# Patient Record
Sex: Female | Born: 1955
Health system: Southern US, Community
[De-identification: ages and names within clinical notes are randomized; demographics above are authoritative.]

## PROBLEM LIST (undated history)

## (undated) DIAGNOSIS — A048 Other specified bacterial intestinal infections: Secondary | ICD-10-CM

## (undated) DIAGNOSIS — K219 Gastro-esophageal reflux disease without esophagitis: Secondary | ICD-10-CM

## (undated) DIAGNOSIS — D689 Coagulation defect, unspecified: Secondary | ICD-10-CM

## (undated) DIAGNOSIS — Z8719 Personal history of other diseases of the digestive system: Secondary | ICD-10-CM

## (undated) DIAGNOSIS — H269 Unspecified cataract: Secondary | ICD-10-CM

## (undated) DIAGNOSIS — IMO0002 Reserved for concepts with insufficient information to code with codable children: Secondary | ICD-10-CM

## (undated) DIAGNOSIS — T7840XA Allergy, unspecified, initial encounter: Secondary | ICD-10-CM

## (undated) DIAGNOSIS — F32A Depression, unspecified: Secondary | ICD-10-CM

## (undated) DIAGNOSIS — H04129 Dry eye syndrome of unspecified lacrimal gland: Secondary | ICD-10-CM

## (undated) DIAGNOSIS — R7303 Prediabetes: Secondary | ICD-10-CM

## (undated) DIAGNOSIS — M1990 Unspecified osteoarthritis, unspecified site: Secondary | ICD-10-CM

## (undated) DIAGNOSIS — D649 Anemia, unspecified: Secondary | ICD-10-CM

## (undated) DIAGNOSIS — E039 Hypothyroidism, unspecified: Secondary | ICD-10-CM

## (undated) DIAGNOSIS — J189 Pneumonia, unspecified organism: Secondary | ICD-10-CM

## (undated) HISTORY — PX: ABDOMINAL HYSTERECTOMY: SHX81

## (undated) HISTORY — DX: Hypothyroidism, unspecified: E03.9

## (undated) HISTORY — PX: JOINT REPLACEMENT: SHX530

## (undated) HISTORY — DX: Reserved for concepts with insufficient information to code with codable children: IMO0002

## (undated) HISTORY — DX: Unspecified cataract: H26.9

## (undated) HISTORY — PX: COLONOSCOPY: SHX174

## (undated) HISTORY — DX: Allergy, unspecified, initial encounter: T78.40XA

## (undated) HISTORY — DX: Coagulation defect, unspecified: D68.9

## (undated) HISTORY — PX: EYE SURGERY: SHX253

## (undated) HISTORY — PX: REFRACTIVE SURGERY: SHX103

## (undated) LAB — TSH: TSH: 0.92 (ref 0.41–5.90)

---

## 1997-11-14 ENCOUNTER — Encounter: Payer: Self-pay | Admitting: *Deleted

## 1997-11-14 ENCOUNTER — Ambulatory Visit (HOSPITAL_COMMUNITY): Admission: RE | Admit: 1997-11-14 | Discharge: 1997-11-14 | Payer: Self-pay | Admitting: *Deleted

## 1998-06-27 ENCOUNTER — Encounter: Payer: Self-pay | Admitting: Family Medicine

## 1998-06-27 ENCOUNTER — Ambulatory Visit (HOSPITAL_COMMUNITY): Admission: RE | Admit: 1998-06-27 | Discharge: 1998-06-27 | Payer: Self-pay | Admitting: Family Medicine

## 1998-08-07 ENCOUNTER — Other Ambulatory Visit: Admission: RE | Admit: 1998-08-07 | Discharge: 1998-08-07 | Payer: Self-pay | Admitting: *Deleted

## 1998-08-11 ENCOUNTER — Emergency Department (HOSPITAL_COMMUNITY): Admission: EM | Admit: 1998-08-11 | Discharge: 1998-08-11 | Payer: Self-pay | Admitting: Emergency Medicine

## 1998-11-26 ENCOUNTER — Ambulatory Visit (HOSPITAL_COMMUNITY): Admission: RE | Admit: 1998-11-26 | Discharge: 1998-11-26 | Payer: Self-pay | Admitting: *Deleted

## 1998-11-26 ENCOUNTER — Encounter: Payer: Self-pay | Admitting: *Deleted

## 1999-09-12 ENCOUNTER — Other Ambulatory Visit: Admission: RE | Admit: 1999-09-12 | Discharge: 1999-09-12 | Payer: Self-pay | Admitting: *Deleted

## 2000-09-24 ENCOUNTER — Other Ambulatory Visit: Admission: RE | Admit: 2000-09-24 | Discharge: 2000-09-24 | Payer: Self-pay | Admitting: *Deleted

## 2000-12-31 ENCOUNTER — Ambulatory Visit (HOSPITAL_COMMUNITY): Admission: RE | Admit: 2000-12-31 | Discharge: 2000-12-31 | Payer: Self-pay | Admitting: *Deleted

## 2001-10-19 ENCOUNTER — Other Ambulatory Visit: Admission: RE | Admit: 2001-10-19 | Discharge: 2001-10-19 | Payer: Self-pay | Admitting: *Deleted

## 2002-11-15 ENCOUNTER — Other Ambulatory Visit: Admission: RE | Admit: 2002-11-15 | Discharge: 2002-11-15 | Payer: Self-pay | Admitting: Obstetrics & Gynecology

## 2004-02-04 ENCOUNTER — Other Ambulatory Visit: Admission: RE | Admit: 2004-02-04 | Discharge: 2004-02-04 | Payer: Self-pay | Admitting: Obstetrics and Gynecology

## 2005-03-17 ENCOUNTER — Other Ambulatory Visit: Admission: RE | Admit: 2005-03-17 | Discharge: 2005-03-17 | Payer: Self-pay | Admitting: Obstetrics and Gynecology

## 2005-04-08 ENCOUNTER — Encounter: Admission: RE | Admit: 2005-04-08 | Discharge: 2005-04-08 | Payer: Self-pay | Admitting: Obstetrics and Gynecology

## 2007-05-11 ENCOUNTER — Encounter: Admission: RE | Admit: 2007-05-11 | Discharge: 2007-05-11 | Payer: Self-pay | Admitting: Obstetrics and Gynecology

## 2008-05-14 ENCOUNTER — Encounter: Admission: RE | Admit: 2008-05-14 | Discharge: 2008-05-14 | Payer: Self-pay | Admitting: Obstetrics and Gynecology

## 2009-02-11 DIAGNOSIS — N951 Menopausal and female climacteric states: Secondary | ICD-10-CM | POA: Insufficient documentation

## 2010-05-07 DIAGNOSIS — L719 Rosacea, unspecified: Secondary | ICD-10-CM | POA: Insufficient documentation

## 2010-05-07 DIAGNOSIS — E039 Hypothyroidism, unspecified: Secondary | ICD-10-CM | POA: Insufficient documentation

## 2010-05-07 DIAGNOSIS — M3500 Sicca syndrome, unspecified: Secondary | ICD-10-CM | POA: Insufficient documentation

## 2010-05-07 DIAGNOSIS — M8580 Other specified disorders of bone density and structure, unspecified site: Secondary | ICD-10-CM | POA: Insufficient documentation

## 2011-08-12 ENCOUNTER — Encounter (INDEPENDENT_AMBULATORY_CARE_PROVIDER_SITE_OTHER): Payer: BC Managed Care – PPO | Admitting: Ophthalmology

## 2011-08-12 DIAGNOSIS — Z09 Encounter for follow-up examination after completed treatment for conditions other than malignant neoplasm: Secondary | ICD-10-CM

## 2011-08-12 DIAGNOSIS — H43819 Vitreous degeneration, unspecified eye: Secondary | ICD-10-CM

## 2011-08-12 DIAGNOSIS — H2510 Age-related nuclear cataract, unspecified eye: Secondary | ICD-10-CM

## 2012-08-15 ENCOUNTER — Ambulatory Visit (INDEPENDENT_AMBULATORY_CARE_PROVIDER_SITE_OTHER): Payer: BC Managed Care – PPO | Admitting: Ophthalmology

## 2012-08-29 DIAGNOSIS — M1711 Unilateral primary osteoarthritis, right knee: Secondary | ICD-10-CM | POA: Diagnosis present

## 2012-08-29 DIAGNOSIS — M1710 Unilateral primary osteoarthritis, unspecified knee: Secondary | ICD-10-CM | POA: Insufficient documentation

## 2012-09-01 ENCOUNTER — Ambulatory Visit (INDEPENDENT_AMBULATORY_CARE_PROVIDER_SITE_OTHER): Payer: BC Managed Care – PPO | Admitting: Ophthalmology

## 2012-09-01 DIAGNOSIS — H43819 Vitreous degeneration, unspecified eye: Secondary | ICD-10-CM

## 2012-09-01 DIAGNOSIS — Z09 Encounter for follow-up examination after completed treatment for conditions other than malignant neoplasm: Secondary | ICD-10-CM

## 2013-08-29 DIAGNOSIS — R600 Localized edema: Secondary | ICD-10-CM | POA: Insufficient documentation

## 2013-09-04 ENCOUNTER — Ambulatory Visit (INDEPENDENT_AMBULATORY_CARE_PROVIDER_SITE_OTHER): Payer: BC Managed Care – PPO | Admitting: Ophthalmology

## 2013-09-14 ENCOUNTER — Ambulatory Visit (INDEPENDENT_AMBULATORY_CARE_PROVIDER_SITE_OTHER): Payer: BC Managed Care – PPO | Admitting: Ophthalmology

## 2013-09-14 DIAGNOSIS — H2510 Age-related nuclear cataract, unspecified eye: Secondary | ICD-10-CM

## 2013-09-14 DIAGNOSIS — H43819 Vitreous degeneration, unspecified eye: Secondary | ICD-10-CM

## 2013-09-14 DIAGNOSIS — Z09 Encounter for follow-up examination after completed treatment for conditions other than malignant neoplasm: Secondary | ICD-10-CM

## 2014-01-12 HISTORY — PX: COLONOSCOPY: SHX174

## 2014-08-31 DIAGNOSIS — Z800 Family history of malignant neoplasm of digestive organs: Secondary | ICD-10-CM | POA: Insufficient documentation

## 2014-09-18 ENCOUNTER — Ambulatory Visit (INDEPENDENT_AMBULATORY_CARE_PROVIDER_SITE_OTHER): Payer: BC Managed Care – PPO | Admitting: Ophthalmology

## 2014-10-16 ENCOUNTER — Ambulatory Visit (INDEPENDENT_AMBULATORY_CARE_PROVIDER_SITE_OTHER): Payer: 59 | Admitting: Ophthalmology

## 2014-10-16 DIAGNOSIS — H2513 Age-related nuclear cataract, bilateral: Secondary | ICD-10-CM | POA: Diagnosis not present

## 2014-10-16 DIAGNOSIS — H43813 Vitreous degeneration, bilateral: Secondary | ICD-10-CM

## 2014-10-16 DIAGNOSIS — M3501 Sicca syndrome with keratoconjunctivitis: Secondary | ICD-10-CM | POA: Diagnosis not present

## 2015-02-14 LAB — POCT ERYTHROCYTE SEDIMENTATION RATE, NON-AUTOMATED: Sed Rate: 1

## 2015-04-23 DIAGNOSIS — R6882 Decreased libido: Secondary | ICD-10-CM | POA: Diagnosis not present

## 2015-04-24 DIAGNOSIS — Z79899 Other long term (current) drug therapy: Secondary | ICD-10-CM | POA: Diagnosis not present

## 2015-04-24 DIAGNOSIS — M170 Bilateral primary osteoarthritis of knee: Secondary | ICD-10-CM | POA: Diagnosis not present

## 2015-04-24 DIAGNOSIS — R768 Other specified abnormal immunological findings in serum: Secondary | ICD-10-CM | POA: Diagnosis not present

## 2015-04-24 DIAGNOSIS — M810 Age-related osteoporosis without current pathological fracture: Secondary | ICD-10-CM | POA: Diagnosis not present

## 2015-04-24 DIAGNOSIS — M3500 Sicca syndrome, unspecified: Secondary | ICD-10-CM | POA: Diagnosis not present

## 2015-04-29 LAB — BASIC METABOLIC PANEL
BUN: 16 (ref 4–21)
Creatinine: 1 (ref 0.5–1.1)
Glucose: 106
Potassium: 4.6 (ref 3.4–5.3)
Sodium: 140 (ref 137–147)

## 2015-04-29 LAB — CBC AND DIFFERENTIAL
HCT: 41 (ref 36–46)
Hemoglobin: 13.5 (ref 12.0–16.0)
Platelets: 341 (ref 150–399)
WBC: 7.3

## 2015-04-29 LAB — HEPATIC FUNCTION PANEL
ALT: 52 — AB (ref 7–35)
AST: 27 (ref 13–35)
Alkaline Phosphatase: 74 (ref 25–125)
Bilirubin, Total: 0

## 2015-06-11 DIAGNOSIS — R6882 Decreased libido: Secondary | ICD-10-CM | POA: Diagnosis not present

## 2015-06-28 LAB — HEPATIC FUNCTION PANEL
ALT: 33 (ref 7–35)
AST: 22 (ref 13–35)
Alkaline Phosphatase: 72 (ref 25–125)
Bilirubin, Total: 0.3

## 2015-06-28 LAB — BASIC METABOLIC PANEL
BUN: 17 (ref 4–21)
Creatinine: 0.9 (ref 0.5–1.1)
Glucose: 104

## 2015-06-28 LAB — CBC AND DIFFERENTIAL
HCT: 42 (ref 36–46)
Hemoglobin: 13.9 (ref 12.0–16.0)
Platelets: 377 (ref 150–399)
WBC: 7.9

## 2015-07-24 DIAGNOSIS — Z79899 Other long term (current) drug therapy: Secondary | ICD-10-CM | POA: Diagnosis not present

## 2015-07-24 DIAGNOSIS — M3500 Sicca syndrome, unspecified: Secondary | ICD-10-CM | POA: Diagnosis not present

## 2015-07-24 DIAGNOSIS — R768 Other specified abnormal immunological findings in serum: Secondary | ICD-10-CM | POA: Diagnosis not present

## 2015-07-24 DIAGNOSIS — M810 Age-related osteoporosis without current pathological fracture: Secondary | ICD-10-CM | POA: Diagnosis not present

## 2015-07-24 DIAGNOSIS — M170 Bilateral primary osteoarthritis of knee: Secondary | ICD-10-CM | POA: Diagnosis not present

## 2015-07-29 LAB — HEPATIC FUNCTION PANEL
ALT: 32 (ref 7–35)
AST: 17 (ref 13–35)
Alkaline Phosphatase: 73 (ref 25–125)
Bilirubin, Total: 0.2

## 2015-07-29 LAB — BASIC METABOLIC PANEL
BUN: 21 (ref 4–21)
Glucose: 119
Potassium: 4.2 (ref 3.4–5.3)
Sodium: 143 (ref 137–147)

## 2015-07-29 LAB — CBC AND DIFFERENTIAL
HCT: 42 (ref 36–46)
Hemoglobin: 14 (ref 12.0–16.0)
Platelets: 352 (ref 150–399)
WBC: 8.1

## 2015-07-29 LAB — POCT ERYTHROCYTE SEDIMENTATION RATE, NON-AUTOMATED: Sed Rate: 1

## 2015-07-31 DIAGNOSIS — R6882 Decreased libido: Secondary | ICD-10-CM | POA: Diagnosis not present

## 2015-10-17 ENCOUNTER — Ambulatory Visit (INDEPENDENT_AMBULATORY_CARE_PROVIDER_SITE_OTHER): Payer: BLUE CROSS/BLUE SHIELD | Admitting: Ophthalmology

## 2015-10-17 DIAGNOSIS — H43813 Vitreous degeneration, bilateral: Secondary | ICD-10-CM

## 2015-10-17 DIAGNOSIS — M069 Rheumatoid arthritis, unspecified: Secondary | ICD-10-CM | POA: Diagnosis not present

## 2015-10-24 DIAGNOSIS — M810 Age-related osteoporosis without current pathological fracture: Secondary | ICD-10-CM | POA: Diagnosis not present

## 2015-10-24 DIAGNOSIS — M170 Bilateral primary osteoarthritis of knee: Secondary | ICD-10-CM | POA: Diagnosis not present

## 2015-10-24 DIAGNOSIS — M3500 Sicca syndrome, unspecified: Secondary | ICD-10-CM | POA: Diagnosis not present

## 2015-10-24 DIAGNOSIS — R768 Other specified abnormal immunological findings in serum: Secondary | ICD-10-CM | POA: Diagnosis not present

## 2015-10-28 LAB — CBC AND DIFFERENTIAL
HCT: 42 (ref 36–46)
Hemoglobin: 14.1 (ref 12.0–16.0)
Platelets: 366 (ref 150–399)
WBC: 8.2

## 2015-10-28 LAB — POCT ERYTHROCYTE SEDIMENTATION RATE, NON-AUTOMATED: Sed Rate: 2

## 2015-10-28 LAB — BASIC METABOLIC PANEL
BUN: 19 (ref 4–21)
Creatinine: 0.9 (ref 0.5–1.1)
Glucose: 104
Potassium: 4.5 (ref 3.4–5.3)
Sodium: 143 (ref 137–147)

## 2015-10-28 LAB — HEPATIC FUNCTION PANEL
ALT: 114 — AB (ref 7–35)
AST: 53 — AB (ref 13–35)
Alkaline Phosphatase: 107 (ref 25–125)
Bilirubin, Total: 0.2

## 2015-11-14 DIAGNOSIS — R6882 Decreased libido: Secondary | ICD-10-CM | POA: Diagnosis not present

## 2015-11-22 DIAGNOSIS — E039 Hypothyroidism, unspecified: Secondary | ICD-10-CM | POA: Diagnosis not present

## 2015-11-22 DIAGNOSIS — Z1159 Encounter for screening for other viral diseases: Secondary | ICD-10-CM | POA: Diagnosis not present

## 2015-11-22 DIAGNOSIS — M3500 Sicca syndrome, unspecified: Secondary | ICD-10-CM | POA: Diagnosis not present

## 2015-11-22 DIAGNOSIS — Z0000 Encounter for general adult medical examination without abnormal findings: Secondary | ICD-10-CM | POA: Diagnosis not present

## 2015-12-23 DIAGNOSIS — Z1231 Encounter for screening mammogram for malignant neoplasm of breast: Secondary | ICD-10-CM | POA: Diagnosis not present

## 2015-12-23 DIAGNOSIS — Z6831 Body mass index (BMI) 31.0-31.9, adult: Secondary | ICD-10-CM | POA: Diagnosis not present

## 2015-12-23 DIAGNOSIS — Z01419 Encounter for gynecological examination (general) (routine) without abnormal findings: Secondary | ICD-10-CM | POA: Diagnosis not present

## 2016-01-23 DIAGNOSIS — Z79899 Other long term (current) drug therapy: Secondary | ICD-10-CM | POA: Diagnosis not present

## 2016-01-23 DIAGNOSIS — M3500 Sicca syndrome, unspecified: Secondary | ICD-10-CM | POA: Diagnosis not present

## 2016-01-23 DIAGNOSIS — R768 Other specified abnormal immunological findings in serum: Secondary | ICD-10-CM | POA: Diagnosis not present

## 2016-01-23 DIAGNOSIS — M170 Bilateral primary osteoarthritis of knee: Secondary | ICD-10-CM | POA: Diagnosis not present

## 2016-01-23 DIAGNOSIS — M810 Age-related osteoporosis without current pathological fracture: Secondary | ICD-10-CM | POA: Diagnosis not present

## 2016-01-23 LAB — HEPATIC FUNCTION PANEL
ALT: 62 — AB (ref 7–35)
AST: 34 (ref 13–35)
Alkaline Phosphatase: 119 (ref 25–125)
Bilirubin, Total: 0.3

## 2016-01-23 LAB — CBC AND DIFFERENTIAL
HCT: 40 (ref 36–46)
Hemoglobin: 13.3 (ref 12.0–16.0)
Platelets: 383 (ref 150–399)
WBC: 6.8

## 2016-01-23 LAB — BASIC METABOLIC PANEL
BUN: 16 (ref 4–21)
Creatinine: 0.9 (ref 0.5–1.1)
Glucose: 112
Potassium: 4.1 (ref 3.4–5.3)
Sodium: 140 (ref 137–147)

## 2016-01-23 LAB — POCT ERYTHROCYTE SEDIMENTATION RATE, NON-AUTOMATED: Sed Rate: 2

## 2016-02-11 DIAGNOSIS — R6882 Decreased libido: Secondary | ICD-10-CM | POA: Diagnosis not present

## 2016-04-22 DIAGNOSIS — M3500 Sicca syndrome, unspecified: Secondary | ICD-10-CM | POA: Diagnosis not present

## 2016-04-22 DIAGNOSIS — R6882 Decreased libido: Secondary | ICD-10-CM | POA: Diagnosis not present

## 2016-04-22 DIAGNOSIS — M810 Age-related osteoporosis without current pathological fracture: Secondary | ICD-10-CM | POA: Diagnosis not present

## 2016-04-22 DIAGNOSIS — R768 Other specified abnormal immunological findings in serum: Secondary | ICD-10-CM | POA: Diagnosis not present

## 2016-04-22 DIAGNOSIS — M170 Bilateral primary osteoarthritis of knee: Secondary | ICD-10-CM | POA: Diagnosis not present

## 2016-04-22 LAB — BASIC METABOLIC PANEL
BUN: 15 (ref 4–21)
Creatinine: 0.8 (ref 0.5–1.1)
Glucose: 96
Potassium: 4.4 (ref 3.4–5.3)
Sodium: 141 (ref 137–147)

## 2016-04-22 LAB — POCT ERYTHROCYTE SEDIMENTATION RATE, NON-AUTOMATED: Sed Rate: 3

## 2016-04-22 LAB — CBC AND DIFFERENTIAL
HCT: 38 (ref 36–46)
Hemoglobin: 13 (ref 12.0–16.0)
Platelets: 385 (ref 150–399)
WBC: 4.6

## 2016-04-22 LAB — HEPATIC FUNCTION PANEL
ALT: 48 — AB (ref 7–35)
AST: 31 (ref 13–35)
Alkaline Phosphatase: 122 (ref 25–125)
Bilirubin, Total: 0.4

## 2016-07-09 DIAGNOSIS — R6882 Decreased libido: Secondary | ICD-10-CM | POA: Diagnosis not present

## 2016-07-24 DIAGNOSIS — M3500 Sicca syndrome, unspecified: Secondary | ICD-10-CM | POA: Diagnosis not present

## 2016-07-24 DIAGNOSIS — M810 Age-related osteoporosis without current pathological fracture: Secondary | ICD-10-CM | POA: Diagnosis not present

## 2016-07-24 DIAGNOSIS — R768 Other specified abnormal immunological findings in serum: Secondary | ICD-10-CM | POA: Diagnosis not present

## 2016-07-24 DIAGNOSIS — M170 Bilateral primary osteoarthritis of knee: Secondary | ICD-10-CM | POA: Diagnosis not present

## 2016-08-17 DIAGNOSIS — E039 Hypothyroidism, unspecified: Secondary | ICD-10-CM | POA: Diagnosis not present

## 2016-09-24 DIAGNOSIS — R6882 Decreased libido: Secondary | ICD-10-CM | POA: Diagnosis not present

## 2016-10-16 ENCOUNTER — Ambulatory Visit (INDEPENDENT_AMBULATORY_CARE_PROVIDER_SITE_OTHER): Payer: BLUE CROSS/BLUE SHIELD | Admitting: Ophthalmology

## 2016-10-16 DIAGNOSIS — M069 Rheumatoid arthritis, unspecified: Secondary | ICD-10-CM | POA: Diagnosis not present

## 2016-10-16 DIAGNOSIS — H2513 Age-related nuclear cataract, bilateral: Secondary | ICD-10-CM | POA: Diagnosis not present

## 2016-10-16 DIAGNOSIS — H43813 Vitreous degeneration, bilateral: Secondary | ICD-10-CM | POA: Diagnosis not present

## 2016-11-24 ENCOUNTER — Ambulatory Visit (INDEPENDENT_AMBULATORY_CARE_PROVIDER_SITE_OTHER): Payer: BLUE CROSS/BLUE SHIELD | Admitting: Family Medicine

## 2016-11-24 ENCOUNTER — Encounter: Payer: Self-pay | Admitting: Family Medicine

## 2016-11-24 VITALS — BP 140/82 | HR 68 | Ht 66.0 in | Wt 179.0 lb

## 2016-11-24 DIAGNOSIS — M1710 Unilateral primary osteoarthritis, unspecified knee: Secondary | ICD-10-CM | POA: Diagnosis not present

## 2016-11-24 DIAGNOSIS — Z803 Family history of malignant neoplasm of breast: Secondary | ICD-10-CM | POA: Diagnosis not present

## 2016-11-24 DIAGNOSIS — Z7189 Other specified counseling: Secondary | ICD-10-CM | POA: Diagnosis not present

## 2016-11-24 DIAGNOSIS — Z8249 Family history of ischemic heart disease and other diseases of the circulatory system: Secondary | ICD-10-CM | POA: Diagnosis not present

## 2016-11-24 DIAGNOSIS — M069 Rheumatoid arthritis, unspecified: Secondary | ICD-10-CM | POA: Diagnosis not present

## 2016-11-24 DIAGNOSIS — Z800 Family history of malignant neoplasm of digestive organs: Secondary | ICD-10-CM | POA: Diagnosis not present

## 2016-11-24 DIAGNOSIS — M179 Osteoarthritis of knee, unspecified: Secondary | ICD-10-CM

## 2016-11-24 DIAGNOSIS — R7989 Other specified abnormal findings of blood chemistry: Secondary | ICD-10-CM | POA: Diagnosis not present

## 2016-11-24 DIAGNOSIS — N951 Menopausal and female climacteric states: Secondary | ICD-10-CM | POA: Diagnosis not present

## 2016-11-24 DIAGNOSIS — Z0000 Encounter for general adult medical examination without abnormal findings: Secondary | ICD-10-CM

## 2016-11-24 DIAGNOSIS — Z833 Family history of diabetes mellitus: Secondary | ICD-10-CM

## 2016-11-24 DIAGNOSIS — M8580 Other specified disorders of bone density and structure, unspecified site: Secondary | ICD-10-CM

## 2016-11-24 DIAGNOSIS — E039 Hypothyroidism, unspecified: Secondary | ICD-10-CM | POA: Diagnosis not present

## 2016-11-24 NOTE — Assessment & Plan Note (Signed)
She will continue to have this managed by her OB/GYN

## 2016-11-24 NOTE — Assessment & Plan Note (Signed)
After discussion we decided Dr. Milton Ferguson, her GYN will continue to manage her hypothyroidism along with her testosterone injections and levels.

## 2016-11-24 NOTE — Patient Instructions (Signed)

## 2016-11-24 NOTE — Progress Notes (Signed)
New patient office visit note:  Impression and Recommendations:    1. Encounter for wellness examination   2. Counseling on health promotion and disease prevention   3. Family history of diabetes mellitus in mother   4. Family history of colon cancer in father   5. Family history of breast cancer in sister   7. Family history of essential hypertension   7. Acquired hypothyroidism   8. Osteoarthritis of knee, unspecified laterality, unspecified osteoarthritis type   9. Osteopenia, unspecified location   10. Rheumatoid arthritis, involving unspecified site, unspecified rheumatoid factor presence (HCC)   11. Menopausal hot flushes   12. Low testosterone level in female     There are no diagnoses linked to this encounter.  There are no diagnoses linked to this encounter.  Acquired hypothyroidism After discussion we decided Dr. Milton Ferguson, her GYN will continue to manage her hypothyroidism along with her testosterone injections and levels.  Low testosterone level in female She will continue to have this managed by her OB/GYN    Education and routine counseling performed. Handouts provided.  Orders Placed This Encounter  Procedures  . CBC with Differential/Platelet  . Comprehensive metabolic panel  . Hemoglobin A1c  . Lipid panel  . VITAMIN D 25 Hydroxy (Vit-D Deficiency, Fractures)    Gross side effects, risk and benefits, and alternatives of medications discussed with patient.  Patient is aware that all medications have potential side effects and we are unable to predict every side effect or drug-drug interaction that may occur.  Expresses verbal understanding and consents to current therapy plan and treatment regimen.  Return for near future for fasting labs and then in 57mo chronic f/up.  Please see AVS handed out to patient at the end of our visit for further patient instructions/ counseling done pertaining to today's office visit.    Note: This document was  prepared using Dragon voice recognition software and may include unintentional dictation errors.  ----------------------------------------------------------------------------------------------------------------------    Subjective:    Chief complaint:   Chief Complaint  Patient presents with  . Establish Care    HPI: Stacy Boyd is a pleasant 61 y.o. female who presents to [facility] today to review their medical history with me and establish care.   I asked the patient to review their chronic problem list with me to ensure everything was updated and accurate.    All recent office visits with other providers, any medical records that patient brought in etc  - I reviewed today.     Also asked pt to get me medical records from [facility] providers/ specialists that they had seen within the past 3-5 years- if they are in private practice and/or do not work for a [employer], [facility], [facility], [facility] or [employer] owned practice.  Told them to call their specialists to clarify this if they are not sure.   See's dr Eustaquio Boyden-  GYN; gives pt T injections, thyroid meds, d/c HRT 5-6 yrs ago.  S-e of pimples and greasey hair. Takes it 4 times/ yr  Mid- 48's HYST for fibroids.     Insomnia- ambien prn for 85yrs or so.  Tried- no other meds besides lunesta but didn;t work as well.   Roseacea-  saw Derm once in remote history.    Rheum-  Dr syed; pred, plaquenil for Sjogren's syn.  Started pt on MTX- thought pt had RA.  D/c MTX recently due to N/V back in June or so.  Fosamax for short period due to taking prednisone.  Pt took it for 5 yrs, off for about 2 or so.   Retinal specialist Dr Jerolyn Center.   Toxicity from plaquenil- d/c ed and all clear.  Eye- doc- [place]  -HYPOTHYROIDISM":  Patient saw Dr. Milton Ferguson approximately 3 months ago and because TSH was around 4 they increased her dose at that time.  Patient has been experiencing some fatigue and tiring a little more easily  than usual.  She has not had that rechecked.  First diagnosed 9 years ago.  She gets her medicine a [employer]. Next appt- next month.    Problem  Acquired Hypothyroidism  Rheumatoid Arthritis (Hcc)  Low Testosterone Level in Female      Wt Readings from Last 3 Encounters:  11/24/16 179 lb (81.2 kg)   BP Readings from Last 3 Encounters:  11/24/16 140/82   Pulse Readings from Last 3 Encounters:  11/24/16 68   BMI Readings from Last 3 Encounters:  11/24/16 28.89 kg/m    Patient Care Team    Relationship Specialty Notifications Start End  Patient, No Pcp Per PCP - General General Practice  10/16/16     Patient Active Problem List   Diagnosis Date Noted  . Acquired hypothyroidism 05/07/2010    Priority: High  . Rheumatoid arthritis (HCC) 11/24/2016  . Low testosterone level in female 11/24/2016  . Family history of colon cancer 08/31/2014  . Bilateral leg edema 08/29/2013  . Osteoarthritis, knee 08/29/2012  . Osteopenia 05/07/2010  . Rosacea 05/07/2010  . Sjogren's disease (HCC) 05/07/2010  . Menopausal hot flushes 02/11/2009     Past Medical History:  Diagnosis Date  . Hypothyroid      Past Medical History:  Diagnosis Date  . Hypothyroid      Past Surgical History:  Procedure Laterality Date  . ABDOMINAL HYSTERECTOMY    . CESAREAN SECTION       Family History  Problem Relation Age of Onset  . Cancer Father        colon  . Cancer Sister        breast  . Cancer Brother        prostate     Social History   Substance and Sexual Activity  Drug Use No     Social History   Substance and Sexual Activity  Alcohol Use Yes  . Alcohol/week: 0.6 oz  . Types: 1 Standard drinks or equivalent per week     Social History   Tobacco Use  Smoking Status Never Smoker  Smokeless Tobacco Never Used     Outpatient Encounter Medications as of 11/24/2016  Medication Sig  . acetaminophen (TYLENOL) 325 MG tablet Take 650 mg every  6 (six) hours as needed by mouth.  Marland Kitchen aspirin EC 81 MG tablet Take 1 tablet daily by mouth.  . calcium carbonate (TUMS - DOSED IN MG ELEMENTAL CALCIUM) 500 MG chewable tablet Chew 1 tablet daily by mouth.  . Calcium Carbonate-Vit D-Min (CALCIUM-VITAMIN D-MINERALS) 600-800 MG-UNIT CHEW Chew 1 tablet daily by mouth.  . diclofenac sodium (VOLTAREN) 1 % GEL Apply 2 g 4 (four) times daily topically.  . Melatonin 2.5 MG CAPS Take 1 capsule at bedtime as needed by mouth.  . Multiple Vitamins-Minerals (CENTRUM SILVER 50+WOMEN PO) Take 1 tablet daily by mouth.  . naproxen sodium (ALEVE) 220 MG tablet Take 220 mg daily as needed by mouth.  . Omega-3 Fatty Acids (FISH OIL) 1200 MG CAPS Take 1 capsule daily  by mouth.  Bertram Gala Glycol-Propyl Glycol (SYSTANE) 0.4-0.3 % SOLN Apply daily to eye.  . testosterone cypionate (DEPOTESTOTERONE CYPIONATE) 100 MG/ML injection Inject 100 mg into the muscle. Every 10-12 weeks  . TURMERIC PO Take 1 tablet daily by mouth.  Marland Kitchen UNABLE TO FIND Take 1 capsule daily by mouth. Med Name: liothryonine sodium (t#)/thyroxine-L (T4) SR 21-89 mcg  1 capsule orally daily  . zolpidem (AMBIEN) 5 MG tablet Take 5 mg at bedtime as needed by mouth for sleep.   No facility-administered encounter medications on file as of 11/24/2016.     Allergies: Erythromycin and Influenza vaccines   ROS   Objective:   Blood pressure 140/82, pulse 68, height 5\' 6"  (1.676 m), weight 179 lb (81.2 kg). Body mass index is 28.89 kg/m. General: Well Developed, well nourished, and in no acute distress.  Neuro: Alert and oriented x3, extra-ocular muscles intact, sensation grossly intact.  HEENT:/AT, PERRLA, neck supple, No carotid bruits Skin: no gross rashes  Cardiac: Regular rate and rhythm Respiratory: Essentially clear to auscultation bilaterally. Not using accessory muscles, speaking in full sentences.  Abdominal: not grossly distended Musculoskeletal: Ambulates w/o diff, FROM * 4 ext.    Vasc: less 2 sec cap RF, warm and pink  Psych:  No HI/SI, judgement and insight good, Euthymic mood. Full Affect.    No results found for this or any previous visit (from the past 2160 hour(s)).

## 2016-11-27 ENCOUNTER — Other Ambulatory Visit: Payer: BLUE CROSS/BLUE SHIELD

## 2016-11-27 DIAGNOSIS — Z833 Family history of diabetes mellitus: Secondary | ICD-10-CM

## 2016-11-27 DIAGNOSIS — Z0000 Encounter for general adult medical examination without abnormal findings: Secondary | ICD-10-CM | POA: Diagnosis not present

## 2016-11-27 DIAGNOSIS — Z800 Family history of malignant neoplasm of digestive organs: Secondary | ICD-10-CM

## 2016-11-27 DIAGNOSIS — Z8249 Family history of ischemic heart disease and other diseases of the circulatory system: Secondary | ICD-10-CM

## 2016-11-27 DIAGNOSIS — Z803 Family history of malignant neoplasm of breast: Secondary | ICD-10-CM | POA: Diagnosis not present

## 2016-11-28 LAB — VITAMIN D 25 HYDROXY (VIT D DEFICIENCY, FRACTURES): Vit D, 25-Hydroxy: 48.2 ng/mL (ref 30.0–100.0)

## 2016-11-28 LAB — COMPREHENSIVE METABOLIC PANEL
ALT: 15 IU/L (ref 0–32)
AST: 18 IU/L (ref 0–40)
Albumin/Globulin Ratio: 1.7 (ref 1.2–2.2)
Albumin: 4 g/dL (ref 3.6–4.8)
Alkaline Phosphatase: 81 IU/L (ref 39–117)
BUN/Creatinine Ratio: 19 (ref 12–28)
BUN: 14 mg/dL (ref 8–27)
Bilirubin Total: 0.2 mg/dL (ref 0.0–1.2)
CO2: 24 mmol/L (ref 20–29)
Calcium: 9.1 mg/dL (ref 8.7–10.3)
Chloride: 103 mmol/L (ref 96–106)
Creatinine, Ser: 0.73 mg/dL (ref 0.57–1.00)
GFR calc Af Amer: 101 mL/min/{1.73_m2} (ref >59)
GFR calc non Af Amer: 88 mL/min/{1.73_m2} (ref >59)
Globulin, Total: 2.3 g/dL (ref 1.5–4.5)
Glucose: 93 mg/dL (ref 65–99)
Potassium: 4.5 mmol/L (ref 3.5–5.2)
Sodium: 140 mmol/L (ref 134–144)
Total Protein: 6.3 g/dL (ref 6.0–8.5)

## 2016-11-28 LAB — CBC WITH DIFFERENTIAL/PLATELET
Basophils Absolute: 0.1 10*3/uL (ref 0.0–0.2)
Basos: 1 %
EOS (ABSOLUTE): 0.7 10*3/uL — ABNORMAL HIGH (ref 0.0–0.4)
Eos: 11 %
Hematocrit: 25.9 % — ABNORMAL LOW (ref 34.0–46.6)
Hemoglobin: 7.4 g/dL — ABNORMAL LOW (ref 11.1–15.9)
Immature Grans (Abs): 0 10*3/uL (ref 0.0–0.1)
Immature Granulocytes: 0 %
Lymphocytes Absolute: 1.8 10*3/uL (ref 0.7–3.1)
Lymphs: 31 %
MCH: 19.2 pg — ABNORMAL LOW (ref 26.6–33.0)
MCHC: 28.6 g/dL — ABNORMAL LOW (ref 31.5–35.7)
MCV: 67 fL — ABNORMAL LOW (ref 79–97)
Monocytes Absolute: 0.7 10*3/uL (ref 0.1–0.9)
Monocytes: 12 %
Neutrophils Absolute: 2.7 10*3/uL (ref 1.4–7.0)
Neutrophils: 45 %
Platelets: 619 10*3/uL — ABNORMAL HIGH (ref 150–379)
RBC: 3.85 x10E6/uL (ref 3.77–5.28)
RDW: 19 % — ABNORMAL HIGH (ref 12.3–15.4)
WBC: 5.9 10*3/uL (ref 3.4–10.8)

## 2016-11-28 LAB — LIPID PANEL
Chol/HDL Ratio: 2.8 ratio (ref 0.0–4.4)
Cholesterol, Total: 189 mg/dL (ref 100–199)
HDL: 68 mg/dL (ref >39)
LDL Calculated: 108 mg/dL — ABNORMAL HIGH (ref 0–99)
Triglycerides: 66 mg/dL (ref 0–149)
VLDL Cholesterol Cal: 13 mg/dL (ref 5–40)

## 2016-11-28 LAB — HEMOGLOBIN A1C
Est. average glucose Bld gHb Est-mCnc: 117 mg/dL
Hgb A1c MFr Bld: 5.7 % — ABNORMAL HIGH (ref 4.8–5.6)

## 2016-12-02 ENCOUNTER — Telehealth: Payer: Self-pay | Admitting: Family Medicine

## 2016-12-02 NOTE — Progress Notes (Signed)
Called the lab to add on test. The reticulocyte count requires the patient to come back in for a blood draw, due to the amount of time since the blood draw. MPulliam, CMA/RT(R)

## 2016-12-02 NOTE — Telephone Encounter (Signed)
Pt cld  States is going to a trail study today and needed her lab results to take with her--  advised  Dr. Sharee Holster has heavy clinic today & would frwd request to medical assistant to review & release if possible today! --glh

## 2016-12-02 NOTE — Telephone Encounter (Signed)
Labs printed and given to Genella Rife.  Tiajuana Amass, CMA

## 2016-12-07 ENCOUNTER — Telehealth: Payer: Self-pay | Admitting: Family Medicine

## 2016-12-07 NOTE — Telephone Encounter (Signed)
Called patient she states that she is not feeling well since starting the iron supplements. She states that she doesn't feel like eating, slight nausea, and lower abdominal pains.  Patient states that states that she was taking tums before starting the iron but has not taken any since starting supplements.  Patient admits that she was taking a large amount of the tums when she was using.  Please advise. MPulliam, CMA/RT(R)

## 2016-12-07 NOTE — Telephone Encounter (Signed)
Patient wants to discuss with someone clinical about her iron that she is taking and some adverse effects that she is having. She can be reached on her mobile today

## 2016-12-08 LAB — IRON AND TIBC
Iron Saturation: 3 % — CL (ref 15–55)
Iron: 12 ug/dL — ABNORMAL LOW (ref 27–139)
Total Iron Binding Capacity: 446 ug/dL (ref 250–450)
UIBC: 434 ug/dL — ABNORMAL HIGH (ref 118–369)

## 2016-12-08 LAB — FERRITIN: Ferritin: 6 ng/mL — ABNORMAL LOW (ref 15–150)

## 2016-12-08 LAB — SPECIMEN STATUS REPORT

## 2016-12-08 LAB — B12 AND FOLATE PANEL
Folate: >20 ng/mL (ref >3.0)
Vitamin B-12: 469 pg/mL (ref 232–1245)

## 2016-12-08 NOTE — Telephone Encounter (Signed)
Spoke with Dr. Sharee Holster per her instructions, patient advised to take medication closer to meals and if symptoms continue to see if she can find the coated, extended release tablets.  Patient expressed understanding and states that she feels better today. MPulliam, CMA/RT(R)

## 2016-12-09 ENCOUNTER — Telehealth: Payer: Self-pay | Admitting: Family Medicine

## 2016-12-09 NOTE — Telephone Encounter (Signed)
Noted MPulliam, CMA/RT(R)  

## 2016-12-09 NOTE — Telephone Encounter (Signed)
Pt called states she was ask to contact Melissa and report if any problems taking medicine-- Pt tolerating well. --glh

## 2016-12-14 DIAGNOSIS — R6882 Decreased libido: Secondary | ICD-10-CM | POA: Diagnosis not present

## 2016-12-16 LAB — HEPATIC FUNCTION PANEL
ALT: 20 (ref 7–35)
AST: 21 (ref 13–35)
Alkaline Phosphatase: 80 (ref 25–125)
Bilirubin, Total: 0.3

## 2016-12-16 LAB — BASIC METABOLIC PANEL
BUN: 13 (ref 4–21)
Creatinine: 0.7 (ref 0.5–1.1)
Glucose: 92
Potassium: 4.3 (ref 3.4–5.3)
Sodium: 141 (ref 137–147)

## 2016-12-16 LAB — CBC AND DIFFERENTIAL
HCT: 34 — AB (ref 36–46)
Hemoglobin: 10 — AB (ref 12.0–16.0)
Platelets: 413 — AB (ref 150–399)

## 2016-12-16 LAB — HEMOGLOBIN A1C: Hemoglobin A1C: 4.9

## 2016-12-23 ENCOUNTER — Encounter: Payer: Self-pay | Admitting: Family Medicine

## 2016-12-29 DIAGNOSIS — Z01419 Encounter for gynecological examination (general) (routine) without abnormal findings: Secondary | ICD-10-CM | POA: Diagnosis not present

## 2016-12-29 DIAGNOSIS — Z6828 Body mass index (BMI) 28.0-28.9, adult: Secondary | ICD-10-CM | POA: Diagnosis not present

## 2016-12-29 DIAGNOSIS — Z1212 Encounter for screening for malignant neoplasm of rectum: Secondary | ICD-10-CM | POA: Diagnosis not present

## 2016-12-29 DIAGNOSIS — Z1231 Encounter for screening mammogram for malignant neoplasm of breast: Secondary | ICD-10-CM | POA: Diagnosis not present

## 2017-02-01 ENCOUNTER — Ambulatory Visit: Payer: BLUE CROSS/BLUE SHIELD | Admitting: Family Medicine

## 2017-02-16 ENCOUNTER — Ambulatory Visit (INDEPENDENT_AMBULATORY_CARE_PROVIDER_SITE_OTHER): Payer: BLUE CROSS/BLUE SHIELD | Admitting: Family Medicine

## 2017-02-16 ENCOUNTER — Encounter: Payer: Self-pay | Admitting: Gastroenterology

## 2017-02-16 ENCOUNTER — Encounter: Payer: Self-pay | Admitting: Family Medicine

## 2017-02-16 VITALS — BP 148/81 | HR 100 | Ht 66.0 in | Wt 177.3 lb

## 2017-02-16 DIAGNOSIS — M069 Rheumatoid arthritis, unspecified: Secondary | ICD-10-CM | POA: Diagnosis not present

## 2017-02-16 DIAGNOSIS — D509 Iron deficiency anemia, unspecified: Secondary | ICD-10-CM

## 2017-02-16 DIAGNOSIS — D508 Other iron deficiency anemias: Secondary | ICD-10-CM

## 2017-02-16 DIAGNOSIS — M1710 Unilateral primary osteoarthritis, unspecified knee: Secondary | ICD-10-CM

## 2017-02-16 DIAGNOSIS — Z791 Long term (current) use of non-steroidal anti-inflammatories (NSAID): Secondary | ICD-10-CM

## 2017-02-16 DIAGNOSIS — M179 Osteoarthritis of knee, unspecified: Secondary | ICD-10-CM

## 2017-02-16 NOTE — Patient Instructions (Addendum)
We will send patient home with stool cards to do in near future.   We will look for occult GI bleed.    - We will send you to gastroenterology for further workup of your acute onset of iron deficiency anemia with unknown etiology-I am concerned this is secondary to your chronic NSAID use and they may determine you need a upper GI endoscopy.    For your knee pain please stop all NSAIDs due to your anemia.    Please use ice 15 minutes every hour on the front and back of your knees bilaterally.  Due to the severe nature of your tricompartmental arthritis of your knees back in 2016 with your last PCP when they got x-rays, we will send you to orthopedist for further evaluation.  Per your request, referral to Dr. Berton Lan was placed    Iron Deficiency Anemia, Adult Iron deficiency anemia is a condition in which the concentration of red blood cells or hemoglobin in the blood is below normal because of too little iron. Hemoglobin is a substance in red blood cells that carries oxygen to the body's tissues. When the concentration of red blood cells or hemoglobin is too low, not enough oxygen reaches these tissues. Iron deficiency anemia is usually long-lasting (chronic) and it develops over time. It may or may not cause symptoms. It is a common type of anemia. What are the causes? This condition may be caused by:  Not enough iron in the diet.  Blood loss caused by bleeding in the intestine.  Blood loss from a gastrointestinal condition like Crohn disease.  Frequent blood draws, such as from blood donation.  Abnormal absorption in the gut.  Heavy menstrual periods in women.  Cancers of the gastrointestinal system, such as colon cancer.  What are the signs or symptoms? Symptoms of this condition may include:  Fatigue.  Headache.  Pale skin, lips, and nail beds.  Poor appetite.  Weakness.  Shortness of breath.  Dizziness.  Cold hands and feet.  Fast or irregular  heartbeat.  Irritability. This is more common in severe anemia.  Rapid breathing. This is more common in severe anemia.  Mild anemia may not cause any symptoms. How is this diagnosed? This condition is diagnosed based on:  Your medical history.  A physical exam.  Blood tests.  You may have additional tests to find the underlying cause of your anemia, such as:  Testing for blood in the stool (fecal occult blood test).  A procedure to see inside your colon and rectum (colonoscopy).  A procedure to see inside your esophagus and stomach (endoscopy).  A test in which cells are removed from bone marrow (bone marrow aspiration) or fluid is removed from the bone marrow to be examined (biopsy). This is rarely needed.  How is this treated? This condition is treated by correcting the cause of your iron deficiency. Treatment may involve:  Adding iron-rich foods to your diet.  Taking iron supplements. If you are pregnant or breastfeeding, you may need to take extra iron because your normal diet usually does not provide the amount of iron that you need.  Increasing vitamin C intake. Vitamin C helps your body absorb iron. Your health care provider may recommend that you take iron supplements along with a glass of orange juice or a vitamin C supplement.  Medicines to make heavy menstrual flow lighter.  Surgery.  You may need repeat blood tests to determine whether treatment is working. Depending on the underlying cause, the anemia  should be corrected within 2 months of starting treatment. If the treatment does not seem to be working, you may need more testing. Follow these instructions at home: Medicines  Take over-the-counter and prescription medicines only as told by your health care provider. This includes iron supplements and vitamins.  If you cannot tolerate taking iron supplements by mouth, talk with your health care provider about taking them through a vein (intravenously) or an  injection into a muscle.  For the best iron absorption, you should take iron supplements when your stomach is empty. If you cannot tolerate them on an empty stomach, you may need to take them with food.  Do not drink milk or take antacids at the same time as your iron supplements. Milk and antacids may interfere with iron absorption.  Iron supplements can cause constipation. To prevent constipation, include fiber in your diet as told by your health care provider. A stool softener may also be recommended. Eating and drinking  Talk with your health care provider before changing your diet. He or she may recommend that you eat foods that contain a lot of iron, such as: ? Liver. ? Low-fat (lean) beef. ? Breads and cereals that have iron added to them (are fortified). ? Eggs. ? Dried fruit. ? Dark green, leafy vegetables.  To help your body use the iron from iron-rich foods, eat those foods at the same time as fresh fruits and vegetables that are high in vitamin C. Foods that are high in vitamin C include: ? Oranges. ? Peppers. ? Tomatoes. ? Mangoes.  Drinkenoughfluid to keep your urine clear or pale yellow. General instructions  Return to your normal activities as told by your health care provider. Ask your health care provider what activities are safe for you.  Practice good hygiene. Anemia can make you more prone to illness and infection.  Keep all follow-up visits as told by your health care provider. This is important. Contact a health care provider if:  You feel nauseous or you vomit.  You feel weak.  You have unexplained sweating.  You develop symptoms of constipation, such as: ? Having fewer than three bowel movements a week. ? Straining to have a bowel movement. ? Having stools that are hard, dry, or larger than normal. ? Feeling full or bloated. ? Pain in the lower abdomen. ? Not feeling relief after having a bowel movement. Get help right away if:  You faint. If  this happens, do not drive yourself to the hospital. Call your local emergency services (911 in the [place]  You have chest pain.  You have shortness of breath that: ? Is severe. ? Gets worse with physical activity.  You have a rapid heartbeat.  You become light-headed when getting up from a sitting or lying down position. This information is not intended to replace advice given to you by your health care provider. Make sure you discuss any questions you have with your health care provider. Document Released: 12/27/1999 Document Revised: 09/18/2015 Document Reviewed: 09/18/2015 Elsevier Interactive Patient Education  2018 [employer].

## 2017-02-16 NOTE — Progress Notes (Signed)
Impression and Recommendations:    1. Acquired iron deficiency anemia due to decreased absorption- due to rheum meds and tums etc   2. Rheumatoid arthritis, involving unspecified site, unspecified rheumatoid factor presence (HCC)   3. NSAID long-term use   4. Tricompartment osteoarthritis of knee, unspecified laterality   5. Osteoarthritis of knee, unspecified laterality, unspecified osteoarthritis type    - Pt was in the office today for 35+ minutes, with over 50% time spent in face to face counseling of patients various medical conditions, treatment plans of those medical conditions including medicine management and lifestyle modification, strategies to improve health and well being; and in coordination of care. SEE ABOVE FOR DETAILS  1. Acquired iron deficiency anemia- Pt is compliant at this time and tolerating her supplements well. Continue taking your supplements as prescribed. -Ambulatory referral given to [facility] for further evaluation of acute-onset of iron deficiency anemia.  -Recheck CBC and anemia panel. -stop tylenol, stop aspirin, stop Voltaren gel. -Pt will be sent home with stool cards to do in near future.  2. Rheumatoid arthritis- Ambulatory referral given to orthopedist.  3. NSAID long-term use- stop NSAID use until discussed with GI.  4. Tricompartment OA of knee- Pt asked for ambulatory referral to orthopedist.  -ice (on top and bottom of) your knee for 15-20 minutes, every day.  5. OA of knee- Strongly preferred pt to not use voltaren gel or NSAIDs due to her acquired iron deficiency anemia.  -Follow up in 2 months to discuss chronic care management.   Orders Placed This Encounter  Procedures  . Vitamin B12  . Folate  . Iron and TIBC  . Ferritin  . CBC with Differential/Platelet  . Ambulatory referral to Gastroenterology  . Ambulatory referral to Orthopedic Surgery    Gross side effects, risk and benefits, and alternatives of medications and  treatment plan in general discussed with patient.  Patient is aware that all medications have potential side effects and we are unable to predict every side effect or drug-drug interaction that may occur.   Patient will call with any questions prior to using medication if they have concerns.  Expresses verbal understanding and consents to current therapy and treatment regimen.  No barriers to understanding were identified.  Red flag symptoms and signs discussed in detail.  Patient expressed understanding regarding what to do in case of emergency\urgent symptoms  Please see AVS handed out to patient at the end of our visit for further patient instructions/ counseling done pertaining to today's office visit.   Return for 2 mo- f/up chronic conditions.    Pt was in the office today for 40+ minutes, with over 50% time spent in face to face counseling of patients various medical conditions, treatment plans of those medical conditions including medicine management and lifestyle modification, strategies to improve health and well being; and in coordination of care. SEE ABOVE FOR DETAILS  Note: This note was prepared with assistance of Dragon voice recognition software. Occasional wrong-word or sound-a-like substitutions may have occurred due to the inherent limitations of voice recognition software.  This document serves as a record of services personally performed by Thomasene Lot, DO. It was created on her behalf by Thelma Barge, a trained medical scribe. The creation of this record is based on the scribe's personal observations and the provider's statements to them.   I have reviewed the above medical documentation for accuracy and completeness and I concur.  Thomasene Lot 03/17/17 2:50  PM  --------------------------------------------------------------------------------------------------------------------------------------------------------------------------------------------------------------------------------------------  Subjective:     HPI: Stacy Boyd is a 62 y.o. female who presents to [facility] today for follow up after being on iron supplementals, as well as her knee pain.  Anemia She takes her iron supplements (325mg  3x/day). She states she got her appetite back after being on the supplements for 2-3 days. She feels good after being on this medication. She is compliant and tolerating this well. She reports feeling increased energy and feeling "normal". She also says she did not know how bad she felt until she felt good. She states initially, she felt "twitchy" and could not sit still, but this has since resolved. She stopped taking Tums in June 2018 and felt fine afterwards. She takes between 1-2 naproxen daily. She takes low dose aspirin and 1 aleve every morning for her rheumatoid arthritis.   Her GI is July 2018, MD.   Her last rectal exam and colonoscopy was 2 years ago with negative results. She had a cologard done in December 2018.    HRT She takes T injections and started several years ago.   Knee pain She has a big "lump" behind her L knee. [facility] did an XRAY in December 2018 and was diagnosed with osteoarthritis, but these records were not found in our system. She has not seen an orthopedist. She uses voltaren gel and it helps with the pain. She does not ice her knee. It feels like it will give out on her due to pain and she has difficulty walking up stairs.  She had an XRAY from 3 years ago which shows severe joint space narrowing and osteophyte formation. She has not had an MRI.   Wt Readings from Last 3 Encounters:  02/16/17 177 lb 4.8 oz (80.4 kg)  11/24/16 179 lb (81.2 kg)   BP Readings  from Last 3 Encounters:  02/16/17 (!) 148/81  11/24/16 140/82   Pulse Readings from Last 3 Encounters:  02/16/17 100  11/24/16 68   BMI Readings from Last 3 Encounters:  02/16/17 28.62 kg/m  11/24/16 28.89 kg/m     Patient Care Team    Relationship Specialty Notifications Start End  11/26/16, DO PCP - General Family Medicine  11/24/16   11/26/16, MD  Rheumatology  11/24/16   11/26/16, MD Consulting Physician Obstetrics and Gynecology  11/24/16   11/26/16, MD Consulting Physician Gastroenterology  02/16/17      Patient Active Problem List   Diagnosis Date Noted  . Acquired hypothyroidism 05/07/2010    Priority: High  . NSAID long-term use 02/16/2017  . Tricompartment osteoarthritis of knee 02/16/2017  . Acquired iron deficiency anemia due to decreased absorption- due to rheum meds and tums etc 02/16/2017  . Rheumatoid arthritis (HCC) 11/24/2016  . Low testosterone level in female 11/24/2016  . Family history of colon cancer 08/31/2014  . Bilateral leg edema 08/29/2013  . Osteoarthritis, knee 08/29/2012  . Osteopenia 05/07/2010  . Rosacea 05/07/2010  . Sjogren's disease (HCC) 05/07/2010  . Menopausal hot flushes 02/11/2009    Past Medical history, Surgical history, Family history, Social history, Allergies and Medications have been entered into the medical record, reviewed and changed as needed.    Current Meds  Medication Sig  . Calcium Carbonate-Vit D-Min (CALCIUM-VITAMIN D-MINERALS) 600-800 MG-UNIT CHEW Chew 1 tablet daily by mouth.  . ferrous sulfate 325 (65 FE) MG EC tablet Take 325 mg by mouth 3 (three) times daily with meals.  02/13/2009  Melatonin 2.5 MG CAPS Take 1 capsule at bedtime as needed by mouth.  . Multiple Vitamins-Minerals (CENTRUM SILVER 50+WOMEN PO) Take 1 tablet daily by mouth.  . Omega-3 Fatty Acids (FISH OIL) 1200 MG CAPS Take 1 capsule daily by mouth.  Bertram Gala Glycol-Propyl Glycol (SYSTANE) 0.4-0.3 % SOLN Apply daily  to eye.  Marland Kitchen SPIRULINA PO Take 1 tablet by mouth daily.  Marland Kitchen testosterone cypionate (DEPOTESTOTERONE CYPIONATE) 100 MG/ML injection Inject 100 mg into the muscle. Every 10-12 weeks  . TURMERIC PO Take 1 tablet daily by mouth.  Marland Kitchen UNABLE TO FIND Take 1 capsule daily by mouth. Med Name: liothryonine sodium (t#)/thyroxine-L (T4) SR 21-89 mcg  1 capsule orally daily  . zolpidem (AMBIEN) 5 MG tablet Take 5 mg at bedtime as needed by mouth for sleep.  . [DISCONTINUED] aspirin EC 81 MG tablet Take 1 tablet daily by mouth.  . [DISCONTINUED] diclofenac sodium (VOLTAREN) 1 % GEL Apply 2 g 4 (four) times daily topically.  . [DISCONTINUED] naproxen sodium (ALEVE) 220 MG tablet Take 220 mg daily as needed by mouth.    Allergies:  Allergies  Allergen Reactions  . Erythromycin Hives and Nausea Only  . Influenza Vaccines Hives    Review of Systems:  A fourteen system review of systems was performed and found to be positive as per HPI.   Objective:   Blood pressure (!) 148/81, pulse 100, height 5\' 6"  (1.676 m), weight 177 lb 4.8 oz (80.4 kg), SpO2 98 %. Body mass index is 28.62 kg/m. General:  Well Developed, well nourished, appropriate for stated age.  Neuro:  Alert and oriented,  extra-ocular muscles intact  HEENT:  Normocephalic, atraumatic, neck supple, no carotid bruits appreciated  Skin:  no gross rash, warm, pink. Cardiac:  RRR, S1 S2 Respiratory:  ECTA B/L and A/P, Not using accessory muscles, speaking in full sentences- unlabored. Vascular:  Ext warm, no cyanosis apprec.; cap RF less 2 sec. Psych:  No HI/SI, judgement and insight good, Euthymic mood. Full Affect.

## 2017-02-17 LAB — FOLATE: Folate: 20 ng/mL (ref >3.0)

## 2017-02-17 LAB — FERRITIN: Ferritin: 65 ng/mL (ref 15–150)

## 2017-02-17 LAB — IRON AND TIBC
Iron Saturation: 36 % (ref 15–55)
Iron: 118 ug/dL (ref 27–139)
Total Iron Binding Capacity: 331 ug/dL (ref 250–450)
UIBC: 213 ug/dL (ref 118–369)

## 2017-02-17 LAB — VITAMIN B12: Vitamin B-12: 819 pg/mL (ref 232–1245)

## 2017-02-22 ENCOUNTER — Telehealth: Payer: Self-pay | Admitting: Family Medicine

## 2017-02-22 NOTE — Telephone Encounter (Signed)
Patient is requesting results of lab work she had done last week, please advise

## 2017-02-22 NOTE — Telephone Encounter (Signed)
Patient is calling for lab results, please advise. MPulliam, CMA/RT(R)

## 2017-03-03 DIAGNOSIS — M170 Bilateral primary osteoarthritis of knee: Secondary | ICD-10-CM | POA: Diagnosis not present

## 2017-03-10 DIAGNOSIS — R6882 Decreased libido: Secondary | ICD-10-CM | POA: Diagnosis not present

## 2017-03-10 DIAGNOSIS — E039 Hypothyroidism, unspecified: Secondary | ICD-10-CM | POA: Diagnosis not present

## 2017-03-12 DIAGNOSIS — M7122 Synovial cyst of popliteal space [Baker], left knee: Secondary | ICD-10-CM | POA: Insufficient documentation

## 2017-03-12 DIAGNOSIS — M1712 Unilateral primary osteoarthritis, left knee: Secondary | ICD-10-CM | POA: Diagnosis not present

## 2017-03-31 ENCOUNTER — Other Ambulatory Visit (INDEPENDENT_AMBULATORY_CARE_PROVIDER_SITE_OTHER): Payer: BLUE CROSS/BLUE SHIELD

## 2017-03-31 ENCOUNTER — Encounter: Payer: Self-pay | Admitting: Gastroenterology

## 2017-03-31 ENCOUNTER — Ambulatory Visit: Payer: BLUE CROSS/BLUE SHIELD | Admitting: Gastroenterology

## 2017-03-31 VITALS — Ht 66.5 in | Wt 176.8 lb

## 2017-03-31 DIAGNOSIS — D509 Iron deficiency anemia, unspecified: Secondary | ICD-10-CM | POA: Diagnosis not present

## 2017-03-31 DIAGNOSIS — D508 Other iron deficiency anemias: Secondary | ICD-10-CM

## 2017-03-31 LAB — CBC WITH DIFFERENTIAL/PLATELET
Basophils Absolute: 0.1 10*3/uL (ref 0.0–0.1)
Basophils Relative: 1.1 % (ref 0.0–3.0)
Eosinophils Absolute: 0.2 10*3/uL (ref 0.0–0.7)
Eosinophils Relative: 2.4 % (ref 0.0–5.0)
HCT: 48.6 % — ABNORMAL HIGH (ref 36.0–46.0)
Hemoglobin: 16.4 g/dL — ABNORMAL HIGH (ref 12.0–15.0)
Lymphocytes Relative: 20.4 % (ref 12.0–46.0)
Lymphs Abs: 1.5 10*3/uL (ref 0.7–4.0)
MCHC: 33.8 g/dL (ref 30.0–36.0)
MCV: 93.9 fl (ref 78.0–100.0)
Monocytes Absolute: 0.6 10*3/uL (ref 0.1–1.0)
Monocytes Relative: 8 % (ref 3.0–12.0)
Neutro Abs: 5.1 10*3/uL (ref 1.4–7.7)
Neutrophils Relative %: 68.1 % (ref 43.0–77.0)
Platelets: 307 10*3/uL (ref 150.0–400.0)
RBC: 5.18 Mil/uL — ABNORMAL HIGH (ref 3.87–5.11)
RDW: 13.6 % (ref 11.5–15.5)
WBC: 7.6 10*3/uL (ref 4.0–10.5)

## 2017-03-31 NOTE — Progress Notes (Signed)
HPI: This is a  very pleasant 62 year old woman who was referred to me by Thomasene Lot, DO  to evaluate iron deficiency anemia.    Chief complaint is iron deficiency anemia  Old Data Reviewed: Colonoscopy; September 2016, Dr. Bosie Clos at [facility].  Indications "colorectal cancer in father, last colonoscopy June 2010".  Findings internal hemorrhoids.  The examination was otherwise normal.  The terminal ileum was normal.  He recommended that she have repeat colonoscopy at 5 years (would be 09/2019)  Colonoscopy June 2010, Dr. Bosie Clos.  Indications "colon cancer in father, 101 year old or older".  Examination was completely normal and he recommended repeat colonoscopy at 5-year interval  She was found to have significant microcytic anemia about 3 months ago with a hemoglobin in the sevens, MCV in the 60s.  This was quite different than her he a normal hemoglobin about a year ago.  Iron studies were all low, low ferritin 6, low total iron 12.  She was started on oral iron supplement, 3 times daily.  B12 and folate levels checked in the past month or 2 were all normal.  Labs Hb 10 (12/2016)  Had been taking methotrexate, increasing doses (started March 2017, stopped it June of 2018).  Was taking this because of possible RA.  Takes ASA low dose for years.  Was taking NSAIDs: alleve one daily, and tylenol PRN.  Currently iron OTC TID.  Vegetarian for 12 years.  Eats a lot of beans, lentils, spinach.  25 pounds down in a year.   Review of systems: Pertinent positive and negative review of systems were noted in the above HPI section. All other review negative.   Past Medical History:  Diagnosis Date  . Hypothyroid     Past Surgical History:  Procedure Laterality Date  . ABDOMINAL HYSTERECTOMY    . CESAREAN SECTION      Current Outpatient Medications  Medication Sig Dispense Refill  . ferrous sulfate 325 (65 FE) MG EC tablet Take 325 mg by mouth 3 (three) times daily with  meals.    . Melatonin 2.5 MG CAPS Take 1 capsule at bedtime as needed by mouth.    . Multiple Vitamins-Minerals (CENTRUM SILVER 50+WOMEN PO) Take 1 tablet daily by mouth.    . Omega-3 Fatty Acids (FISH OIL) 1200 MG CAPS Take 1 capsule daily by mouth.    Bertram Gala Glycol-Propyl Glycol (SYSTANE) 0.4-0.3 % SOLN Apply daily to eye.    Marland Kitchen SPIRULINA PO Take 1 tablet by mouth daily.    Marland Kitchen testosterone cypionate (DEPOTESTOTERONE CYPIONATE) 100 MG/ML injection Inject 100 mg into the muscle. Every 10-12 weeks    . TURMERIC PO Take 1 tablet daily by mouth.    Marland Kitchen UNABLE TO FIND Take 1 capsule daily by mouth. Med Name: liothryonine sodium (t#)/thyroxine-L (T4) SR 21-89 mcg  1 capsule orally daily    . zolpidem (AMBIEN) 5 MG tablet Take 5 mg at bedtime as needed by mouth for sleep.     No current facility-administered medications for this visit.     Allergies as of 03/31/2017 - Review Complete 03/31/2017  Allergen Reaction Noted  . Erythromycin Hives and Nausea Only 05/17/2010  . Influenza vaccines Hives 05/17/2010    Family History  Problem Relation Age of Onset  . Colon cancer Father   . Breast cancer Sister   . Prostate cancer Brother   . Stomach cancer Neg Hx   . Rectal cancer Neg Hx   . Pancreatic cancer Neg Hx  Social History   Socioeconomic History  . Marital status: Divorced    Spouse name: Not on file  . Number of children: Not on file  . Years of education: Not on file  . Highest education level: Not on file  Social Needs  . Financial resource strain: Not on file  . Food insecurity - worry: Not on file  . Food insecurity - inability: Not on file  . Transportation needs - medical: Not on file  . Transportation needs - non-medical: Not on file  Occupational History  . Occupation: Dance movement psychotherapist. Horticulturist, commercial: Production assistant, radio FOR SELF EMPLOYED  Tobacco Use  . Smoking status: Never Smoker  . Smokeless tobacco: Never Used  Substance and Sexual Activity  . Alcohol  use: Yes    Alcohol/week: 0.6 oz    Types: 1 Standard drinks or equivalent per week    Comment: occ  . Drug use: No  . Sexual activity: Not Currently    Birth control/protection: None  Other Topics Concern  . Not on file  Social History Narrative  . Not on file     Physical Exam: Ht 5' 6.5" (1.689 m)   Wt 176 lb 12.8 oz (80.2 kg)   BMI 28.11 kg/m  Constitutional: generally well-appearing Psychiatric: alert and oriented x3 Eyes: extraocular movements intact Mouth: oral pharynx moist, no lesions Neck: supple no lymphadenopathy Cardiovascular: heart regular rate and rhythm Lungs: clear to auscultation bilaterally Abdomen: soft, nontender, nondistended, no obvious ascites, no peritoneal signs, normal bowel sounds Extremities: no lower extremity edema bilaterally Skin: no lesions on visible extremities   Assessment and plan: 62 y.o. female with iron deficiency anemia  She has had no overt GI bleeding.  She has had 2 colonoscopies, the most recent of which was less than 3 years ago by Dr. Bosie Clos at [facility].  I do not think that needs to be repeated just now.  She has a history of daily NSAID use, previous chronic steroid use, possibly some upper GI pathology.  She has lost 25 pounds in the past year.  I recommended to start her workup with all endoscopy at her soonest convenience.  I also recommended blood testing today to check her hemoglobin level and check serologic markers for celiac sprue.   Please see the "Patient Instructions" section for addition details about the plan.   Rob Bunting, MD [facility] 03/31/2017, 2:28 PM  Cc: Thomasene Lot, DO

## 2017-03-31 NOTE — Patient Instructions (Addendum)
You will be set up for an upper endoscopy. You will have labs checked today in the basement lab.  Please head down after you check out with the front desk  (cbc, tTG and total IgA). Cut back to once daily iron starting today.  Normal BMI (Body Mass Index- based on height and weight) is between 19 and 25. Your BMI today is Body mass index is 28.11 kg/m. Marland Kitchen Please consider follow up  regarding your BMI with your Primary Care Provider.

## 2017-04-01 LAB — TISSUE TRANSGLUTAMINASE, IGA: (tTG) Ab, IgA: 1 U/mL

## 2017-04-01 LAB — IGA: IgA: 175 mg/dL (ref 68–378)

## 2017-04-02 ENCOUNTER — Ambulatory Visit (AMBULATORY_SURGERY_CENTER): Payer: BLUE CROSS/BLUE SHIELD | Admitting: Gastroenterology

## 2017-04-02 ENCOUNTER — Encounter: Payer: Self-pay | Admitting: Gastroenterology

## 2017-04-02 VITALS — BP 129/72 | HR 54 | Temp 97.5°F | Resp 12 | Ht 66.5 in | Wt 176.0 lb

## 2017-04-02 DIAGNOSIS — K2990 Gastroduodenitis, unspecified, without bleeding: Secondary | ICD-10-CM

## 2017-04-02 DIAGNOSIS — K2970 Gastritis, unspecified, without bleeding: Secondary | ICD-10-CM | POA: Diagnosis not present

## 2017-04-02 DIAGNOSIS — D509 Iron deficiency anemia, unspecified: Secondary | ICD-10-CM | POA: Diagnosis not present

## 2017-04-02 DIAGNOSIS — D508 Other iron deficiency anemias: Secondary | ICD-10-CM

## 2017-04-02 DIAGNOSIS — K2950 Unspecified chronic gastritis without bleeding: Secondary | ICD-10-CM | POA: Diagnosis not present

## 2017-04-02 DIAGNOSIS — K449 Diaphragmatic hernia without obstruction or gangrene: Secondary | ICD-10-CM

## 2017-04-02 DIAGNOSIS — B9681 Helicobacter pylori [H. pylori] as the cause of diseases classified elsewhere: Secondary | ICD-10-CM | POA: Diagnosis not present

## 2017-04-02 MED ORDER — OMEPRAZOLE 20 MG PO CPDR: 20.0000 mg | DELAYED_RELEASE_CAPSULE | Freq: Every day | ORAL | 3 refills | Status: DC

## 2017-04-02 MED ORDER — SODIUM CHLORIDE 0.9 % IV SOLN
500.0000 mL | Freq: Once | INTRAVENOUS | Status: DC
Start: 2017-04-02 — End: 2019-11-21

## 2017-04-02 NOTE — Progress Notes (Signed)
Called to room to assist during endoscopic procedure.  Patient ID and intended procedure confirmed with present staff. Received instructions for my participation in the procedure from the performing physician.  

## 2017-04-02 NOTE — Op Note (Signed)
[facility] Patient Name: Stacy Boyd Procedure Date: 04/02/2017 7:50 AM MRN: [id number] Endoscopist: Rachael Fee , MD Age: 62 Referring MD:  Date of Birth: 06-07-1955 Gender: Female Account #: [id number] Procedure:                Upper GI endoscopy Indications:              Iron deficiency anemia Medicines:                Monitored Anesthesia Care Procedure:                Pre-Anesthesia Assessment:                           - Prior to the procedure, a History and Physical                            was performed, and patient medications and                            allergies were reviewed. The patient's tolerance of                            previous anesthesia was also reviewed. The risks                            and benefits of the procedure and the sedation                            options and risks were discussed with the patient.                            All questions were answered, and informed consent                            was obtained. Prior Anticoagulants: The patient has                            taken no previous anticoagulant or antiplatelet                            agents. ASA Grade Assessment: II - A patient with                            mild systemic disease. After reviewing the risks                            and benefits, the patient was deemed in                            satisfactory condition to undergo the procedure.                           After obtaining informed consent, the endoscope was  passed under direct vision. Throughout the                            procedure, the patient's blood pressure, pulse, and                            oxygen saturations were monitored continuously. The                            Endoscope was introduced through the mouth, and                            advanced to the second part of duodenum. The upper                            GI endoscopy was accomplished  without difficulty.                            The patient tolerated the procedure well. Scope In: Scope Out: Findings:                 The esophagus was normal.                           Medium sized hiatal hernia containing clear                            Cameron's type erosions.                           Mild inflammation was found in the gastric antrum.                            Biopsies were taken with a cold forceps for                            histology.                           The examined duodenum was normal. Complications:            No immediate complications. Estimated blood loss:                            None. Estimated Blood Loss:     Estimated blood loss: none. Impression:               - Gastritis, biopsied to check for H. pylori.                           - Medium sized hiatal hernia with Cameron's                            erosions, this is very likely the cause of your  iron deficiency. Recommendation:           - Patient has a contact number available for                            emergencies. The signs and symptoms of potential                            delayed complications were discussed with the                            patient. Return to normal activities tomorrow.                            Written discharge instructions were provided to the                            patient.                           - Resume previous diet.                           - Continue present medications. Iron once daily.                           - Please start once daily OTC omeprazole (20mg                             pill), this can help heal stomach erosions,                            inflammation.                           - My office will arrange repeat CBC in 3 months and                            follow up appt shortly afterwards. , MD 04/02/2017 8:08:33 AM This report has been signed electronically.

## 2017-04-02 NOTE — Progress Notes (Signed)
Pt's states no medical or surgical changes since previsit or office visit. 

## 2017-04-02 NOTE — Patient Instructions (Signed)
YOU HAD AN ENDOSCOPIC PROCEDURE TODAY AT THE [facility]:   Refer to the procedure report that was given to you for any specific questions about what was found during the examination.  If the procedure report does not answer your questions, please call your gastroenterologist to clarify.  If you requested that your care partner not be given the details of your procedure findings, then the procedure report has been included in a sealed envelope for you to review at your convenience later.  YOU SHOULD EXPECT: Some feelings of bloating in the abdomen. Passage of more gas than usual.  Walking can help get rid of the air that was put into your GI tract during the procedure and reduce the bloating.   Please Note:  You might notice some irritation and congestion in your nose or some drainage.  This is from the oxygen used during your procedure.  There is no need for concern and it should clear up in a day or so.  SYMPTOMS TO REPORT IMMEDIATELY:   Following upper endoscopy (EGD)  Vomiting of blood or coffee ground material  New chest pain or pain under the shoulder blades  Painful or persistently difficult swallowing  New shortness of breath  Fever of 100F or higher  Black, tarry-looking stools  For urgent or emergent issues, a gastroenterologist can be reached at any hour by calling [phone].   DIET:  We do recommend a small meal at first, but then you may proceed to your regular diet.  Drink plenty of fluids but you should avoid alcoholic beverages for 24 hours.  ACTIVITY:  You should plan to take it easy for the rest of today and you should NOT DRIVE or use heavy machinery until tomorrow (because of the sedation medicines used during the test).    FOLLOW UP: Our staff will call the number listed on your records the next business day following your procedure to check on you and address any questions or concerns that you may have regarding the information given to you following  your procedure. If we do not reach you, we will leave a message.  However, if you are feeling well and you are not experiencing any problems, there is no need to return our call.  We will assume that you have returned to your regular daily activities without incident.  If any biopsies were taken you will be contacted by phone or by letter within the next 1-3 weeks.  Please call us at [phone] if you have not heard about the biopsies in 3 weeks.    SIGNATURES/CONFIDENTIALITY: You and/or your care partner have signed paperwork which will be entered into your electronic medical record.  These signatures attest to the fact that that the information above on your After Visit Summary has been reviewed and is understood.  Full responsibility of the confidentiality of this discharge information lies with you and/or your care-partner.  Read all handouts given to you by your recovery room nurse.   Take all of your medication as ordered.

## 2017-04-02 NOTE — Progress Notes (Signed)
Report to PACU, RN, vss, BBS= Clear.  

## 2017-04-05 ENCOUNTER — Telehealth: Payer: Self-pay

## 2017-04-05 NOTE — Telephone Encounter (Signed)
  Follow up Call-  Call back number 04/02/2017  Post procedure Call Back phone  # [phone]  Permission to leave phone message Yes  Some recent data might be hidden     Patient questions:  Do you have a fever, pain , or abdominal swelling? No. Pain Score  0 *  Have you tolerated food without any problems? Yes.    Have you been able to return to your normal activities? Yes.    Do you have any questions about your discharge instructions: Diet   No. Medications  No. Follow up visit  No.  Do you have questions or concerns about your Care? No.  Actions: * If pain score is 4 or above: No action needed, pain <4.

## 2017-04-07 ENCOUNTER — Other Ambulatory Visit: Payer: Self-pay

## 2017-04-07 MED ORDER — METRONIDAZOLE 500 MG PO TABS: 500.0000 mg | ORAL_TABLET | Freq: Four times a day (QID) | ORAL | 0 refills | Status: DC

## 2017-04-07 MED ORDER — OMEPRAZOLE 20 MG PO CPDR: 20.0000 mg | DELAYED_RELEASE_CAPSULE | Freq: Two times a day (BID) | ORAL | 0 refills | Status: DC

## 2017-04-07 MED ORDER — BIS SUBCIT-METRONID-TETRACYC 140-125-125 MG PO CAPS: 3.0000 | ORAL_CAPSULE | Freq: Three times a day (TID) | ORAL | 0 refills | Status: DC

## 2017-04-07 MED ORDER — BISMUTH SUBSALICYLATE 262 MG PO TABS: 262.0000 mg | ORAL_TABLET | Freq: Four times a day (QID) | ORAL | 0 refills | Status: DC

## 2017-04-07 MED ORDER — TETRACYCLINE HCL 500 MG PO CAPS: 500.0000 mg | ORAL_CAPSULE | Freq: Four times a day (QID) | ORAL | 0 refills | Status: DC

## 2017-04-08 ENCOUNTER — Other Ambulatory Visit: Payer: Self-pay

## 2017-04-08 DIAGNOSIS — D508 Other iron deficiency anemias: Secondary | ICD-10-CM

## 2017-04-09 DIAGNOSIS — M170 Bilateral primary osteoarthritis of knee: Secondary | ICD-10-CM | POA: Diagnosis not present

## 2017-04-15 DIAGNOSIS — M1712 Unilateral primary osteoarthritis, left knee: Secondary | ICD-10-CM | POA: Diagnosis not present

## 2017-04-15 DIAGNOSIS — M1711 Unilateral primary osteoarthritis, right knee: Secondary | ICD-10-CM | POA: Diagnosis not present

## 2017-04-16 ENCOUNTER — Ambulatory Visit: Payer: BLUE CROSS/BLUE SHIELD | Admitting: Family Medicine

## 2017-04-22 ENCOUNTER — Encounter: Payer: Self-pay | Admitting: Family Medicine

## 2017-04-22 ENCOUNTER — Encounter: Payer: Self-pay | Admitting: Gastroenterology

## 2017-04-22 ENCOUNTER — Ambulatory Visit (INDEPENDENT_AMBULATORY_CARE_PROVIDER_SITE_OTHER): Payer: BLUE CROSS/BLUE SHIELD | Admitting: Family Medicine

## 2017-04-22 VITALS — BP 142/81 | HR 58 | Ht 67.0 in | Wt 175.8 lb

## 2017-04-22 DIAGNOSIS — D508 Other iron deficiency anemias: Secondary | ICD-10-CM

## 2017-04-22 DIAGNOSIS — D509 Iron deficiency anemia, unspecified: Secondary | ICD-10-CM

## 2017-04-22 DIAGNOSIS — B9681 Helicobacter pylori [H. pylori] as the cause of diseases classified elsewhere: Secondary | ICD-10-CM | POA: Insufficient documentation

## 2017-04-22 DIAGNOSIS — K2970 Gastritis, unspecified, without bleeding: Secondary | ICD-10-CM | POA: Diagnosis not present

## 2017-04-22 DIAGNOSIS — Z791 Long term (current) use of non-steroidal anti-inflammatories (NSAID): Secondary | ICD-10-CM

## 2017-04-22 DIAGNOSIS — M1710 Unilateral primary osteoarthritis, unspecified knee: Secondary | ICD-10-CM | POA: Diagnosis not present

## 2017-04-22 NOTE — Progress Notes (Signed)
Impression and Recommendations:    1. Acquired iron deficiency anemia due to decreased absorption- due to rheum meds and tums etc   2. NSAID long-term use   3. Tricompartment osteoarthritis of knee, unspecified laterality   4. Helicobacter pylori gastritis     1. Gastroenterology - H. pylori infection (treatment in progress) - Patient recently had an endoscopy, discovered H. Pylori and is currently treating with antibiotics, finishing tomorrow after 2 weeks of use.  Continue following up with Dr. Rob Bunting with resolution of H. pylori infection.  Has a follow up visit at the end of June, with lab work to be drawn.  - Dr. Rob Bunting doubled her omeprazole during this time and gave her pepto bismol tabs to take during treatment.  - Continue to avoid use of NSAID's.  - Recommended probiotics and eating yogurt every day.  - Continue avoiding ingestion after 7 PM.  2. Acquired Iron Deficiency Anemia - Last CBC stable.  - 3 weeks ago, patient was cut down on iron tablets from 3 per day, down to 1 per day per Dr. Christella Hartigan. - Continue taking one iron tablet per day and evaluate anemia per lab work in June (with Laurette Schimke).  - Patient refuses blood work today and says she will obtain with Dr. Christella Hartigan in June.  3. Knee Pain - Patient follows up with Dr. Lequita Halt for knee pain and related injections.  - In addition to treatment through Dr. Deri Fuelling office, advised that strengthening the muscles around the knee may help compensate for pain caused by weak ligaments and joint cartilage.  Advised patient to look into strength conditioning.  - Advised patient to be mindful of the status of her knees while gardening, to stay off of her knees and avoid kneeling.  - Continue icing knees every evening, 15-20 minutes of icing, two to three times per day.  - Advised the use of compression around her knees, and that more compression is better, including ace bandages.  4. General Health  Maintenance - Advised patient to continue working toward exercising to improve health.    - Recommended that the patient eventually strive for at least 150 minutes of cardio per week according to the [facility].   - Healthy dietary habits encouraged, including low-carb, and high amounts of lean protein in diet.   - Patient should also consume adequate amounts of water - half of body weight in oz of water per day.  5. Follow-Up - Patient notes that she is due for a tetanus vaccine.    - Continue following up with specialists.  - Return for scheduled chronic follow-up and PRN.  Gross side effects, risk and benefits, and alternatives of medications and treatment plan in general discussed with patient.  Patient is aware that all medications have potential side effects and we are unable to predict every side effect or drug-drug interaction that may occur.   Patient will call with any questions prior to using medication if they have concerns.  Expresses verbal understanding and consents to current therapy and treatment regimen.  No barriers to understanding were identified.  Red flag symptoms and signs discussed in detail.  Patient expressed understanding regarding what to do in case of emergency\urgent symptoms  Please see AVS handed out to patient at the end of our visit for further patient instructions/ counseling done pertaining to today's office visit.   Return for f/up around Nov for FBW and OV.    Note: This note was prepared  with assistance of Conservation officer, historic buildings. Occasional wrong-word or sound-a-like substitutions may have occurred due to the inherent limitations of voice recognition software.   This document serves as a record of services personally performed by Thomasene Lot, DO. It was created on her behalf by Peggye Fothergill, a trained medical scribe. The creation of this record is based on the scribe's personal observations and the provider's statements to them.   I  have reviewed the above medical documentation for accuracy and completeness and I concur.  Thomasene Lot 04/27/17 9:47 PM    --------------------------------------------------------------------------------------------------------------------------------------------------------------------    Subjective:     HPI: Stacy Boyd is a 62 y.o. female who presents to [facility] today for issues as discussed below.   Notes that she started getting sick January a year ago.  Patient notes today that she is due for a tetanus vaccine.    Still an avid gardener.  Notes "it's getting up that's the problem," regarding her knees.  Patient has stopped taking Ambien.  She uses sublingual melatonin, and doesn't eat after 7 PM.    Recent H. pylori Infection - Treatment in Progress Patient recently visited gastroenterology, had an endoscopy, discovered H. pylori and is currently treating with antibiotics.  Will be finishing up her antibiotics tomorrow after 2 weeks of use.  Notes that using the antibiotic cocktail "wasn't as bad as I was expecting," but she has been taking antibiotic pills all day long.  Patient was advised to stop using NSAID's as her infection resolves.  Has a follow up visit with him at the end of June.  Dr. Rob Bunting doubled her omeprazole during this time and gave her pepto bismol tabs to take during treatment.  Notes that gastroenterology also discovered a hiatal hernia.  Acquired Iron Deficiency Anemia Patient notes that she feels great.  She feels squared away on her anemia and comments "I don't feel bad at all."  Per Dr. Rob Bunting, her supplemental iron has been cut down from 3 to 1 tablet per day.  Switched from 3 tabs to 1 at her endoscopy three weeks ago.  She is scheduled for blood work to assess her anemia at the end of June, with Dr. Christella Hartigan.  Denies anemia symptoms; notes that she has a little bit of fatigue, but wonders  if this may be related to the antibiotic.  Knee Pain Visiting Dr. Deri Fuelling office for follow-up and injections in her knees.  Notes that her knees are "nasty" and she will need a knee replacement eventually in the future.  Has not been referred to physical therapy yet.  Patient is icing her knees every evening per prior recommendations.   Wt Readings from Last 3 Encounters:  04/22/17 175 lb 12.8 oz (79.7 kg)  04/02/17 176 lb (79.8 kg)  03/31/17 176 lb 12.8 oz (80.2 kg)   BP Readings from Last 3 Encounters:  04/22/17 (!) 142/81  04/02/17 129/72  02/16/17 (!) 148/81   Pulse Readings from Last 3 Encounters:  04/22/17 (!) 58  04/02/17 (!) 54  02/16/17 100   BMI Readings from Last 3 Encounters:  04/22/17 27.53 kg/m  04/02/17 27.98 kg/m  03/31/17 28.11 kg/m     Patient Care Team    Relationship Specialty Notifications Start End  Thomasene Lot, DO PCP - General Family Medicine  11/24/16   Rossie Muskrat, MD  Rheumatology  11/24/16   Marcelle Overlie, MD Consulting Physician Obstetrics and Gynecology  11/24/16   Christella Hartigan,  Melton Alar, MD Attending Physician Gastroenterology  04/22/17   Charlott Rakes, MD Consulting Physician Gastroenterology  04/22/17      Patient Active Problem List   Diagnosis Date Noted  . Acquired hypothyroidism 05/07/2010    Priority: High  . Helicobacter pylori gastritis 04/22/2017  . Synovial cyst of left popliteal space 03/12/2017  . NSAID long-term use 02/16/2017  . Tricompartment osteoarthritis of knee 02/16/2017  . Acquired iron deficiency anemia due to decreased absorption- due to rheum meds and tums etc 02/16/2017  . Rheumatoid arthritis (HCC) 11/24/2016  . Low testosterone level in female 11/24/2016  . Family history of colon cancer 08/31/2014  . Bilateral leg edema 08/29/2013  . Osteoarthritis, knee 08/29/2012  . Osteopenia 05/07/2010  . Rosacea 05/07/2010  . Sjogren's disease (HCC) 05/07/2010  . Menopausal hot flushes  02/11/2009    Past Medical history, Surgical history, Family history, Social history, Allergies and Medications have been entered into the medical record, reviewed and changed as needed.    Current Meds  Medication Sig  . Bismuth Subsalicylate 262 MG TABS Take 1 tablet (262 mg total) by mouth 4 (four) times daily.  . ferrous sulfate 325 (65 FE) MG EC tablet Take 325 mg by mouth 3 (three) times daily with meals.  . Melatonin 2.5 MG CAPS Take 1 capsule at bedtime as needed by mouth.  . metroNIDAZOLE (FLAGYL) 500 MG tablet Take 1 tablet (500 mg total) by mouth 4 (four) times daily.  . Multiple Vitamins-Minerals (CENTRUM SILVER 50+WOMEN PO) Take 1 tablet daily by mouth.  . Omega-3 Fatty Acids (FISH OIL) 1200 MG CAPS Take 1 capsule daily by mouth.  Marland Kitchen omeprazole (PRILOSEC) 20 MG capsule Take 1 capsule (20 mg total) by mouth 2 (two) times daily before a meal.  . Polyethyl Glycol-Propyl Glycol (SYSTANE) 0.4-0.3 % SOLN Apply daily to eye.  Marland Kitchen SPIRULINA PO Take 1 tablet by mouth daily.  Marland Kitchen testosterone cypionate (DEPOTESTOTERONE CYPIONATE) 100 MG/ML injection Inject 100 mg into the muscle. Every 10-12 weeks  . tetracycline (ACHROMYCIN,SUMYCIN) 500 MG capsule Take 1 capsule (500 mg total) by mouth 4 (four) times daily.  . TURMERIC PO Take 1 tablet daily by mouth.  Marland Kitchen UNABLE TO FIND Take 1 capsule daily by mouth. Med Name: liothryonine sodium (t#)/thyroxine-L (T4) SR 21-89 mcg  1 capsule orally daily  . zolpidem (AMBIEN) 5 MG tablet Take 5 mg at bedtime as needed by mouth for sleep.  . [DISCONTINUED] omeprazole (PRILOSEC) 20 MG capsule Take 1 capsule (20 mg total) by mouth daily.   Current Facility-Administered Medications for the 04/22/17 encounter (Office Visit) with Thomasene Lot, DO  Medication  . 0.9 %  sodium chloride infusion    Allergies:  Allergies  Allergen Reactions  . Erythromycin Hives and Nausea Only  . Influenza Vaccines Hives     Review of Systems:  A fourteen system review  of systems was performed and found to be positive as per HPI.   Objective:   Blood pressure (!) 142/81, pulse (!) 58, height 5\' 7"  (1.702 m), weight 175 lb 12.8 oz (79.7 kg), SpO2 98 %. Body mass index is 27.53 kg/m. General:  Well Developed, well nourished, appropriate for stated age.  Neuro:  Alert and oriented,  extra-ocular muscles intact  HEENT:  Normocephalic, atraumatic, neck supple, no carotid bruits appreciated  Skin:  no gross rash, warm, pink. Cardiac:  RRR, S1 S2 Respiratory:  ECTA B/L and A/P, Not using accessory muscles, speaking in full sentences- unlabored. Vascular:  Ext warm,  no cyanosis apprec.; cap RF less 2 sec. Psych:  No HI/SI, judgement and insight good, Euthymic mood. Full Affect.

## 2017-04-23 DIAGNOSIS — M1711 Unilateral primary osteoarthritis, right knee: Secondary | ICD-10-CM | POA: Diagnosis not present

## 2017-04-23 DIAGNOSIS — M1712 Unilateral primary osteoarthritis, left knee: Secondary | ICD-10-CM | POA: Diagnosis not present

## 2017-05-24 ENCOUNTER — Ambulatory Visit: Payer: BLUE CROSS/BLUE SHIELD | Admitting: Family Medicine

## 2017-05-26 ENCOUNTER — Ambulatory Visit: Payer: BLUE CROSS/BLUE SHIELD | Admitting: Family Medicine

## 2017-05-26 DIAGNOSIS — M7122 Synovial cyst of popliteal space [Baker], left knee: Secondary | ICD-10-CM | POA: Diagnosis not present

## 2017-06-04 DIAGNOSIS — M170 Bilateral primary osteoarthritis of knee: Secondary | ICD-10-CM | POA: Diagnosis not present

## 2017-06-07 ENCOUNTER — Encounter: Payer: Self-pay | Admitting: Gastroenterology

## 2017-06-08 NOTE — Telephone Encounter (Signed)
Dr Christella Hartigan can you review, the pt is not sure if omeprazole is causing the rash on her face.  She did attach a picture.

## 2017-06-10 DIAGNOSIS — R5383 Other fatigue: Secondary | ICD-10-CM | POA: Diagnosis not present

## 2017-07-01 ENCOUNTER — Other Ambulatory Visit (INDEPENDENT_AMBULATORY_CARE_PROVIDER_SITE_OTHER): Payer: BLUE CROSS/BLUE SHIELD

## 2017-07-01 DIAGNOSIS — D508 Other iron deficiency anemias: Secondary | ICD-10-CM | POA: Diagnosis not present

## 2017-07-01 LAB — CBC WITH DIFFERENTIAL/PLATELET
Basophils Absolute: 0.1 10*3/uL (ref 0.0–0.1)
Basophils Relative: 0.9 % (ref 0.0–3.0)
Eosinophils Absolute: 0.3 10*3/uL (ref 0.0–0.7)
Eosinophils Relative: 3.4 % (ref 0.0–5.0)
HCT: 45 % (ref 36.0–46.0)
Hemoglobin: 15.2 g/dL — ABNORMAL HIGH (ref 12.0–15.0)
Lymphocytes Relative: 28 % (ref 12.0–46.0)
Lymphs Abs: 2.2 10*3/uL (ref 0.7–4.0)
MCHC: 33.8 g/dL (ref 30.0–36.0)
MCV: 96.9 fl (ref 78.0–100.0)
Monocytes Absolute: 0.9 10*3/uL (ref 0.1–1.0)
Monocytes Relative: 11.4 % (ref 3.0–12.0)
Neutro Abs: 4.4 10*3/uL (ref 1.4–7.7)
Neutrophils Relative %: 56.3 % (ref 43.0–77.0)
Platelets: 305 10*3/uL (ref 150.0–400.0)
RBC: 4.64 Mil/uL (ref 3.87–5.11)
RDW: 13.7 % (ref 11.5–15.5)
WBC: 7.8 10*3/uL (ref 4.0–10.5)

## 2017-07-05 ENCOUNTER — Other Ambulatory Visit: Payer: BLUE CROSS/BLUE SHIELD

## 2017-07-05 ENCOUNTER — Encounter: Payer: Self-pay | Admitting: Gastroenterology

## 2017-07-05 ENCOUNTER — Other Ambulatory Visit: Payer: Self-pay | Admitting: Gastroenterology

## 2017-07-05 ENCOUNTER — Ambulatory Visit: Payer: BLUE CROSS/BLUE SHIELD | Admitting: Gastroenterology

## 2017-07-05 VITALS — BP 118/70 | HR 72 | Ht 67.0 in | Wt 179.0 lb

## 2017-07-05 DIAGNOSIS — D508 Other iron deficiency anemias: Secondary | ICD-10-CM | POA: Diagnosis not present

## 2017-07-05 MED ORDER — RANITIDINE HCL 150 MG PO TABS: 150.0000 mg | ORAL_TABLET | Freq: Two times a day (BID) | ORAL | 3 refills | Status: DC

## 2017-07-05 NOTE — Progress Notes (Signed)
Review of pertinent gastrointestinal problems: 1.  Iron deficiency anemia 2019.  Hemoglobin in sevens, MCV 60s, low ferritin.  EGD 03/2017 Dr. Christella Hartigan found moderate to large hiatal hernia with Cameron's erosions and H. pylori positive gastritis.  Treated with appropriate antibiotics.  Iron deficiency felt possibly from Cameron's erosions that may have been losing more due to underlying H. pylori. 2. FH of colon cancer  Colonoscopy; September 2016, Dr. Bosie Clos at [facility].  Indications "colorectal cancer in father, last colonoscopy June 2010".  Findings internal hemorrhoids.  The examination was otherwise normal.  The terminal ileum was normal.  He recommended that she have repeat colonoscopy at 5 years (would be 09/2019) Colonoscopy June 2010, Dr. Bosie Clos.  Indications "colon cancer in father, 4 year old or older".  Examination was completely normal and he recommended repeat colonoscopy at 5-year interval    HPI: This is a very pleasant 61 year old woman whom I last saw the time of an upper endoscopy about 3 months ago.  I found H. pylori positive gastritis with a medium to large hiatal hernia and associated Cameron's erosions.  I suspected that these Cameron's erosions and her H. pylori gastritis were the cause of her iron deficiency anemia.  Blood work last week showed her hemoglobin was 15.  She has clearly responded to iron.  She feels well.  She has had no significant abdominal pains.  Her stools are a bit darker on the iron but otherwise normal.  Chief complaint is H. pylori, iron deficiency anemia   ROS: complete GI ROS as described in HPI, all other review negative.  Constitutional:  No unintentional weight loss   Past Medical History:  Diagnosis Date  . Hypothyroid     Past Surgical History:  Procedure Laterality Date  . ABDOMINAL HYSTERECTOMY    . CESAREAN SECTION    . COLONOSCOPY      Current Outpatient Medications  Medication Sig Dispense Refill  . Bismuth Subsalicylate  262 MG TABS Take 1 tablet (262 mg total) by mouth 4 (four) times daily. 112 each 0  . ferrous sulfate 325 (65 FE) MG EC tablet Take 325 mg by mouth 3 (three) times daily with meals.    . Melatonin 2.5 MG CAPS Take 1 capsule at bedtime as needed by mouth.    . Multiple Vitamins-Minerals (CENTRUM SILVER 50+WOMEN PO) Take 1 tablet daily by mouth.    . Omega-3 Fatty Acids (FISH OIL) 1200 MG CAPS Take 1 capsule daily by mouth.    Bertram Gala Glycol-Propyl Glycol (SYSTANE) 0.4-0.3 % SOLN Apply daily to eye.    Marland Kitchen SPIRULINA PO Take 1 tablet by mouth daily.    Marland Kitchen testosterone cypionate (DEPOTESTOTERONE CYPIONATE) 100 MG/ML injection Inject 100 mg into the muscle. Every 10-12 weeks    . TURMERIC PO Take 1 tablet daily by mouth.    Marland Kitchen UNABLE TO FIND Take 1 capsule daily by mouth. Med Name: liothryonine sodium (t#)/thyroxine-L (T4) SR 21-89 mcg  1 capsule orally daily     Current Facility-Administered Medications  Medication Dose Route Frequency Provider Last Rate Last Dose  . 0.9 %  sodium chloride infusion  500 mL Intravenous Once Rachael Fee, MD        Allergies as of 07/05/2017 - Review Complete 07/05/2017  Allergen Reaction Noted  . Erythromycin Hives and Nausea Only 05/17/2010  . Influenza vaccines Hives 05/17/2010    Family History  Problem Relation Age of Onset  . Colon cancer Father   . Breast cancer Sister   .  Prostate cancer Brother   . Stomach cancer Neg Hx   . Rectal cancer Neg Hx   . Pancreatic cancer Neg Hx     Social History   Socioeconomic History  . Marital status: Divorced    Spouse name: Not on file  . Number of children: Not on file  . Years of education: Not on file  . Highest education level: Not on file  Occupational History  . Occupation: Dance movement psychotherapist. Horticulturist, commercial: Production assistant, radio FOR SELF EMPLOYED  Social Needs  . Financial resource strain: Not on file  . Food insecurity:    Worry: Not on file    Inability: Not on file  . Transportation needs:     Medical: Not on file    Non-medical: Not on file  Tobacco Use  . Smoking status: Never Smoker  . Smokeless tobacco: Never Used  Substance and Sexual Activity  . Alcohol use: Yes    Alcohol/week: 0.6 oz    Types: 1 Standard drinks or equivalent per week    Comment: occ  . Drug use: No  . Sexual activity: Not Currently    Birth control/protection: None  Lifestyle  . Physical activity:    Days per week: 5 days    Minutes per session: Not on file  . Stress: Not on file  Relationships  . Social connections:    Talks on phone: Not on file    Gets together: Not on file    Attends religious service: Not on file    Active member of club or organization: Not on file    Attends meetings of clubs or organizations: Not on file    Relationship status: Not on file  . Intimate partner violence:    Fear of current or ex partner: Not on file    Emotionally abused: Not on file    Physically abused: Not on file    Forced sexual activity: Not on file  Other Topics Concern  . Not on file  Social History Narrative  . Not on file     Physical Exam: BP 118/70   Pulse 72   Ht 5\' 7"  (1.702 m)   Wt 179 lb (81.2 kg)   BMI 28.04 kg/m  Constitutional: generally well-appearing Psychiatric: alert and oriented x3 Abdomen: soft, nontender, nondistended, no obvious ascites, no peritoneal signs, normal bowel sounds No peripheral edema noted in lower extremities  Assessment and plan: 62 y.o. female with resolved iron deficiency anemia, H. pylori positive gastritis, Cameron's erosions  She is currently on H2 blocker daily and she feels very well.  I would like her to stay on that to see if that can give some acid protection to help keep her Cameron's erosions treated.  Acid suppression generally seems to help Cameron's erosions.  Going to test her to see if her H. pylori was effectively eradicated with stool antigen testing.  I told her it is okay to stop iron today I will follow her blood counts  with a repeat CBC in 3 months.  If her blood counts drop again off of iron supplements then she may need further testing.  Please see the "Patient Instructions" section for addition details about the plan.  75, MD [facility] 07/05/2017, 4:00 PM

## 2017-07-05 NOTE — Patient Instructions (Addendum)
H. Pylori stool testing today. Stay on zantac once dialy. CBC in 3 months. Stop iron today.  Normal BMI (Body Mass Index- based on height and weight) is between 19 and 25. Your BMI today is Body mass index is 28.04 kg/m. Marland Kitchen Please consider follow up  regarding your BMI with your Primary Care Provider.

## 2017-07-06 ENCOUNTER — Other Ambulatory Visit: Payer: BLUE CROSS/BLUE SHIELD

## 2017-07-06 DIAGNOSIS — D508 Other iron deficiency anemias: Secondary | ICD-10-CM

## 2017-07-07 LAB — HELICOBACTER PYLORI  SPECIAL ANTIGEN
MICRO NUMBER:: 90757642
SPECIMEN QUALITY: ADEQUATE

## 2017-07-26 DIAGNOSIS — M810 Age-related osteoporosis without current pathological fracture: Secondary | ICD-10-CM | POA: Diagnosis not present

## 2017-07-26 DIAGNOSIS — M170 Bilateral primary osteoarthritis of knee: Secondary | ICD-10-CM | POA: Diagnosis not present

## 2017-07-26 DIAGNOSIS — M25569 Pain in unspecified knee: Secondary | ICD-10-CM | POA: Diagnosis not present

## 2017-07-26 DIAGNOSIS — M3500 Sicca syndrome, unspecified: Secondary | ICD-10-CM | POA: Diagnosis not present

## 2017-07-26 DIAGNOSIS — R768 Other specified abnormal immunological findings in serum: Secondary | ICD-10-CM | POA: Diagnosis not present

## 2017-08-06 ENCOUNTER — Encounter: Payer: Self-pay | Admitting: Gastroenterology

## 2017-09-03 DIAGNOSIS — R6882 Decreased libido: Secondary | ICD-10-CM | POA: Diagnosis not present

## 2017-10-04 ENCOUNTER — Other Ambulatory Visit (INDEPENDENT_AMBULATORY_CARE_PROVIDER_SITE_OTHER): Payer: BLUE CROSS/BLUE SHIELD

## 2017-10-04 DIAGNOSIS — D508 Other iron deficiency anemias: Secondary | ICD-10-CM | POA: Diagnosis not present

## 2017-10-04 LAB — CBC WITH DIFFERENTIAL/PLATELET
Basophils Absolute: 0.1 10*3/uL (ref 0.0–0.1)
Basophils Relative: 0.8 % (ref 0.0–3.0)
Eosinophils Absolute: 0.3 10*3/uL (ref 0.0–0.7)
Eosinophils Relative: 2.2 % (ref 0.0–5.0)
HCT: 44.1 % (ref 36.0–46.0)
Hemoglobin: 15 g/dL (ref 12.0–15.0)
Lymphocytes Relative: 14.7 % (ref 12.0–46.0)
Lymphs Abs: 1.7 10*3/uL (ref 0.7–4.0)
MCHC: 34 g/dL (ref 30.0–36.0)
MCV: 93.8 fl (ref 78.0–100.0)
Monocytes Absolute: 1 10*3/uL (ref 0.1–1.0)
Monocytes Relative: 8.6 % (ref 3.0–12.0)
Neutro Abs: 8.6 10*3/uL — ABNORMAL HIGH (ref 1.4–7.7)
Neutrophils Relative %: 73.7 % (ref 43.0–77.0)
Platelets: 305 10*3/uL (ref 150.0–400.0)
RBC: 4.7 Mil/uL (ref 3.87–5.11)
RDW: 13.9 % (ref 11.5–15.5)
WBC: 11.6 10*3/uL — ABNORMAL HIGH (ref 4.0–10.5)

## 2017-10-05 ENCOUNTER — Other Ambulatory Visit: Payer: Self-pay

## 2017-10-05 DIAGNOSIS — D508 Other iron deficiency anemias: Secondary | ICD-10-CM

## 2017-11-08 ENCOUNTER — Encounter: Payer: Self-pay | Admitting: Family Medicine

## 2017-11-12 ENCOUNTER — Encounter: Payer: Self-pay | Admitting: Family Medicine

## 2017-11-12 ENCOUNTER — Ambulatory Visit (INDEPENDENT_AMBULATORY_CARE_PROVIDER_SITE_OTHER): Payer: BLUE CROSS/BLUE SHIELD | Admitting: Family Medicine

## 2017-11-12 VITALS — BP 138/74 | HR 61 | Temp 97.9°F | Ht 67.0 in | Wt 194.0 lb

## 2017-11-12 DIAGNOSIS — Z719 Counseling, unspecified: Secondary | ICD-10-CM

## 2017-11-12 DIAGNOSIS — M8580 Other specified disorders of bone density and structure, unspecified site: Secondary | ICD-10-CM

## 2017-11-12 DIAGNOSIS — L719 Rosacea, unspecified: Secondary | ICD-10-CM

## 2017-11-12 DIAGNOSIS — Z0000 Encounter for general adult medical examination without abnormal findings: Secondary | ICD-10-CM

## 2017-11-12 DIAGNOSIS — D508 Other iron deficiency anemias: Secondary | ICD-10-CM | POA: Diagnosis not present

## 2017-11-12 DIAGNOSIS — Z780 Asymptomatic menopausal state: Secondary | ICD-10-CM

## 2017-11-12 DIAGNOSIS — E039 Hypothyroidism, unspecified: Secondary | ICD-10-CM

## 2017-11-12 DIAGNOSIS — D72829 Elevated white blood cell count, unspecified: Secondary | ICD-10-CM

## 2017-11-12 DIAGNOSIS — H6123 Impacted cerumen, bilateral: Secondary | ICD-10-CM

## 2017-11-12 DIAGNOSIS — M810 Age-related osteoporosis without current pathological fracture: Secondary | ICD-10-CM | POA: Diagnosis not present

## 2017-11-12 DIAGNOSIS — Z800 Family history of malignant neoplasm of digestive organs: Secondary | ICD-10-CM

## 2017-11-12 MED ORDER — METRONIDAZOLE 0.75 % EX GEL: 1.0000 "application " | Freq: Two times a day (BID) | CUTANEOUS | 1 refills | Status: DC

## 2017-11-12 NOTE — Patient Instructions (Addendum)
Rosacea Rosacea is a long-term (chronic) condition that affects the skin of the face, including the cheeks, nose, brow, and chin. This condition can also affect the eyes. Rosacea causes blood vessels near the surface of the skin to enlarge, which results in redness. What are the causes? The cause of this condition is not known. Certain triggers can make rosacea worse, including:  Hot baths.  Exercise.  Sunlight.  Very hot or cold temperatures.  Hot or spicy foods and drinks.  Drinking alcohol.  Stress.  Taking blood pressure medicine.  Long-term use of topical steroids on the face.  What increases the risk? This condition is more likely to develop in:  People who are older than 64 years of age.  Women.  People who have light-colored skin (light complexion).  People who have a family history of rosacea.  What are the signs or symptoms? Symptoms of this condition include:  Redness of the face.  Red bumps or pimples on the face.  A red, enlarged nose.  Blushing easily.  Red lines on the skin.  Irritated or burning feeling in the eyes.  Swollen eyelids.  How is this diagnosed? This condition is diagnosed with a medical history and physical exam. How is this treated? There is no cure for this condition, but treatment can help to control your symptoms. Your health care provider may recommend that you see a skin specialist (dermatologist). Treatment may include:  Antibiotic medicines that are applied to the skin or taken as a pill.  Laser treatment to improve the appearance of the skin.  Surgery. This is rare.  Your health care provider will also recommend the best way to take care of your skin. Even after your skin improves, you will likely need to continue treatment to prevent your rosacea from coming back. Follow these instructions at home: Skin Care Take care of your skin as told by your health care provider. You may be told to do these things:  Wash  your skin gently two or more times each day.  Use mild soap.  Use a sunscreen or sunblock with SPF 30 or greater.  Use gentle cosmetics that are meant for sensitive skin.  Shave with an electric shaver instead of a blade.  Lifestyle  Try to keep track of what foods trigger this condition. Avoid any triggers. These may include: ? Spicy foods. ? Seafood. ? Cheese. ? Hot liquids. ? Nuts. ? Chocolate. ? Iodized salt.  Do not drink alcohol.  Avoid extremely cold or hot temperatures.  Try to reduce your stress. If you need help, talk with your health care provider.  When you exercise, do these things to stay cool: ? Limit your sun exposure. ? Use a fan. ? Do shorter and more frequent intervals of exercise. General instructions  Keep all follow-up visits as told by your health care provider. This is important.  Take over-the-counter and prescription medicines only as told by your health care provider.  If your eyelids are affected, apply warm compresses to them. Do this as told by your health care provider.  If you were prescribed an antibiotic medicine, apply or take it as told by your health care provider. Do not stop using the antibiotic even if your condition improves. Contact a health care provider if:  Your symptoms get worse.  Your symptoms do not improve after two months of treatment.  You have new symptoms.  You have any changes in vision or you have problems with your eyes, such as   redness or itching.  You feel depressed.  You lose your appetite.  You have trouble concentrating. This information is not intended to replace advice given to you by your health care provider. Make sure you discuss any questions you have with your health care provider. Document Released: 02/06/2004 Document Revised: 06/06/2015 Document Reviewed: 03/07/2014 Elsevier Interactive Patient Education  2018 [place] for Adults, Female  A healthy lifestyle  and preventive care can promote health and wellness. Preventive health guidelines for women include the following key practices.   A routine yearly physical is a good way to check with your health care provider about your health and preventive screening. It is a chance to share any concerns and updates on your health and to receive a thorough exam.   Visit your dentist for a routine exam and preventive care every 6 months. Brush your teeth twice a day and floss once a day. Good oral hygiene prevents tooth decay and gum disease.   The frequency of eye exams is based on your age, health, family medical history, use of contact lenses, and other factors. Follow your health care provider's recommendations for frequency of eye exams.   Eat a healthy diet. Foods like vegetables, fruits, whole grains, low-fat dairy products, and lean protein foods contain the nutrients you need without too many calories. Decrease your intake of foods high in solid fats, added sugars, and salt. Eat the right amount of calories for you.Get information about a proper diet from your health care provider, if necessary.   Regular physical exercise is one of the most important things you can do for your health. Most adults should get at least 150 minutes of moderate-intensity exercise (any activity that increases your heart rate and causes you to sweat) each week. In addition, most adults need muscle-strengthening exercises on 2 or more days a week.   Maintain a healthy weight. The body mass index (BMI) is a screening tool to identify possible weight problems. It provides an estimate of body fat based on height and weight. Your health care provider can find your BMI, and can help you achieve or maintain a healthy weight.For adults 20 years and older:   - A BMI below 18.5 is considered underweight.   - A BMI of 18.5 to 24.9 is normal.   - A BMI of 25 to 29.9 is considered overweight.   - A BMI of 30 and above is considered  obese.   Maintain normal blood lipids and cholesterol levels by exercising and minimizing your intake of trans and saturated fats.  Eat a balanced diet with plenty of fruit and vegetables. Blood tests for lipids and cholesterol should begin at age 86 and be repeated every 5 years minimum.  If your lipid or cholesterol levels are high, you are over 40, or you are at high risk for heart disease, you may need your cholesterol levels checked more frequently.Ongoing high lipid and cholesterol levels should be treated with medicines if diet and exercise are not working.   If you smoke, find out from your health care provider how to quit. If you do not use tobacco, do not start.   Lung cancer screening is recommended for adults aged 72-80 years who are at high risk for developing lung cancer because of a history of smoking. A yearly low-dose CT scan of the lungs is recommended for people who have at least a 30-pack-year history of smoking and are a current smoker or have  quit within the past 15 years. A pack year of smoking is smoking an average of 1 pack of cigarettes a day for 1 year (for example: 1 pack a day for 30 years or 2 packs a day for 15 years). Yearly screening should continue until the smoker has stopped smoking for at least 15 years. Yearly screening should be stopped for people who develop a health problem that would prevent them from having lung cancer treatment.   If you are pregnant, do not drink alcohol. If you are breastfeeding, be very cautious about drinking alcohol. If you are not pregnant and choose to drink alcohol, do not have more than 1 drink per day. One drink is considered to be 12 ounces (355 mL) of beer, 5 ounces (148 mL) of wine, or 1.5 ounces (44 mL) of liquor.   Avoid use of street drugs. Do not share needles with anyone. Ask for help if you need support or instructions about stopping the use of drugs.   High blood pressure causes heart disease and increases the risk of  stroke. Your blood pressure should be checked at least yearly.  Ongoing high blood pressure should be treated with medicines if weight loss and exercise do not work.   If you are 33-53 years old, ask your health care provider if you should take aspirin to prevent strokes.   Diabetes screening involves taking a blood sample to check your fasting blood sugar level. This should be done once every 3 years, after age 46, if you are within normal weight and without risk factors for diabetes. Testing should be considered at a younger age or be carried out more frequently if you are overweight and have at least 1 risk factor for diabetes.   Breast cancer screening is essential preventive care for women. You should practice "breast self-awareness."  This means understanding the normal appearance and feel of your breasts and may include breast self-examination.  Any changes detected, no matter how small, should be reported to a health care provider.  Women in their 23s and 30s should have a clinical breast exam (CBE) by a health care provider as part of a regular health exam every 1 to 3 years.  After age 30, women should have a CBE every year.  Starting at age 60, women should consider having a mammogram (breast X-ray test) every year.  Women who have a family history of breast cancer should talk to their health care provider about genetic screening.  Women at a high risk of breast cancer should talk to their health care providers about having an MRI and a mammogram every year.   -Breast cancer gene (BRCA)-related cancer risk assessment is recommended for women who have family members with BRCA-related cancers. BRCA-related cancers include breast, ovarian, tubal, and peritoneal cancers. Having family members with these cancers may be associated with an increased risk for harmful changes (mutations) in the breast cancer genes BRCA1 and BRCA2. Results of the assessment will determine the need for genetic counseling  and BRCA1 and BRCA2 testing.   The Pap test is a screening test for cervical cancer. A Pap test can show cell changes on the cervix that might become cervical cancer if left untreated. A Pap test is a procedure in which cells are obtained and examined from the lower end of the uterus (cervix).   - Women should have a Pap test starting at age 80.   - Between ages 31 and 16, Pap tests should be repeated every  2 years.   - Beginning at age 30, you should have a Pap test every 3 years as long as the past 3 Pap tests have been normal.   - Some women have medical problems that increase the chance of getting cervical cancer. Talk to your health care provider about these problems. It is especially important to talk to your health care provider if a new problem develops soon after your last Pap test. In these cases, your health care provider may recommend more frequent screening and Pap tests.   - The above recommendations are the same for women who have or have not gotten the vaccine for human papillomavirus (HPV).   - If you had a hysterectomy for a problem that was not cancer or a condition that could lead to cancer, then you no longer need Pap tests. Even if you no longer need a Pap test, a regular exam is a good idea to make sure no other problems are starting.   - If you are between ages 37 and 44 years, and you have had normal Pap tests going back 10 years, you no longer need Pap tests. Even if you no longer need a Pap test, a regular exam is a good idea to make sure no other problems are starting.   - If you have had past treatment for cervical cancer or a condition that could lead to cancer, you need Pap tests and screening for cancer for at least 20 years after your treatment.   - If Pap tests have been discontinued, risk factors (such as a new sexual partner) need to be reassessed to determine if screening should be resumed.   - The HPV test is an additional test that may be used for  cervical cancer screening. The HPV test looks for the virus that can cause the cell changes on the cervix. The cells collected during the Pap test can be tested for HPV. The HPV test could be used to screen women aged 29 years and older, and should be used in women of any age who have unclear Pap test results. After the age of 64, women should have HPV testing at the same frequency as a Pap test.   Colorectal cancer can be detected and often prevented. Most routine colorectal cancer screening begins at the age of 56 years and continues through age 55 years. However, your health care provider may recommend screening at an earlier age if you have risk factors for colon cancer. On a yearly basis, your health care provider may provide home test kits to check for hidden blood in the stool.  Use of a small camera at the end of a tube, to directly examine the colon (sigmoidoscopy or colonoscopy), can detect the earliest forms of colorectal cancer. Talk to your health care provider about this at age 4, when routine screening begins. Direct exam of the colon should be repeated every 5 -10 years through age 28 years, unless early forms of pre-cancerous polyps or small growths are found.   People who are at an increased risk for hepatitis B should be screened for this virus. You are considered at high risk for hepatitis B if:  -You were born in a country where hepatitis B occurs often. Talk with your health care provider about which countries are considered high risk.  - Your parents were born in a high-risk country and you have not received a shot to protect against hepatitis B (hepatitis B vaccine).  - You have  HIV or AIDS.  - You use needles to inject street drugs.  - You live with, or have sex with, someone who has Hepatitis B.  - You get hemodialysis treatment.  - You take certain medicines for conditions like cancer, organ transplantation, and autoimmune conditions.   Hepatitis C blood testing is  recommended for all people born from [birth year] through 1965 and any individual with known risks for hepatitis C.   Practice safe sex. Use condoms and avoid high-risk sexual practices to reduce the spread of sexually transmitted infections (STIs). STIs include gonorrhea, chlamydia, syphilis, trichomonas, herpes, HPV, and human immunodeficiency virus (HIV). Herpes, HIV, and HPV are viral illnesses that have no cure. They can result in disability, cancer, and death. Sexually active women aged 46 years and younger should be checked for chlamydia. Older women with new or multiple partners should also be tested for chlamydia. Testing for other STIs is recommended if you are sexually active and at increased risk.   Osteoporosis is a disease in which the bones lose minerals and strength with aging. This can result in serious bone fractures or breaks. The risk of osteoporosis can be identified using a bone density scan. Women ages 101 years and over and women at risk for fractures or osteoporosis should discuss screening with their health care providers. Ask your health care provider whether you should take a calcium supplement or vitamin D to There are also several preventive steps women can take to avoid osteoporosis and resulting fractures or to keep osteoporosis from worsening. -->Recommendations include:  Eat a balanced diet high in fruits, vegetables, calcium, and vitamins.  Get enough calcium. The recommended total intake of is 1,200 mg daily; for best absorption, if taking supplements, divide doses into 250-500 mg doses throughout the day. Of the two types of calcium, calcium carbonate is best absorbed when taken with food but calcium citrate can be taken on an empty stomach.  Get enough vitamin D. [employer] and the [place] recommend at least 1,000 IU per day for women age 39 and over who are at risk of vitamin D deficiency. Vitamin D deficiency can be caused by inadequate sun exposure (for  example, those who live in [place]).  Avoid alcohol and smoking. Heavy alcohol intake (more than 7 drinks per week) increases the risk of falls and hip fracture and women smokers tend to lose bone more rapidly and have lower bone mass than nonsmokers. Stopping smoking is one of the most important changes women can make to improve their health and decrease risk for disease.  Be physically active every day. Weight-bearing exercise (for example, fast walking, hiking, jogging, and weight training) may strengthen bones or slow the rate of bone loss that comes with aging. Balancing and muscle-strengthening exercises can reduce the risk of falling and fracture.  Consider therapeutic medications. Currently, several types of effective drugs are available. Healthcare providers can recommend the type most appropriate for each woman.  Eliminate environmental factors that may contribute to accidents. Falls cause nearly 90% of all osteoporotic fractures, so reducing this risk is an important bone-health strategy. Measures include ample lighting, removing obstructions to walking, using nonskid rugs on floors, and placing mats and/or grab bars in showers.  Be aware of medication side effects. Some common medicines make bones weaker. These include a type of steroid drug called glucocorticoids used for arthritis and asthma, some antiseizure drugs, certain sleeping pills, treatments for endometriosis, and some cancer drugs. An overactive thyroid gland or using too  much thyroid hormone for an underactive thyroid can also be a problem. If you are taking these medicines, talk to your doctor about what you can do to help protect your bones.reduce the rate of osteoporosis.    Menopause can be associated with physical symptoms and risks. Hormone replacement therapy is available to decrease symptoms and risks. You should talk to your health care provider about whether hormone replacement therapy is right for  you.   Use sunscreen. Apply sunscreen liberally and repeatedly throughout the day. You should seek shade when your shadow is shorter than you. Protect yourself by wearing long sleeves, pants, a wide-brimmed hat, and sunglasses year round, whenever you are outdoors.   Once a month, do a whole body skin exam, using a mirror to look at the skin on your back. Tell your health care provider of new moles, moles that have irregular borders, moles that are larger than a pencil eraser, or moles that have changed in shape or color.   -Stay current with required vaccines (immunizations).   Influenza vaccine. All adults should be immunized every year.  Tetanus, diphtheria, and acellular pertussis (Td, Tdap) vaccine. Pregnant women should receive 1 dose of Tdap vaccine during each pregnancy. The dose should be obtained regardless of the length of time since the last dose. Immunization is preferred during the 27th 36th week of gestation. An adult who has not previously received Tdap or who does not know her vaccine status should receive 1 dose of Tdap. This initial dose should be followed by tetanus and diphtheria toxoids (Td) booster doses every 10 years. Adults with an unknown or incomplete history of completing a 3-dose immunization series with Td-containing vaccines should begin or complete a primary immunization series including a Tdap dose. Adults should receive a Td booster every 10 years.  Varicella vaccine. An adult without evidence of immunity to varicella should receive 2 doses or a second dose if she has previously received 1 dose. Pregnant females who do not have evidence of immunity should receive the first dose after pregnancy. This first dose should be obtained before leaving the health care facility. The second dose should be obtained 4 8 weeks after the first dose.  Human papillomavirus (HPV) vaccine. Females aged 32 26 years who have not received the vaccine previously should obtain the 3-dose  series. The vaccine is not recommended for use in pregnant females. However, pregnancy testing is not needed before receiving a dose. If a female is found to be pregnant after receiving a dose, no treatment is needed. In that case, the remaining doses should be delayed until after the pregnancy. Immunization is recommended for any person with an immunocompromised condition through the age of 80 years if she did not get any or all doses earlier. During the 3-dose series, the second dose should be obtained 4 8 weeks after the first dose. The third dose should be obtained 24 weeks after the first dose and 16 weeks after the second dose.  Zoster vaccine. One dose is recommended for adults aged 39 years or older unless certain conditions are present.  Measles, mumps, and rubella (MMR) vaccine. Adults born before [birth year] generally are considered immune to measles and mumps. Adults born in [birth year] or later should have 1 or more doses of MMR vaccine unless there is a contraindication to the vaccine or there is laboratory evidence of immunity to each of the three diseases. A routine second dose of MMR vaccine should be obtained at least 28 days after  the first dose for students attending postsecondary schools, health care workers, or international travelers. People who received inactivated measles vaccine or an unknown type of measles vaccine during 1963 1967 should receive 2 doses of MMR vaccine. People who received inactivated mumps vaccine or an unknown type of mumps vaccine before 1979 and are at high risk for mumps infection should consider immunization with 2 doses of MMR vaccine. For females of childbearing age, rubella immunity should be determined. If there is no evidence of immunity, females who are not pregnant should be vaccinated. If there is no evidence of immunity, females who are pregnant should delay immunization until after pregnancy. Unvaccinated health care workers born before [birth year] who lack laboratory  evidence of measles, mumps, or rubella immunity or laboratory confirmation of disease should consider measles and mumps immunization with 2 doses of MMR vaccine or rubella immunization with 1 dose of MMR vaccine.  Pneumococcal 13-valent conjugate (PCV13) vaccine. When indicated, a person who is uncertain of her immunization history and has no record of immunization should receive the PCV13 vaccine. An adult aged 7 years or older who has certain medical conditions and has not been previously immunized should receive 1 dose of PCV13 vaccine. This PCV13 should be followed with a dose of pneumococcal polysaccharide (PPSV23) vaccine. The PPSV23 vaccine dose should be obtained at least 8 weeks after the dose of PCV13 vaccine. An adult aged 97 years or older who has certain medical conditions and previously received 1 or more doses of PPSV23 vaccine should receive 1 dose of PCV13. The PCV13 vaccine dose should be obtained 1 or more years after the last PPSV23 vaccine dose.  Pneumococcal polysaccharide (PPSV23) vaccine. When PCV13 is also indicated, PCV13 should be obtained first. All adults aged 32 years and older should be immunized. An adult younger than age 17 years who has certain medical conditions should be immunized. Any person who resides in a nursing home or long-term care facility should be immunized. An adult smoker should be immunized. People with an immunocompromised condition and certain other conditions should receive both PCV13 and PPSV23 vaccines. People with human immunodeficiency virus (HIV) infection should be immunized as soon as possible after diagnosis. Immunization during chemotherapy or radiation therapy should be avoided. Routine use of PPSV23 vaccine is not recommended for American Indians, [place] Natives, or people younger than 65 years unless there are medical conditions that require PPSV23 vaccine. When indicated, people who have unknown immunization and have no record of immunization  should receive PPSV23 vaccine. One-time revaccination 5 years after the first dose of PPSV23 is recommended for people aged 34 64 years who have chronic kidney failure, nephrotic syndrome, asplenia, or immunocompromised conditions. People who received 1 2 doses of PPSV23 before age 40 years should receive another dose of PPSV23 vaccine at age 8 years or later if at least 5 years have passed since the previous dose. Doses of PPSV23 are not needed for people immunized with PPSV23 at or after age 15 years.  Meningococcal vaccine. Adults with asplenia or persistent complement component deficiencies should receive 2 doses of quadrivalent meningococcal conjugate (MenACWY-D) vaccine. The doses should be obtained at least 2 months apart. Microbiologists working with certain meningococcal bacteria, [employer] recruits, people at risk during an outbreak, and people who travel to or live in countries with a high rate of meningitis should be immunized. A first-year college student up through age 53 years who is living in a residence hall should receive a dose if she did not  receive a dose on or after her 16th birthday. Adults who have certain high-risk conditions should receive one or more doses of vaccine.  Hepatitis A vaccine. Adults who wish to be protected from this disease, have certain high-risk conditions, work with hepatitis A-infected animals, work in hepatitis A research labs, or travel to or work in countries with a high rate of hepatitis A should be immunized. Adults who were previously unvaccinated and who anticipate close contact with an international adoptee during the first 60 days after arrival in the [place] from a country with a high rate of hepatitis A should be immunized.  Hepatitis B vaccine.  Adults who wish to be protected from this disease, have certain high-risk conditions, may be exposed to blood or other infectious body fluids, are household contacts or sex partners of hepatitis B positive  people, are clients or workers in certain care facilities, or travel to or work in countries with a high rate of hepatitis B should be immunized.  Haemophilus influenzae type b (Hib) vaccine. A previously unvaccinated person with asplenia or sickle cell disease or having a scheduled splenectomy should receive 1 dose of Hib vaccine. Regardless of previous immunization, a recipient of a hematopoietic stem cell transplant should receive a 3-dose series 6 12 months after her successful transplant. Hib vaccine is not recommended for adults with HIV infection.  Preventive Services / Frequency Ages 59 to 39years  Blood pressure check.** / Every 1 to 2 years.  Lipid and cholesterol check.** / Every 5 years beginning at age 89.  Clinical breast exam.** / Every 3 years for women in their 22s and 21s.  BRCA-related cancer risk assessment.** / For women who have family members with a BRCA-related cancer (breast, ovarian, tubal, or peritoneal cancers).  Pap test.** / Every 2 years from ages 7 through 23. Every 3 years starting at age 40 through age 30 or 63 with a history of 3 consecutive normal Pap tests.  HPV screening.** / Every 3 years from ages 27 through ages 34 to 57 with a history of 3 consecutive normal Pap tests.  Hepatitis C blood test.** / For any individual with known risks for hepatitis C.  Skin self-exam. / Monthly.  Influenza vaccine. / Every year.  Tetanus, diphtheria, and acellular pertussis (Tdap, Td) vaccine.** / Consult your health care provider. Pregnant women should receive 1 dose of Tdap vaccine during each pregnancy. 1 dose of Td every 10 years.  Varicella vaccine.** / Consult your health care provider. Pregnant females who do not have evidence of immunity should receive the first dose after pregnancy.  HPV vaccine. / 3 doses over 6 months, if 75 and younger. The vaccine is not recommended for use in pregnant females. However, pregnancy testing is not needed before receiving  a dose.  Measles, mumps, rubella (MMR) vaccine.** / You need at least 1 dose of MMR if you were born in 1957 or later. You may also need a 2nd dose. For females of childbearing age, rubella immunity should be determined. If there is no evidence of immunity, females who are not pregnant should be vaccinated. If there is no evidence of immunity, females who are pregnant should delay immunization until after pregnancy.  Pneumococcal 13-valent conjugate (PCV13) vaccine.** / Consult your health care provider.  Pneumococcal polysaccharide (PPSV23) vaccine.** / 1 to 2 doses if you smoke cigarettes or if you have certain conditions.  Meningococcal vaccine.** / 1 dose if you are age 20 to 10 years and a [employer]  college student living in a residence hall, or have one of several medical conditions, you need to get vaccinated against meningococcal disease. You may also need additional booster doses.  Hepatitis A vaccine.** / Consult your health care provider.  Hepatitis B vaccine.** / Consult your health care provider.  Haemophilus influenzae type b (Hib) vaccine.** / Consult your health care provider.  Ages 15 to 64years  Blood pressure check.** / Every 1 to 2 years.  Lipid and cholesterol check.** / Every 5 years beginning at age 19 years.  Lung cancer screening. / Every year if you are aged 55 80 years and have a 30-pack-year history of smoking and currently smoke or have quit within the past 15 years. Yearly screening is stopped once you have quit smoking for at least 15 years or develop a health problem that would prevent you from having lung cancer treatment.  Clinical breast exam.** / Every year after age 60 years.  BRCA-related cancer risk assessment.** / For women who have family members with a BRCA-related cancer (breast, ovarian, tubal, or peritoneal cancers).  Mammogram.** / Every year beginning at age 41 years and continuing for as long as you are in good health. Consult with your  health care provider.  Pap test.** / Every 3 years starting at age 66 years through age 43 or 61 years with a history of 3 consecutive normal Pap tests.  HPV screening.** / Every 3 years from ages 6 years through ages 15 to 67 years with a history of 3 consecutive normal Pap tests.  Fecal occult blood test (FOBT) of stool. / Every year beginning at age 55 years and continuing until age 92 years. You may not need to do this test if you get a colonoscopy every 10 years.  Flexible sigmoidoscopy or colonoscopy.** / Every 5 years for a flexible sigmoidoscopy or every 10 years for a colonoscopy beginning at age 46 years and continuing until age 13 years.  Hepatitis C blood test.** / For all people born from [birth year] through 1967 and any individual with known risks for hepatitis C.  Skin self-exam. / Monthly.  Influenza vaccine. / Every year.  Tetanus, diphtheria, and acellular pertussis (Tdap/Td) vaccine.** / Consult your health care provider. Pregnant women should receive 1 dose of Tdap vaccine during each pregnancy. 1 dose of Td every 10 years.  Varicella vaccine.** / Consult your health care provider. Pregnant females who do not have evidence of immunity should receive the first dose after pregnancy.  Zoster vaccine.** / 1 dose for adults aged 25 years or older.  Measles, mumps, rubella (MMR) vaccine.** / You need at least 1 dose of MMR if you were born in 1957 or later. You may also need a 2nd dose. For females of childbearing age, rubella immunity should be determined. If there is no evidence of immunity, females who are not pregnant should be vaccinated. If there is no evidence of immunity, females who are pregnant should delay immunization until after pregnancy.  Pneumococcal 13-valent conjugate (PCV13) vaccine.** / Consult your health care provider.  Pneumococcal polysaccharide (PPSV23) vaccine.** / 1 to 2 doses if you smoke cigarettes or if you have certain conditions.  Meningococcal  vaccine.** / Consult your health care provider.  Hepatitis A vaccine.** / Consult your health care provider.  Hepatitis B vaccine.** / Consult your health care provider.  Haemophilus influenzae type b (Hib) vaccine.** / Consult your health care provider.  Ages 88 years and over  Blood pressure check.** / Every 1 to  2 years.  Lipid and cholesterol check.** / Every 5 years beginning at age 99 years.  Lung cancer screening. / Every year if you are aged 42 80 years and have a 30-pack-year history of smoking and currently smoke or have quit within the past 15 years. Yearly screening is stopped once you have quit smoking for at least 15 years or develop a health problem that would prevent you from having lung cancer treatment.  Clinical breast exam.** / Every year after age 58 years.  BRCA-related cancer risk assessment.** / For women who have family members with a BRCA-related cancer (breast, ovarian, tubal, or peritoneal cancers).  Mammogram.** / Every year beginning at age 59 years and continuing for as long as you are in good health. Consult with your health care provider.  Pap test.** / Every 3 years starting at age 62 years through age 95 or 26 years with 3 consecutive normal Pap tests. Testing can be stopped between 65 and 70 years with 3 consecutive normal Pap tests and no abnormal Pap or HPV tests in the past 10 years.  HPV screening.** / Every 3 years from ages 58 years through ages 31 or 69 years with a history of 3 consecutive normal Pap tests. Testing can be stopped between 65 and 70 years with 3 consecutive normal Pap tests and no abnormal Pap or HPV tests in the past 10 years.  Fecal occult blood test (FOBT) of stool. / Every year beginning at age 54 years and continuing until age 51 years. You may not need to do this test if you get a colonoscopy every 10 years.  Flexible sigmoidoscopy or colonoscopy.** / Every 5 years for a flexible sigmoidoscopy or every 10 years for a  colonoscopy beginning at age 26 years and continuing until age 11 years.  Hepatitis C blood test.** / For all people born from [birth year] through 1967 and any individual with known risks for hepatitis C.  Osteoporosis screening.** / A one-time screening for women ages 19 years and over and women at risk for fractures or osteoporosis.  Skin self-exam. / Monthly.  Influenza vaccine. / Every year.  Tetanus, diphtheria, and acellular pertussis (Tdap/Td) vaccine.** / 1 dose of Td every 10 years.  Varicella vaccine.** / Consult your health care provider.  Zoster vaccine.** / 1 dose for adults aged 105 years or older.  Pneumococcal 13-valent conjugate (PCV13) vaccine.** / Consult your health care provider.  Pneumococcal polysaccharide (PPSV23) vaccine.** / 1 dose for all adults aged 28 years and older.  Meningococcal vaccine.** / Consult your health care provider.  Hepatitis A vaccine.** / Consult your health care provider.  Hepatitis B vaccine.** / Consult your health care provider.  Haemophilus influenzae type b (Hib) vaccine.** / Consult your health care provider. ** Family history and personal history of risk and conditions may change your health care provider's recommendations. Document Released: 02/24/2001 Document Revised: 10/19/2012  [facility] Patient Information 2014 [place], [place].   EXERCISE AND DIET:  We recommended that you start or continue a regular exercise program for good health. Regular exercise means any activity that makes your heart beat faster and makes you sweat.  We recommend exercising at least 30 minutes per day at least 3 days a week, preferably 5.  We also recommend a diet low in fat and sugar / carbohydrates.  Inactivity, poor dietary choices and obesity can cause diabetes, heart attack, stroke, and kidney damage, among others.     ALCOHOL AND SMOKING:  Women should limit their  alcohol intake to no more than 7 drinks/beers/glasses of wine (combined, not each!) per  week. Moderation of alcohol intake to this level decreases your risk of breast cancer and liver damage.  ( And of course, no recreational drugs are part of a healthy lifestyle.)  Also, you should not be smoking at all or even being exposed to second hand smoke. Most people know smoking can cause cancer, and various heart and lung diseases, but did you know it also contributes to weakening of your bones?  Aging of your skin?  Yellowing of your teeth and nails?   CALCIUM AND VITAMIN D:  Adequate intake of calcium and Vitamin D are recommended.  The recommendations for exact amounts of these supplements seem to change often, but generally speaking 600 mg of calcium (either carbonate or citrate) and 800 units of Vitamin D per day seems prudent. Certain women may benefit from higher intake of Vitamin D.  If you are among these women, your doctor will have told you during your visit.     PAP SMEARS:  Pap smears, to check for cervical cancer or precancers,  have traditionally been done yearly, although recent scientific advances have shown that most women can have pap smears less often.  However, every woman still should have a physical exam from her gynecologist or primary care physician every year. It will include a breast check, inspection of the vulva and vagina to check for abnormal growths or skin changes, a visual exam of the cervix, and then an exam to evaluate the size and shape of the uterus and ovaries.  And after 64 years of age, a rectal exam is indicated to check for rectal cancers. We will also provide age appropriate advice regarding health maintenance, like when you should have certain vaccines, screening for sexually transmitted diseases, bone density testing, colonoscopy, mammograms, etc.    MAMMOGRAMS:  All women over 58 years old should have a yearly mammogram. Many facilities now offer a "3D" mammogram, which may cost around $50 extra out of pocket. If possible,  we recommend you accept  the option to have the 3D mammogram performed.  It both reduces the number of women who will be called back for extra views which then turn out to be normal, and it is better than the routine mammogram at detecting truly abnormal areas.     COLONOSCOPY:  Colonoscopy to screen for colon cancer is recommended for all women at age 75.  We know, you hate the idea of the prep.  We agree, BUT, having colon cancer and not knowing it is worse!!  Colon cancer so often starts as a polyp that can be seen and removed at colonscopy, which can quite literally save your life!  And if your first colonoscopy is normal and you have no family history of colon cancer, most women don't have to have it again for 10 years.  Once every ten years, you can do something that may end up saving your life, right?  We will be happy to help you get it scheduled when you are ready.  Be sure to check your insurance coverage so you understand how much it will cost.  It may be covered as a preventative service at no cost, but you should check your particular policy.

## 2017-11-12 NOTE — Progress Notes (Signed)
Impression and Recommendations:    1. Encounter for wellness examination   2. Health education/counseling   3. Acquired hypothyroidism   4. Osteopenia, unspecified location   5. Acquired iron deficiency anemia due to decreased absorption- due to rheum meds and tums etc   6. Leukocytosis, unspecified type   7. Rosacea   8. Osteopenia after menopause   9. Family history of colon cancer   10. Bilateral impacted cerumen      1) Anticipatory Guidance: Discussed importance of wearing a seatbelt while driving, not texting while driving; sunscreen when outside along with yearly skin surveillance; eating a well balanced and modest diet; physical activity at least 25 minutes per day or 150 min/ week of moderate to intense activity.  2) Immunizations / Screenings / Labs:  All immunizations and screenings that patient agrees to, are up-to-date per recommendations or will be updated today.  Patient understands the needs for q 22mo dental and yearly vision screens which pt will schedule independently. Obtain CBC, CMP, HgA1c, Lipid panel, TSH and vit D when fasting if not already done recently.   3) Weight:   Discussed goal of losing even 5-10% of current body weight which would improve overall feelings of well being and improve objective health data significantly.   Improve nutrient density of diet through increasing intake of fruits and vegetables and decreasing saturated/trans fats, white flour products and refined sugar products.   4) Hx of Rosacea:  -Patient has a 10 year hx of rosacea and has tried several topical and PO antibiotics.  -Will prescribe topical metronidazole gel to aid with rosacea flares.   5) Leukocytosis -Patient prior lab on 10/04/2017 showed elevated WBC at 11.6. Due to this, we will repeat her CBC with diff today.   6) Iron Deficiency Anemia:  -Patient follows up with Dr. Christella Hartigan. -Advised the patient to continue follow up with Dr. Christella Hartigan.   7) Osteopenia:  -Per  patient her last DEXA scan in July 2019 showed that she was osteopenic.  -We will obtain a vitamin D level today  8) Cerumen buildup:  Advised the patient to use 0.5 hydrogen peroxide and 0.5 rubbing alcohol to aid with cerumen buildup at least twice a week.   Meds ordered this encounter  Medications  . metroNIDAZOLE (METROGEL) 0.75 % gel    Sig: Apply 1 application topically 2 (two) times daily.    Dispense:  45 g    Refill:  1    Orders Placed This Encounter  Procedures  . CBC with Differential/Platelet  . Comprehensive metabolic panel  . Hemoglobin A1c  . Lipid panel  . T4, free  . TSH  . VITAMIN D 25 Hydroxy (Vit-D Deficiency, Fractures)    Gross side effects, risk and benefits, and alternatives of medications discussed with patient.  Patient is aware that all medications have potential side effects and we are unable to predict every side effect or drug-drug interaction that may occur.  Expresses verbal understanding and consents to current therapy plan and treatment regimen.  F-up in 6 months for a chronic OV. Follow up for preventative CPE in 1 year. F/up sooner for chronic care management as discussed and/or prn.  Please see orders placed and AVS handed out to patient at the end of our visit for further patient instructions/ counseling done pertaining to today's office visit.  This document serves as a record of services personally performed by Thomasene Lot, DO. It was created on her behalf by [employer], a trained medical scribe. The creation of this record is based on the scribe's personal observations and the provider's statements to them.   I have reviewed the above medical documentation for accuracy and completeness and I concur.  Thomasene Lot, D.O.     Subjective:    Chief Complaint  Patient presents with  . Annual Exam   CC:   HPI: Stacy Boyd is a 62 y.o. female who presents to [facility] today a yearly health  maintenance exam.    Health Maintenance:  She receives her mammogram and pap smear through an outside source and per patient all her results were normal. *We do not have access to these records and are waiting on these results*  She hasn't had her shringrex vaccine yet, however, she plans to obtain it and wants to ensure that insurance covers it.   She has been walking lately, however, she notes that she hasn't been able to walk as much due to osteoarthritis to her bilateral knees. She receives a gelsyn injection and her next injection is due on 11/25/2017 and is given by Dr. Tana Felts PA. She also completes weight bearing exercise through working on her garden at home on 2 acres.   She goes to the eye doctor regularly and denies a hx of glaucoma. She goes to her Dentist in [facility] every 6 months.    Hx of Rosacea:  She doesn't have a dermatologist, however, she reports that her rosacea is flaring up. She was given a prescription of medication to aid with her prior rosacea flare. She has had rosacea for 10 years, she has used topical antibiotics, sulfacetamide and metronizadole gel. She has once had ocular rosacea and she was treated with doxycycline that helped  Iron-Deficiency Anemia:  She is followed by Dr. Christella Hartigan for this and notes that she recently had labs on 10/04/2017.   She reports associated occasional optical HA (colorful zig-zag lines seen), excessive ear wax and she uses drops to aid with flushing her ears. She denies HA, vision changes, CP, SOB, dizziness, neurological symptoms, hematuria, bowel issues, leg swelling, leg pain, and any other symptoms.   Health Maintenance Summary Reviewed and updated, unless pt declines services.  Colonoscopy:   Last colonoscopy was normal on 09/28/2014, she is to return for a repeat in 5 years, September 2021 due to a family hx of colon cancer.  Tobacco History Reviewed:   Y  CT scan for screening lung CA:  Abdominal Ultrasound:     (  Unnecessary secondary to < 62 or > 60 years old) Alcohol:    No concerns, no excessive use Exercise Habits:   Not meeting AHA guidelines STD concerns:   none Drug Use:   None Birth control method:   n/a Menses regular:     n/a Lumps or breast concerns:      no Breast Cancer Family History:      No Bone/ DEXA scan:  Last DEXA scan in July 2019 and she is osteopenic per patient. Her rheumatologist suggest that the patient start Fosamax to aid with her osteopenia. She received her DEXA scan earlier due to a positive ANA test previously. She was on 5 mg steroids for several years prior to the DEXA scan being ordered. *We do not have access to these records and are waiting on these results*    Immunization History  Administered Date(s) Administered  . PPD Test 01/13/1979  . Pneumococcal-Unspecified 01/13/2003  . Td  01/13/2007  . Tdap 01/13/2007  . Zoster 08/29/2013    Health Maintenance  Topic Date Due  . Hepatitis C Screening  11/24/2017 (Originally 1955/04/12)  . HIV Screening  11/24/2017 (Originally 02/16/1970)  . TETANUS/TDAP  04/23/2018 (Originally 01/12/2017)  . MAMMOGRAM  12/24/2017  . PAP SMEAR  12/23/2018  . COLONOSCOPY  09/27/2024  . INFLUENZA VACCINE  Discontinued     Wt Readings from Last 3 Encounters:  11/12/17 194 lb (88 kg)  07/05/17 179 lb (81.2 kg)  04/22/17 175 lb 12.8 oz (79.7 kg)   BP Readings from Last 3 Encounters:  11/12/17 138/74  07/05/17 118/70  04/22/17 (!) 142/81   Pulse Readings from Last 3 Encounters:  11/12/17 61  07/05/17 72  04/22/17 (!) 58     Past Medical History:  Diagnosis Date  . Hypothyroid       Past Surgical History:  Procedure Laterality Date  . ABDOMINAL HYSTERECTOMY    . CESAREAN SECTION    . COLONOSCOPY        Family History  Problem Relation Age of Onset  . Colon cancer Father   . Breast cancer Sister   . Prostate cancer Brother   . Stomach cancer Neg Hx   . Rectal cancer Neg Hx   . Pancreatic cancer Neg Hx         Social History   Substance and Sexual Activity  Drug Use No  ,   Social History   Substance and Sexual Activity  Alcohol Use Yes  . Alcohol/week: 1.0 standard drinks  . Types: 1 Standard drinks or equivalent per week   Comment: occ  ,   Social History   Tobacco Use  Smoking Status Never Smoker  Smokeless Tobacco Never Used  ,   Social History   Substance and Sexual Activity  Sexual Activity Not Currently  . Birth control/protection: None    Current Outpatient Medications on File Prior to Visit  Medication Sig Dispense Refill  . Melatonin 2.5 MG CAPS Take 1 capsule at bedtime as needed by mouth.    . Multiple Vitamins-Minerals (CENTRUM SILVER 50+WOMEN PO) Take 1 tablet daily by mouth.    . Omega-3 Fatty Acids (FISH OIL) 1200 MG CAPS Take 1 capsule daily by mouth.    Marland Kitchen omeprazole (PRILOSEC) 20 MG capsule Take 1 capsule by mouth daily.    Bertram Gala Glycol-Propyl Glycol (SYSTANE) 0.4-0.3 % SOLN Apply daily to eye.    Marland Kitchen SPIRULINA PO Take 1 tablet by mouth daily.    Marland Kitchen testosterone cypionate (DEPOTESTOTERONE CYPIONATE) 100 MG/ML injection Inject 100 mg into the muscle. Every 10-12 weeks    . TURMERIC PO Take 1 tablet daily by mouth.    Marland Kitchen UNABLE TO FIND Take 1 capsule daily by mouth. Med Name: liothryonine sodium (t#)/thyroxine-L (T4) SR 21-89 mcg  1 capsule orally daily     Current Facility-Administered Medications on File Prior to Visit  Medication Dose Route Frequency Provider Last Rate Last Dose  . 0.9 %  sodium chloride infusion  500 mL Intravenous Once Rachael Fee, MD        Allergies: Erythromycin and Influenza vaccines  Review of Systems: General:   Denies fever, chills, unexplained weight loss.  Optho/Auditory:   Denies visual changes, blurred vision/LOV. (+) excessive ear wax Respiratory:   Denies SOB, DOE more than baseline levels.  Cardiovascular:   Denies chest pain, palpitations, new onset peripheral edema  Gastrointestinal:   Denies  nausea, vomiting, diarrhea.  Genitourinary: Denies dysuria,  freq/ urgency, flank pain or discharge from genitals.  Endocrine:     Denies hot or cold intolerance, polyuria, polydipsia. Musculoskeletal:   Denies unexplained myalgias, joint swelling, unexplained arthralgias, gait problems.  Skin:  Denies rash, suspicious lesions Neurological:     Denies dizziness, unexplained weakness, numbness. (+) occasional optical HA Psychiatric/Behavioral:   Denies mood changes, suicidal or homicidal ideations, hallucinations    Objective:    Blood pressure 138/74, pulse 61, temperature 97.9 F (36.6 C), height 5\' 7"  (1.702 m), weight 194 lb (88 kg), SpO2 98 %. Body mass index is 30.38 kg/m. General Appearance:    Alert, cooperative, no distress, appears stated age  Head:    Normocephalic, without obvious abnormality, atraumatic  Eyes:    PERRL, conjunctiva/corneas clear, EOM's intact, fundi    benign, both eyes  Ears:    Moderate amount of cerumen bilateral EACs. 1/4 visualization of the TM  Nose:   Nares normal, septum midline, mucosa normal, no drainage    or sinus tenderness  Throat:   Lips w/o lesion, mucosa moist, and tongue normal; teeth and   gums normal  Neck:   Supple, symmetrical, trachea midline, no adenopathy;    thyroid:  no enlargement/tenderness/nodules; no carotid   bruit or JVD  Back:     Symmetric, no curvature, ROM normal, no CVA tenderness  Lungs:     Clear to auscultation bilaterally, respirations unlabored, no       Wh/ R/ R  Chest Wall:    No tenderness or gross deformity; normal excursion   Heart:    Regular rate and rhythm, S1 and S2 normal, no murmur, rub   or gallop  Breast Exam:    No tenderness, masses, or nipple abnormality b/l; no d/c  Abdomen:     Soft, non-tender, bowel sounds active all four quadrants, NO   G/R/R, no masses, no organomegaly  Genitalia:    Ext genitalia: without lesion, no rash or discharge, No         tenderness;  Cervix: WNL's w/o discharge or  lesion;        Adnexa:  No tenderness or palpable masses   Rectal:    Normal tone, no masses or tenderness;   guaiac negative stool  Extremities:   Extremities normal, atraumatic, no cyanosis or gross edema  Pulses:   2+ and symmetric all extremities  Skin:   Warm, dry, Skin color, texture, turgor normal, no obvious rashes or lesions Psych: No HI/SI, judgement and insight good, Euthymic mood. Full Affect.  Neurologic:   CNII-XII intact, normal strength, sensation and reflexes    Throughout

## 2017-11-13 LAB — LIPID PANEL
Chol/HDL Ratio: 3.1 ratio (ref 0.0–4.4)
Cholesterol, Total: 216 mg/dL — ABNORMAL HIGH (ref 100–199)
HDL: 69 mg/dL (ref >39)
LDL Calculated: 123 mg/dL — ABNORMAL HIGH (ref 0–99)
Triglycerides: 119 mg/dL (ref 0–149)
VLDL Cholesterol Cal: 24 mg/dL (ref 5–40)

## 2017-11-13 LAB — COMPREHENSIVE METABOLIC PANEL
ALT: 24 IU/L (ref 0–32)
AST: 22 IU/L (ref 0–40)
Albumin/Globulin Ratio: 2.1 (ref 1.2–2.2)
Albumin: 4.5 g/dL (ref 3.6–4.8)
Alkaline Phosphatase: 94 IU/L (ref 39–117)
BUN/Creatinine Ratio: 23 (ref 12–28)
BUN: 17 mg/dL (ref 8–27)
Bilirubin Total: 0.5 mg/dL (ref 0.0–1.2)
CO2: 25 mmol/L (ref 20–29)
Calcium: 9.3 mg/dL (ref 8.7–10.3)
Chloride: 103 mmol/L (ref 96–106)
Creatinine, Ser: 0.74 mg/dL (ref 0.57–1.00)
GFR calc Af Amer: 99 mL/min/{1.73_m2} (ref >59)
GFR calc non Af Amer: 86 mL/min/{1.73_m2} (ref >59)
Globulin, Total: 2.1 g/dL (ref 1.5–4.5)
Glucose: 101 mg/dL — ABNORMAL HIGH (ref 65–99)
Potassium: 4.4 mmol/L (ref 3.5–5.2)
Sodium: 144 mmol/L (ref 134–144)
Total Protein: 6.6 g/dL (ref 6.0–8.5)

## 2017-11-13 LAB — CBC WITH DIFFERENTIAL/PLATELET
Basophils Absolute: 0.1 10*3/uL (ref 0.0–0.2)
Basos: 1 %
EOS (ABSOLUTE): 0.2 10*3/uL (ref 0.0–0.4)
Eos: 4 %
Hematocrit: 43.2 % (ref 34.0–46.6)
Hemoglobin: 14.7 g/dL (ref 11.1–15.9)
Immature Grans (Abs): 0 10*3/uL (ref 0.0–0.1)
Immature Granulocytes: 0 %
Lymphocytes Absolute: 1.5 10*3/uL (ref 0.7–3.1)
Lymphs: 30 %
MCH: 31.8 pg (ref 26.6–33.0)
MCHC: 34 g/dL (ref 31.5–35.7)
MCV: 94 fL (ref 79–97)
Monocytes Absolute: 0.6 10*3/uL (ref 0.1–0.9)
Monocytes: 11 %
Neutrophils Absolute: 2.7 10*3/uL (ref 1.4–7.0)
Neutrophils: 54 %
Platelets: 323 10*3/uL (ref 150–450)
RBC: 4.62 x10E6/uL (ref 3.77–5.28)
RDW: 13.1 % (ref 12.3–15.4)
WBC: 5 10*3/uL (ref 3.4–10.8)

## 2017-11-13 LAB — TSH: TSH: 2.26 u[IU]/mL (ref 0.450–4.500)

## 2017-11-13 LAB — T4, FREE: Free T4: 0.91 ng/dL (ref 0.82–1.77)

## 2017-11-13 LAB — VITAMIN D 25 HYDROXY (VIT D DEFICIENCY, FRACTURES): Vit D, 25-Hydroxy: 31.5 ng/mL (ref 30.0–100.0)

## 2017-11-13 LAB — HEMOGLOBIN A1C
Est. average glucose Bld gHb Est-mCnc: 114 mg/dL
Hgb A1c MFr Bld: 5.6 % (ref 4.8–5.6)

## 2017-11-25 DIAGNOSIS — M170 Bilateral primary osteoarthritis of knee: Secondary | ICD-10-CM | POA: Diagnosis not present

## 2017-11-26 ENCOUNTER — Ambulatory Visit (INDEPENDENT_AMBULATORY_CARE_PROVIDER_SITE_OTHER): Payer: BLUE CROSS/BLUE SHIELD

## 2017-11-26 DIAGNOSIS — Z23 Encounter for immunization: Secondary | ICD-10-CM | POA: Diagnosis not present

## 2017-11-26 NOTE — Progress Notes (Signed)
HPI:  Patient is here for Shingrix (zoster) and Tdap vaccinations. Screening Checklist for Contraindications completed by pt and reviewed.  No contraindications  A&P:  Patient tolerated injection well without complications.  Tiajuana Amass, CMA

## 2017-12-03 DIAGNOSIS — M170 Bilateral primary osteoarthritis of knee: Secondary | ICD-10-CM | POA: Diagnosis not present

## 2017-12-14 DIAGNOSIS — M25561 Pain in right knee: Secondary | ICD-10-CM | POA: Insufficient documentation

## 2017-12-14 DIAGNOSIS — M170 Bilateral primary osteoarthritis of knee: Secondary | ICD-10-CM | POA: Diagnosis not present

## 2017-12-14 DIAGNOSIS — M25562 Pain in left knee: Secondary | ICD-10-CM | POA: Insufficient documentation

## 2017-12-14 DIAGNOSIS — M1712 Unilateral primary osteoarthritis, left knee: Secondary | ICD-10-CM | POA: Diagnosis present

## 2017-12-17 DIAGNOSIS — R6882 Decreased libido: Secondary | ICD-10-CM | POA: Diagnosis not present

## 2017-12-27 DIAGNOSIS — M810 Age-related osteoporosis without current pathological fracture: Secondary | ICD-10-CM | POA: Diagnosis not present

## 2018-01-04 DIAGNOSIS — E039 Hypothyroidism, unspecified: Secondary | ICD-10-CM | POA: Diagnosis not present

## 2018-01-04 DIAGNOSIS — Z1231 Encounter for screening mammogram for malignant neoplasm of breast: Secondary | ICD-10-CM | POA: Diagnosis not present

## 2018-01-04 DIAGNOSIS — Z01419 Encounter for gynecological examination (general) (routine) without abnormal findings: Secondary | ICD-10-CM | POA: Diagnosis not present

## 2018-01-04 DIAGNOSIS — Z6831 Body mass index (BMI) 31.0-31.9, adult: Secondary | ICD-10-CM | POA: Diagnosis not present

## 2018-02-22 ENCOUNTER — Other Ambulatory Visit: Payer: Self-pay

## 2018-02-22 DIAGNOSIS — D508 Other iron deficiency anemias: Secondary | ICD-10-CM

## 2018-02-22 DIAGNOSIS — R6882 Decreased libido: Secondary | ICD-10-CM | POA: Diagnosis not present

## 2018-02-25 ENCOUNTER — Ambulatory Visit (INDEPENDENT_AMBULATORY_CARE_PROVIDER_SITE_OTHER): Payer: PPO

## 2018-02-25 VITALS — BP 126/77 | HR 71 | Temp 98.6°F

## 2018-02-25 DIAGNOSIS — Z23 Encounter for immunization: Secondary | ICD-10-CM

## 2018-02-25 NOTE — Progress Notes (Signed)
Pt here for pneumococcal and Shingrix vaccines.  Screening questionnaire reviewed, VIS provided to patient, and any/all patient questions answered.  Tiajuana Amass, CMA

## 2018-03-02 DIAGNOSIS — M810 Age-related osteoporosis without current pathological fracture: Secondary | ICD-10-CM | POA: Diagnosis not present

## 2018-03-02 DIAGNOSIS — R768 Other specified abnormal immunological findings in serum: Secondary | ICD-10-CM | POA: Diagnosis not present

## 2018-03-02 DIAGNOSIS — M3500 Sicca syndrome, unspecified: Secondary | ICD-10-CM | POA: Diagnosis not present

## 2018-03-02 DIAGNOSIS — M25569 Pain in unspecified knee: Secondary | ICD-10-CM | POA: Diagnosis not present

## 2018-03-02 DIAGNOSIS — M170 Bilateral primary osteoarthritis of knee: Secondary | ICD-10-CM | POA: Diagnosis not present

## 2018-03-16 DIAGNOSIS — E039 Hypothyroidism, unspecified: Secondary | ICD-10-CM | POA: Diagnosis not present

## 2018-04-13 ENCOUNTER — Ambulatory Visit: Payer: BLUE CROSS/BLUE SHIELD | Admitting: Family Medicine

## 2018-04-28 ENCOUNTER — Other Ambulatory Visit (INDEPENDENT_AMBULATORY_CARE_PROVIDER_SITE_OTHER): Payer: PPO

## 2018-04-28 DIAGNOSIS — M1712 Unilateral primary osteoarthritis, left knee: Secondary | ICD-10-CM | POA: Diagnosis not present

## 2018-04-28 DIAGNOSIS — D508 Other iron deficiency anemias: Secondary | ICD-10-CM

## 2018-04-28 DIAGNOSIS — M170 Bilateral primary osteoarthritis of knee: Secondary | ICD-10-CM | POA: Diagnosis not present

## 2018-04-28 DIAGNOSIS — M1711 Unilateral primary osteoarthritis, right knee: Secondary | ICD-10-CM | POA: Diagnosis not present

## 2018-04-28 LAB — CBC WITH DIFFERENTIAL/PLATELET
Basophils Absolute: 0.1 10*3/uL (ref 0.0–0.1)
Basophils Relative: 0.9 % (ref 0.0–3.0)
Eosinophils Absolute: 0.2 10*3/uL (ref 0.0–0.7)
Eosinophils Relative: 2.8 % (ref 0.0–5.0)
HCT: 44.6 % (ref 36.0–46.0)
Hemoglobin: 15.1 g/dL — ABNORMAL HIGH (ref 12.0–15.0)
Lymphocytes Relative: 27.4 % (ref 12.0–46.0)
Lymphs Abs: 2 10*3/uL (ref 0.7–4.0)
MCHC: 33.9 g/dL (ref 30.0–36.0)
MCV: 93.1 fl (ref 78.0–100.0)
Monocytes Absolute: 0.7 10*3/uL (ref 0.1–1.0)
Monocytes Relative: 10.3 % (ref 3.0–12.0)
Neutro Abs: 4.2 10*3/uL (ref 1.4–7.7)
Neutrophils Relative %: 58.6 % (ref 43.0–77.0)
Platelets: 318 10*3/uL (ref 150.0–400.0)
RBC: 4.8 Mil/uL (ref 3.87–5.11)
RDW: 14.4 % (ref 11.5–15.5)
WBC: 7.2 10*3/uL (ref 4.0–10.5)

## 2018-05-11 DIAGNOSIS — R6882 Decreased libido: Secondary | ICD-10-CM | POA: Diagnosis not present

## 2018-08-12 DIAGNOSIS — R6882 Decreased libido: Secondary | ICD-10-CM | POA: Diagnosis not present

## 2018-08-19 DIAGNOSIS — M25561 Pain in right knee: Secondary | ICD-10-CM | POA: Diagnosis not present

## 2018-08-19 DIAGNOSIS — M1712 Unilateral primary osteoarthritis, left knee: Secondary | ICD-10-CM | POA: Diagnosis not present

## 2018-08-19 DIAGNOSIS — M170 Bilateral primary osteoarthritis of knee: Secondary | ICD-10-CM | POA: Diagnosis not present

## 2018-08-19 DIAGNOSIS — M25562 Pain in left knee: Secondary | ICD-10-CM | POA: Diagnosis not present

## 2018-08-19 DIAGNOSIS — M1711 Unilateral primary osteoarthritis, right knee: Secondary | ICD-10-CM | POA: Diagnosis not present

## 2018-08-26 DIAGNOSIS — M1712 Unilateral primary osteoarthritis, left knee: Secondary | ICD-10-CM | POA: Diagnosis not present

## 2018-08-26 DIAGNOSIS — M25561 Pain in right knee: Secondary | ICD-10-CM | POA: Diagnosis not present

## 2018-08-26 DIAGNOSIS — M25562 Pain in left knee: Secondary | ICD-10-CM | POA: Diagnosis not present

## 2018-08-26 DIAGNOSIS — M170 Bilateral primary osteoarthritis of knee: Secondary | ICD-10-CM | POA: Diagnosis not present

## 2018-08-26 DIAGNOSIS — M1711 Unilateral primary osteoarthritis, right knee: Secondary | ICD-10-CM | POA: Diagnosis not present

## 2018-08-31 DIAGNOSIS — M3500 Sicca syndrome, unspecified: Secondary | ICD-10-CM | POA: Diagnosis not present

## 2018-08-31 DIAGNOSIS — M25569 Pain in unspecified knee: Secondary | ICD-10-CM | POA: Diagnosis not present

## 2018-08-31 DIAGNOSIS — R768 Other specified abnormal immunological findings in serum: Secondary | ICD-10-CM | POA: Diagnosis not present

## 2018-08-31 DIAGNOSIS — M810 Age-related osteoporosis without current pathological fracture: Secondary | ICD-10-CM | POA: Diagnosis not present

## 2018-08-31 DIAGNOSIS — M170 Bilateral primary osteoarthritis of knee: Secondary | ICD-10-CM | POA: Diagnosis not present

## 2018-09-02 DIAGNOSIS — M170 Bilateral primary osteoarthritis of knee: Secondary | ICD-10-CM | POA: Diagnosis not present

## 2018-09-02 DIAGNOSIS — M25562 Pain in left knee: Secondary | ICD-10-CM | POA: Diagnosis not present

## 2018-09-02 DIAGNOSIS — M25561 Pain in right knee: Secondary | ICD-10-CM | POA: Diagnosis not present

## 2018-11-03 DIAGNOSIS — R6882 Decreased libido: Secondary | ICD-10-CM | POA: Diagnosis not present

## 2018-12-02 DIAGNOSIS — M25562 Pain in left knee: Secondary | ICD-10-CM | POA: Diagnosis not present

## 2018-12-02 DIAGNOSIS — M25561 Pain in right knee: Secondary | ICD-10-CM | POA: Diagnosis not present

## 2018-12-02 DIAGNOSIS — M1712 Unilateral primary osteoarthritis, left knee: Secondary | ICD-10-CM | POA: Diagnosis not present

## 2018-12-02 DIAGNOSIS — M1711 Unilateral primary osteoarthritis, right knee: Secondary | ICD-10-CM | POA: Diagnosis not present

## 2018-12-02 DIAGNOSIS — M170 Bilateral primary osteoarthritis of knee: Secondary | ICD-10-CM | POA: Diagnosis not present

## 2018-12-06 DIAGNOSIS — H35381 Toxic maculopathy, right eye: Secondary | ICD-10-CM | POA: Diagnosis not present

## 2018-12-06 DIAGNOSIS — H02831 Dermatochalasis of right upper eyelid: Secondary | ICD-10-CM | POA: Diagnosis not present

## 2018-12-06 DIAGNOSIS — H35389 Toxic maculopathy, unspecified eye: Secondary | ICD-10-CM | POA: Diagnosis not present

## 2018-12-06 DIAGNOSIS — H2513 Age-related nuclear cataract, bilateral: Secondary | ICD-10-CM | POA: Diagnosis not present

## 2018-12-06 DIAGNOSIS — H40033 Anatomical narrow angle, bilateral: Secondary | ICD-10-CM | POA: Diagnosis not present

## 2018-12-06 DIAGNOSIS — H0102A Squamous blepharitis right eye, upper and lower eyelids: Secondary | ICD-10-CM | POA: Diagnosis not present

## 2018-12-06 DIAGNOSIS — H0102B Squamous blepharitis left eye, upper and lower eyelids: Secondary | ICD-10-CM | POA: Diagnosis not present

## 2018-12-06 DIAGNOSIS — H04123 Dry eye syndrome of bilateral lacrimal glands: Secondary | ICD-10-CM | POA: Diagnosis not present

## 2018-12-06 DIAGNOSIS — H40012 Open angle with borderline findings, low risk, left eye: Secondary | ICD-10-CM | POA: Diagnosis not present

## 2018-12-06 DIAGNOSIS — H02834 Dermatochalasis of left upper eyelid: Secondary | ICD-10-CM | POA: Diagnosis not present

## 2018-12-06 DIAGNOSIS — H25013 Cortical age-related cataract, bilateral: Secondary | ICD-10-CM | POA: Diagnosis not present

## 2018-12-29 DIAGNOSIS — M810 Age-related osteoporosis without current pathological fracture: Secondary | ICD-10-CM | POA: Diagnosis not present

## 2019-01-04 DIAGNOSIS — M1711 Unilateral primary osteoarthritis, right knee: Secondary | ICD-10-CM | POA: Diagnosis not present

## 2019-01-04 DIAGNOSIS — M25561 Pain in right knee: Secondary | ICD-10-CM | POA: Diagnosis not present

## 2019-01-26 DIAGNOSIS — R6882 Decreased libido: Secondary | ICD-10-CM | POA: Diagnosis not present

## 2019-03-07 ENCOUNTER — Telehealth: Payer: Self-pay | Admitting: Family Medicine

## 2019-03-07 NOTE — Telephone Encounter (Signed)
Patient hasn't been seen since 2019 & called to schedule MWE & lab appt-- advised her that appt for MWE now performed as Virtual OV & lab orders need reviewed by PCP & med asst before scheduling that appt---  --Forwarding note to med asst for Labs orders review  & addition before MWE.  --glh

## 2019-03-07 NOTE — Telephone Encounter (Signed)
Advised patient she and Opalski can discuss labs during MW and place future order for labs then. AS, CMA

## 2019-03-14 DIAGNOSIS — Z6830 Body mass index (BMI) 30.0-30.9, adult: Secondary | ICD-10-CM | POA: Diagnosis not present

## 2019-03-14 DIAGNOSIS — Z124 Encounter for screening for malignant neoplasm of cervix: Secondary | ICD-10-CM | POA: Diagnosis not present

## 2019-03-14 DIAGNOSIS — E039 Hypothyroidism, unspecified: Secondary | ICD-10-CM | POA: Diagnosis not present

## 2019-03-15 LAB — T4, FREE: Free T4: 1.2

## 2019-03-15 LAB — T3 UPTAKE: T3 Uptake: 24

## 2019-03-15 LAB — T4 (THYROXINE), TOTAL (REFL): T4, Total: 5

## 2019-03-15 LAB — TSH: TSH: 0.92 (ref 0.41–5.90)

## 2019-03-24 DIAGNOSIS — M170 Bilateral primary osteoarthritis of knee: Secondary | ICD-10-CM | POA: Diagnosis not present

## 2019-04-03 ENCOUNTER — Encounter: Payer: Self-pay | Admitting: Family Medicine

## 2019-04-03 ENCOUNTER — Other Ambulatory Visit: Payer: Self-pay

## 2019-04-03 ENCOUNTER — Ambulatory Visit (INDEPENDENT_AMBULATORY_CARE_PROVIDER_SITE_OTHER): Payer: PPO | Admitting: Family Medicine

## 2019-04-03 VITALS — BP 139/84 | Ht 66.5 in | Wt 190.0 lb

## 2019-04-03 DIAGNOSIS — Z1159 Encounter for screening for other viral diseases: Secondary | ICD-10-CM

## 2019-04-03 DIAGNOSIS — E669 Obesity, unspecified: Secondary | ICD-10-CM | POA: Diagnosis not present

## 2019-04-03 DIAGNOSIS — E039 Hypothyroidism, unspecified: Secondary | ICD-10-CM

## 2019-04-03 DIAGNOSIS — D508 Other iron deficiency anemias: Secondary | ICD-10-CM | POA: Diagnosis not present

## 2019-04-03 DIAGNOSIS — R7989 Other specified abnormal findings of blood chemistry: Secondary | ICD-10-CM | POA: Diagnosis not present

## 2019-04-03 DIAGNOSIS — Z0000 Encounter for general adult medical examination without abnormal findings: Secondary | ICD-10-CM

## 2019-04-03 NOTE — Progress Notes (Addendum)
Subjective:   Stacy Boyd is a 64 y.o. female who presents for Medicare Annual (Subsequent) preventive examination.   HPI: Denies concerns about memory today. States "the nurse went through all of that with me this morning, so we're all good on that." Notes "I live a very active lifestyle, and my falls are normal." Confirms that her fall over the past year was due to an accident, tripping, etc. Says "I'm an extreme gardener, and that takes about every ounce of energy that I have." She gardens about two acres' worth of land. Notes "I can bend from the waist and weed for hours," and she shovels a Boyd, which she believes helps strengthen her back. She has difficulty climbing a flight of stairs. Notes "I have really bad arthritis in my knees, and I see Dr. Lequita Boyd at [facility]." She receives regular cortisone injections in her knees, and adds "we have opened the dialogue about a possible knee replacement in the fall." She is also wondering if she should start going to physical therapy in preparation for a knee replacement. Notes "I feel like my weight isn't really good, and I'd like to know your opinion." To help avoid snacking, she has been intermittent fasting for about a year, "but not really strictly." Notes she gained a little weight this winter, and has maintained her weight otherwise. Says she would love to lose 50 lbs for her knees. Her preferred exercise is her gardening as discussed. In the winter time, states "I know I should be weight lifting, and I haven't been doing that." She obtains her reclast from Dr. Kathi Boyd, and her thyroid medication from Dr. Vincente Boyd.     Objective:     Vitals: BP 139/84 Comment: unable to obtain  Ht 5' 6.5" (1.689 m)   Wt 190 lb (86.2 kg)   BMI 30.21 kg/m   Body mass index is 30.21 kg/m.  No flowsheet data found.  Tobacco Social History   Tobacco Use  Smoking Status Never Smoker  Smokeless Tobacco Never Used     Counseling given: Not  Answered    Past Medical History:  Diagnosis Date  . Hypothyroid    Past Surgical History:  Procedure Laterality Date  . ABDOMINAL HYSTERECTOMY    . CESAREAN SECTION    . COLONOSCOPY     Family History  Problem Relation Age of Onset  . Colon cancer Father   . Breast cancer Sister   . Prostate cancer Brother   . Stomach cancer Neg Hx   . Rectal cancer Neg Hx   . Pancreatic cancer Neg Hx    Social History   Socioeconomic History  . Marital status: Divorced    Spouse name: Not on file  . Number of children: Not on file  . Years of education: Not on file  . Highest education level: Not on file  Occupational History  . Occupation: Dance movement psychotherapist. Horticulturist, commercial: Production assistant, radio FOR SELF EMPLOYED  Tobacco Use  . Smoking status: Never Smoker  . Smokeless tobacco: Never Used  Substance and Sexual Activity  . Alcohol use: Yes    Alcohol/week: 1.0 standard drinks    Types: 1 Standard drinks or equivalent per week    Comment: occ  . Drug use: No  . Sexual activity: Not Currently    Birth control/protection: None  Other Topics Concern  . Not on file  Social History Narrative  . Not on file   Social Determinants of Health  Financial Resource Strain:   . Difficulty of Paying Living Expenses:   Food Insecurity:   . Worried About Programme researcher, broadcasting/film/video in the Last Year:   . Barista in the Last Year:   Transportation Needs:   . Freight forwarder (Medical):   Marland Kitchen Lack of Transportation (Non-Medical):   Physical Activity:   . Days of Exercise per Week:   . Minutes of Exercise per Session:   Stress:   . Feeling of Stress :   Social Connections:   . Frequency of Communication with Friends and Family:   . Frequency of Social Gatherings with Friends and Family:   . Attends Religious Services:   . Active Member of Clubs or Organizations:   . Attends Banker Meetings:   Marland Kitchen Marital Status:     Outpatient Encounter Medications as of 04/03/2019   Medication Sig  . Melatonin 2.5 MG CAPS Take 1 capsule at bedtime as needed by mouth.  . Multiple Vitamins-Minerals (CENTRUM SILVER 50+WOMEN PO) Take 1 tablet daily by mouth.  . Omega-3 Fatty Acids (FISH OIL) 1200 MG CAPS Take 1 capsule daily by mouth.  Bertram Gala Glycol-Propyl Glycol (SYSTANE) 0.4-0.3 % SOLN Apply daily to eye.  Marland Kitchen SPIRULINA PO Take 1 tablet by mouth daily.  Marland Kitchen testosterone cypionate (DEPOTESTOTERONE CYPIONATE) 100 MG/ML injection Inject 100 mg into the muscle. Every 10-12 weeks  . TURMERIC PO Take 1 tablet daily by mouth.  Marland Kitchen UNABLE TO FIND Take 1 capsule by mouth daily. Med Name: liothryonine sodium (t#)/thyroxine-L (T4) SR 30-100 mcg  1 capsule orally daily  . metroNIDAZOLE (METROGEL) 0.75 % gel Apply 1 application topically 2 (two) times daily. (Patient not taking: Reported on 04/03/2019)  . [DISCONTINUED] omeprazole (PRILOSEC) 20 MG capsule Take 1 capsule by mouth daily.   Facility-Administered Encounter Medications as of 04/03/2019  Medication  . 0.9 %  sodium chloride infusion    Activities of Daily Living In your present state of health, do you have any difficulty performing the following activities: 04/03/2019  Hearing? N  Vision? Y  Difficulty concentrating or making decisions? N  Walking or climbing stairs? Y  Dressing or bathing? N  Doing errands, shopping? N  Some recent data might be hidden    Patient Care Team: Stacy Lot, DO as PCP - General (Family Medicine) Stacy Muskrat, MD (Rheumatology) Stacy Overlie, MD as Consulting Physician (Obstetrics and Gynecology) Stacy Fee, MD as Attending Physician (Gastroenterology) Stacy Rakes, MD as Consulting Physician (Gastroenterology)    Assessment:   This is a routine wellness examination for Stacy Boyd.  Fall Risk Fall Risk  04/03/2019 11/12/2017 04/22/2017 02/16/2017 11/24/2016  Falls in the past year? 1 0 No No No  Number falls in past yr: 0 - - - -  Injury with Fall? 0 - - - -  Follow  up Falls evaluation completed - - - -   Is the patient's home free of loose throw rugs in walkways, pet beds, electrical cords, etc?   yes      Grab bars in the bathroom? yes      Handrails on the stairs?   yes      Adequate lighting?   yes  Timed Get Up and Go performed: Telehealth  Depression Screen PHQ 2/9 Scores 04/03/2019 11/12/2017 04/22/2017 02/16/2017  PHQ - 2 Score 0 0 0 0  PHQ- 9 Score 4 3 2 4      Cognitive Function     6CIT Screen 04/03/2019  What Year? 0 points  What month? 0 points  What time? 0 points  Count back from 20 0 points  Months in reverse 0 points  Repeat phrase 0 points  Total Score 0    Immunization History  Administered Date(s) Administered  . PPD Test 01/13/1979  . Pneumococcal Conjugate-13 02/25/2018  . Pneumococcal-Unspecified 01/13/2003  . Td 01/13/2007  . Tdap 01/13/2007, 11/26/2017  . Zoster 08/29/2013  . Zoster Recombinat (Shingrix) 11/26/2017, 02/25/2018    Qualifies for Shingles Vaccine?UTD Pneumococcal: UTD Tdap: UTD COVID: UTD  Screening Tests Health Maintenance  Topic Date Due  . MAMMOGRAM  12/24/2017  . PNA vac Low Risk Adult (2 of 2 - PPSV23) 02/26/2019  . COLONOSCOPY  09/27/2024  . TETANUS/TDAP  11/27/2027  . DEXA SCAN  Completed  . Hepatitis C Screening  Completed  . INFLUENZA VACCINE  Discontinued    Cancer Screenings: Lung: Low Dose CT Chest recommended if Age 41-80 years, 30 pack-year currently smoking OR have quit w/in 15years. Patient does not qualify. Breast:  Up to date on Mammogram? Yes   Up to date of Bone Density/Dexa? No Colorectal: UTD  Additional Screenings: Hepatitis C Screening: Ordered HIV Screening: Declined       Plan:  Medicare Wellness 10:00 AM 04/03/2019 PLAN: - Reviewed need for screenings and immunizations with patient today according to health maintenance guidelines. - For female health, patient follows up with Dr. Vincente Boyd of OBGYN. - Last mammogram on record obtained in 2017. Per pt,  mammogram has been normal for many years. - Per patient, has obtained mammograms yearly, with 2021 mammogram scheduled for May. - Per patient, obtains pap smears with Dr. Vincente Boyd every two years. - Per patient, last DEXA obtained last year. - Per patient, obtains DEXA scans every other year through rheumatology. - Obtains reclast infusions through Dr. Kathi Boyd of rheumatology. -  Records requested AGAIN today. - TDAP up to date. - Shingles vaccine up to date. - Pneumonia vaccine up to date. - Last colonoscopy obtained in 9 of 2016, up to date, with repeat recommended in 5 years. - Per patient, was following up every 5 years in the past due to first degree family history of colon cancer (father). - Recommended that patient call her specialist to schedule her colonoscopy as advised. - Reviewed need for low-risk Hep C screen along with upcoming blood work near future. - Patient declines low-risk HIV screen. - To help achieve and maintain a healthy weight, encouraged patient to adopt a Mediterranean diet and engage in regular cardiovascular activity. Encouraged patient to engage in activities such as speed-walking, at least 30 minutes per day, 5 day sper week, for a total of 150 minutes of moderate cardiovascular activity per week as recommended by AHA guidelines. To help prevent osteoporosis, encouraged patient to engage in prudent weight lifting and weight-bearing exercises. - Advised patient to look into the use of tools such as LoseIt or MyFitnessPal to help track her nutrition, or joining a program such as Weight Watchers if she desires. - Health counseling provided and all questions answered.  I have personally reviewed and noted the following in the patient's chart:   . Medical and social history . Use of alcohol, tobacco or illicit drugs  . Current medications and supplements . Functional ability and status . Nutritional status . Physical activity . Advanced directives . List of other  physicians . Hospitalizations, surgeries, and ER visits in previous 12 months . Vitals . Screenings to include cognitive, depression, and falls .  Referrals and appointments  In addition, I have reviewed and discussed with patient certain preventive protocols, quality metrics, and best practice recommendations. A written personalized care plan for preventive services as well as general preventive health recommendations were provided to patient.

## 2019-04-05 ENCOUNTER — Other Ambulatory Visit: Payer: PPO

## 2019-04-05 ENCOUNTER — Other Ambulatory Visit: Payer: Self-pay

## 2019-04-05 DIAGNOSIS — Z0000 Encounter for general adult medical examination without abnormal findings: Secondary | ICD-10-CM

## 2019-04-05 DIAGNOSIS — Z1159 Encounter for screening for other viral diseases: Secondary | ICD-10-CM | POA: Diagnosis not present

## 2019-04-05 DIAGNOSIS — R7989 Other specified abnormal findings of blood chemistry: Secondary | ICD-10-CM

## 2019-04-05 DIAGNOSIS — E039 Hypothyroidism, unspecified: Secondary | ICD-10-CM | POA: Diagnosis not present

## 2019-04-05 DIAGNOSIS — D508 Other iron deficiency anemias: Secondary | ICD-10-CM | POA: Diagnosis not present

## 2019-04-06 LAB — CBC
Hematocrit: 44.7 % (ref 34.0–46.6)
Hemoglobin: 15 g/dL (ref 11.1–15.9)
MCH: 32.3 pg (ref 26.6–33.0)
MCHC: 33.6 g/dL (ref 31.5–35.7)
MCV: 96 fL (ref 79–97)
Platelets: 331 10*3/uL (ref 150–450)
RBC: 4.65 x10E6/uL (ref 3.77–5.28)
RDW: 12.9 % (ref 11.7–15.4)
WBC: 7.1 10*3/uL (ref 3.4–10.8)

## 2019-04-06 LAB — COMPREHENSIVE METABOLIC PANEL
ALT: 33 IU/L — ABNORMAL HIGH (ref 0–32)
AST: 26 IU/L (ref 0–40)
Albumin/Globulin Ratio: 2 (ref 1.2–2.2)
Albumin: 4.7 g/dL (ref 3.8–4.8)
Alkaline Phosphatase: 87 IU/L (ref 39–117)
BUN/Creatinine Ratio: 21 (ref 12–28)
BUN: 15 mg/dL (ref 8–27)
Bilirubin Total: 0.4 mg/dL (ref 0.0–1.2)
CO2: 23 mmol/L (ref 20–29)
Calcium: 9.6 mg/dL (ref 8.7–10.3)
Chloride: 101 mmol/L (ref 96–106)
Creatinine, Ser: 0.73 mg/dL (ref 0.57–1.00)
GFR calc Af Amer: 99 mL/min/{1.73_m2} (ref >59)
GFR calc non Af Amer: 86 mL/min/{1.73_m2} (ref >59)
Globulin, Total: 2.4 g/dL (ref 1.5–4.5)
Glucose: 97 mg/dL (ref 65–99)
Potassium: 4.8 mmol/L (ref 3.5–5.2)
Sodium: 140 mmol/L (ref 134–144)
Total Protein: 7.1 g/dL (ref 6.0–8.5)

## 2019-04-06 LAB — LIPID PANEL
Chol/HDL Ratio: 3 ratio (ref 0.0–4.4)
Cholesterol, Total: 234 mg/dL — ABNORMAL HIGH (ref 100–199)
HDL: 77 mg/dL (ref >39)
LDL Chol Calc (NIH): 135 mg/dL — ABNORMAL HIGH (ref 0–99)
Triglycerides: 126 mg/dL (ref 0–149)
VLDL Cholesterol Cal: 22 mg/dL (ref 5–40)

## 2019-04-06 LAB — HEMOGLOBIN A1C
Est. average glucose Bld gHb Est-mCnc: 123 mg/dL
Hgb A1c MFr Bld: 5.9 % — ABNORMAL HIGH (ref 4.8–5.6)

## 2019-04-06 LAB — VITAMIN D 25 HYDROXY (VIT D DEFICIENCY, FRACTURES): Vit D, 25-Hydroxy: 51.8 ng/mL (ref 30.0–100.0)

## 2019-04-06 LAB — HEPATITIS C ANTIBODY: Hep C Virus Ab: <0.1 s/co ratio (ref 0.0–0.9)

## 2019-04-18 DIAGNOSIS — R6882 Decreased libido: Secondary | ICD-10-CM | POA: Diagnosis not present

## 2019-04-25 DIAGNOSIS — M179 Osteoarthritis of knee, unspecified: Secondary | ICD-10-CM | POA: Diagnosis not present

## 2019-04-25 DIAGNOSIS — M170 Bilateral primary osteoarthritis of knee: Secondary | ICD-10-CM | POA: Diagnosis not present

## 2019-05-02 DIAGNOSIS — M170 Bilateral primary osteoarthritis of knee: Secondary | ICD-10-CM | POA: Diagnosis not present

## 2019-05-09 DIAGNOSIS — M170 Bilateral primary osteoarthritis of knee: Secondary | ICD-10-CM | POA: Diagnosis not present

## 2019-05-09 DIAGNOSIS — M179 Osteoarthritis of knee, unspecified: Secondary | ICD-10-CM | POA: Diagnosis not present

## 2019-05-15 DIAGNOSIS — Z1231 Encounter for screening mammogram for malignant neoplasm of breast: Secondary | ICD-10-CM | POA: Diagnosis not present

## 2019-06-29 DIAGNOSIS — R6882 Decreased libido: Secondary | ICD-10-CM | POA: Diagnosis not present

## 2019-07-21 DIAGNOSIS — M25562 Pain in left knee: Secondary | ICD-10-CM | POA: Diagnosis not present

## 2019-07-21 DIAGNOSIS — M1712 Unilateral primary osteoarthritis, left knee: Secondary | ICD-10-CM | POA: Diagnosis not present

## 2019-07-21 DIAGNOSIS — M1711 Unilateral primary osteoarthritis, right knee: Secondary | ICD-10-CM | POA: Diagnosis not present

## 2019-07-21 DIAGNOSIS — M170 Bilateral primary osteoarthritis of knee: Secondary | ICD-10-CM | POA: Diagnosis not present

## 2019-07-21 DIAGNOSIS — M25561 Pain in right knee: Secondary | ICD-10-CM | POA: Diagnosis not present

## 2019-08-09 DIAGNOSIS — M170 Bilateral primary osteoarthritis of knee: Secondary | ICD-10-CM | POA: Diagnosis not present

## 2019-08-09 DIAGNOSIS — M25569 Pain in unspecified knee: Secondary | ICD-10-CM | POA: Diagnosis not present

## 2019-08-09 DIAGNOSIS — R768 Other specified abnormal immunological findings in serum: Secondary | ICD-10-CM | POA: Diagnosis not present

## 2019-08-09 DIAGNOSIS — M1990 Unspecified osteoarthritis, unspecified site: Secondary | ICD-10-CM | POA: Diagnosis not present

## 2019-08-09 DIAGNOSIS — M810 Age-related osteoporosis without current pathological fracture: Secondary | ICD-10-CM | POA: Diagnosis not present

## 2019-08-09 DIAGNOSIS — M3500 Sicca syndrome, unspecified: Secondary | ICD-10-CM | POA: Diagnosis not present

## 2019-09-05 DIAGNOSIS — R6882 Decreased libido: Secondary | ICD-10-CM | POA: Diagnosis not present

## 2019-10-11 ENCOUNTER — Encounter: Payer: Self-pay | Admitting: Physician Assistant

## 2019-10-11 ENCOUNTER — Other Ambulatory Visit: Payer: Self-pay

## 2019-10-11 ENCOUNTER — Ambulatory Visit (INDEPENDENT_AMBULATORY_CARE_PROVIDER_SITE_OTHER): Payer: PPO | Admitting: Physician Assistant

## 2019-10-11 VITALS — BP 111/71 | HR 59 | Ht 66.5 in | Wt 194.8 lb

## 2019-10-11 DIAGNOSIS — E039 Hypothyroidism, unspecified: Secondary | ICD-10-CM | POA: Diagnosis not present

## 2019-10-11 DIAGNOSIS — H6123 Impacted cerumen, bilateral: Secondary | ICD-10-CM

## 2019-10-11 DIAGNOSIS — M170 Bilateral primary osteoarthritis of knee: Secondary | ICD-10-CM

## 2019-10-11 DIAGNOSIS — M3500 Sicca syndrome, unspecified: Secondary | ICD-10-CM

## 2019-10-11 DIAGNOSIS — Z01818 Encounter for other preprocedural examination: Secondary | ICD-10-CM | POA: Diagnosis not present

## 2019-10-11 DIAGNOSIS — Z23 Encounter for immunization: Secondary | ICD-10-CM | POA: Diagnosis not present

## 2019-10-11 DIAGNOSIS — E079 Disorder of thyroid, unspecified: Secondary | ICD-10-CM | POA: Insufficient documentation

## 2019-10-11 LAB — POCT URINALYSIS DIPSTICK
Bilirubin, UA: NEGATIVE
Blood, UA: NEGATIVE
Glucose, UA: NEGATIVE
Ketones, UA: NEGATIVE
Leukocytes, UA: NEGATIVE
Nitrite, UA: NEGATIVE
Protein, UA: NEGATIVE
Spec Grav, UA: 1.02 (ref 1.010–1.025)
Urobilinogen, UA: 0.2 E.U./dL
pH, UA: 5.5 (ref 5.0–8.0)

## 2019-10-11 NOTE — Progress Notes (Signed)
Established Patient Office Visit  Subjective:  Patient ID: Stacy Boyd, female    DOB: 1955/12/29  Age: 64 y.o. MRN: [id number]  CC:  Chief Complaint  Patient presents with  . Medical Clearance    HPI Stacy Boyd presents for presurgical clearance. Patient is having total knee replacement of right knee and eventually will also have left knee. Has no acute concerns today. Patient is not on an anticoagulant. No past medical history of diabetes, hypertension, sleep apnea, or coronary heart disease. Patient is followed by endocrinology for Sjgren and osteoporosis. Patient's Ob-Gyn manages hypothyroidism and low testosterone.  Past Medical History:  Diagnosis Date  . Hypothyroid     Past Surgical History:  Procedure Laterality Date  . ABDOMINAL HYSTERECTOMY    . CESAREAN SECTION    . COLONOSCOPY      Family History  Problem Relation Age of Onset  . Colon cancer Father   . Breast cancer Sister   . Prostate cancer Brother   . Stomach cancer Neg Hx   . Rectal cancer Neg Hx   . Pancreatic cancer Neg Hx     Social History   Socioeconomic History  . Marital status: Divorced    Spouse name: Not on file  . Number of children: Not on file  . Years of education: Not on file  . Highest education level: Not on file  Occupational History  . Occupation: Dance movement psychotherapist. Horticulturist, commercial: Production assistant, radio FOR SELF EMPLOYED  Tobacco Use  . Smoking status: Never Smoker  . Smokeless tobacco: Never Used  Vaping Use  . Vaping Use: Never used  Substance and Sexual Activity  . Alcohol use: Yes    Alcohol/week: 1.0 standard drink    Types: 1 Standard drinks or equivalent per week    Comment: occ  . Drug use: No  . Sexual activity: Not Currently    Birth control/protection: None  Other Topics Concern  . Not on file  Social History Narrative  . Not on file   Social Determinants of Health   Financial Resource Strain:   . Difficulty of Paying Living Expenses: Not on file   Food Insecurity:   . Worried About Programme researcher, broadcasting/film/video in the Last Year: Not on file  . Ran Out of Food in the Last Year: Not on file  Transportation Needs:   . Lack of Transportation (Medical): Not on file  . Lack of Transportation (Non-Medical): Not on file  Physical Activity:   . Days of Exercise per Week: Not on file  . Minutes of Exercise per Session: Not on file  Stress:   . Feeling of Stress : Not on file  Social Connections:   . Frequency of Communication with Friends and Family: Not on file  . Frequency of Social Gatherings with Friends and Family: Not on file  . Attends Religious Services: Not on file  . Active Member of Clubs or Organizations: Not on file  . Attends Banker Meetings: Not on file  . Marital Status: Not on file  Intimate Partner Violence:   . Fear of Current or Ex-Partner: Not on file  . Emotionally Abused: Not on file  . Physically Abused: Not on file  . Sexually Abused: Not on file    Outpatient Medications Prior to Visit  Medication Sig Dispense Refill  . acetaminophen (TYLENOL) 500 MG tablet Take 1,000 mg by mouth every 6 (six) hours as needed.    . Melatonin 2.5 MG CAPS  Take 1 capsule at bedtime as needed by mouth.    . Multiple Vitamins-Minerals (CENTRUM SILVER 50+WOMEN PO) Take 1 tablet daily by mouth.    . Omega-3 Fatty Acids (FISH OIL) 1200 MG CAPS Take 1 capsule daily by mouth.    Bertram Gala Glycol-Propyl Glycol (SYSTANE) 0.4-0.3 % SOLN Apply daily to eye.    Marland Kitchen SPIRULINA PO Take 1 tablet by mouth daily.    Marland Kitchen testosterone cypionate (DEPOTESTOTERONE CYPIONATE) 100 MG/ML injection Inject 100 mg into the muscle. Every 10-12 weeks    . TURMERIC PO Take 1 tablet daily by mouth.    Marland Kitchen UNABLE TO FIND Take 1 capsule by mouth daily. Med Name: liothryonine sodium (t#)/thyroxine-L (T4) SR 30-100 mcg  1 capsule orally daily    . metroNIDAZOLE (METROGEL) 0.75 % gel Apply 1 application topically 2 (two) times daily. (Patient not taking:  Reported on 04/03/2019) 45 g 1   Facility-Administered Medications Prior to Visit  Medication Dose Route Frequency Provider Last Rate Last Admin  . 0.9 %  sodium chloride infusion  500 mL Intravenous Once Rachael Fee, MD        Allergies  Allergen Reactions  . Erythromycin Hives and Nausea Only  . Influenza Vaccines Hives    ROS Review of Systems Review of Systems:  A fourteen system review of systems was performed and found to be positive as per HPI.  Objective:    Physical Exam Constitutional:      General: She is not in acute distress.    Appearance: Normal appearance.  HENT:     Head: Normocephalic and atraumatic.     Right Ear: There is impacted cerumen.     Left Ear: There is impacted cerumen.     Nose: Nose normal.     Mouth/Throat:     Pharynx: Oropharynx is clear. No oropharyngeal exudate or posterior oropharyngeal erythema.     Comments: Mildly dry Eyes:     Extraocular Movements: Extraocular movements intact.     Conjunctiva/sclera: Conjunctivae normal.     Pupils: Pupils are equal, round, and reactive to light.  Cardiovascular:     Rate and Rhythm: Normal rate and regular rhythm.     Pulses: Normal pulses.     Heart sounds: Normal heart sounds. No murmur heard.   Pulmonary:     Effort: Pulmonary effort is normal.     Breath sounds: Normal breath sounds. No wheezing, rhonchi or rales.  Abdominal:     General: There is no distension.     Palpations: Abdomen is soft.  Musculoskeletal:        General: No deformity.     Cervical back: Normal range of motion and neck supple. No tenderness.  Lymphadenopathy:     Cervical: No cervical adenopathy.  Skin:    General: Skin is warm and dry.     Capillary Refill: Capillary refill takes less than 2 seconds.     Coloration: Skin is not jaundiced.     Findings: No erythema.  Neurological:     General: No focal deficit present.     Mental Status: She is alert and oriented to person, place, and time.     Deep  Tendon Reflexes: Reflexes normal.  Psychiatric:        Mood and Affect: Mood normal.        Behavior: Behavior normal.        Thought Content: Thought content normal.        Judgment: Judgment normal.  BP 111/71   Pulse (!) 59   Ht 5' 6.5" (1.689 m)   Wt 194 lb 12.8 oz (88.4 kg)   SpO2 98%   BMI 30.97 kg/m  Wt Readings from Last 3 Encounters:  10/11/19 194 lb 12.8 oz (88.4 kg)  04/03/19 190 lb (86.2 kg)  11/12/17 194 lb (88 kg)     Health Maintenance Due  Topic Date Due  . MAMMOGRAM  12/24/2017  . PNA vac Low Risk Adult (2 of 2 - PPSV23) 02/26/2019    There are no preventive care reminders to display for this patient.  Lab Results  Component Value Date   TSH 0.92 03/15/2019   Lab Results  Component Value Date   WBC 6.0 10/11/2019   HGB 15.1 10/11/2019   HCT 43.6 10/11/2019   MCV 94 10/11/2019   PLT 321 10/11/2019   Lab Results  Component Value Date   NA 141 10/11/2019   K 4.8 10/11/2019   CO2 22 10/11/2019   GLUCOSE 93 10/11/2019   BUN 17 10/11/2019   CREATININE 0.82 10/11/2019   BILITOT 0.4 04/05/2019   ALKPHOS 87 04/05/2019   AST 26 04/05/2019   ALT 33 (H) 04/05/2019   PROT 7.1 04/05/2019   ALBUMIN 4.6 10/11/2019   CALCIUM 9.8 10/11/2019   Lab Results  Component Value Date   CHOL 234 (H) 04/05/2019   Lab Results  Component Value Date   HDL 77 04/05/2019   Lab Results  Component Value Date   LDLCALC 135 (H) 04/05/2019   Lab Results  Component Value Date   TRIG 126 04/05/2019   Lab Results  Component Value Date   CHOLHDL 3.0 04/05/2019   Lab Results  Component Value Date   HGBA1C 5.8 (H) 10/11/2019      Assessment & Plan:   Problem List Items Addressed This Visit      Endocrine   Acquired hypothyroidism (Chronic)     Musculoskeletal and Integument   Osteoarthritis, knee   Relevant Medications   acetaminophen (TYLENOL) 500 MG tablet     Other   Sjogren's disease (HCC)    Other Visit Diagnoses    Preoperative  clearance    -  Primary   Relevant Orders   CBC (Completed)   Basic Metabolic Panel (BMET) (Completed)   Albumin (Completed)   Protime-INR (Completed)   Hemoglobin A1c (Completed)   POCT urinalysis dipstick (Completed)   Need for influenza vaccination       Relevant Orders   Flu vaccine HIGH DOSE PF (Fluzone High dose) (Completed)   Bilateral impacted cerumen         Preoperative Clearance, Osteoarthritis of both knees: -Will obtain requested labs and pending results will complete presurgical form. Vital signs and physical exam within normal limits.  Sjogren's disease: -Followed by Endocrinology. -Stable with conservative management, currently not on immunosuppressive therapy.  Acquired hypothyroidism: -Followed by Ob-Gyn, Dr. Marcelle Overlie -Last thyroid labs wnl -Continue current medication regimen.  Indication: Cerumen impaction of both ears Medical necessity statement:  On physical examination, cerumen impairs clinically significant portions of the external auditory canal, and tympanic membrane.   Consent:  Discussed benefits and risks of procedure and verbal consent obtained Procedure:   Patient was prepped for the procedure.  Utilized an otoscope to assess and take note of the ear canal, the tympanic membrane, and the presence, amount, and placement of the cerumen. Gentle water irrigation and soft plastic curette was utilized to remove cerumen. Post procedure examination:  shows  cerumen was removed, without trauma or injury to the ear canal or TM, which remains intact.   Post-Procedural Ear Care Instructions:    Patient tolerated procedure well.  Proper ear care d/c pt.   The patient is made aware that they may experience temporary vertigo, temporary hearing loss, and temporary discomfort.  If these symptom last for more than 24 hours to call the clinic or proceed to the ED/Urgent Care.   No orders of the defined types were placed in this encounter.   Follow-up: Return in  about 6 months (around 04/09/2020) for MCW and FBW.   Note:  This note was prepared with assistance of Dragon voice recognition software. Occasional wrong-word or sound-a-like substitutions may have occurred due to the inherent limitations of voice recognition software.   Mayer Masker, PA-C

## 2019-10-12 LAB — BASIC METABOLIC PANEL
BUN/Creatinine Ratio: 21 (ref 12–28)
BUN: 17 mg/dL (ref 8–27)
CO2: 22 mmol/L (ref 20–29)
Calcium: 9.8 mg/dL (ref 8.7–10.3)
Chloride: 102 mmol/L (ref 96–106)
Creatinine, Ser: 0.82 mg/dL (ref 0.57–1.00)
GFR calc Af Amer: 86 mL/min/{1.73_m2} (ref >59)
GFR calc non Af Amer: 75 mL/min/{1.73_m2} (ref >59)
Glucose: 93 mg/dL (ref 65–99)
Potassium: 4.8 mmol/L (ref 3.5–5.2)
Sodium: 141 mmol/L (ref 134–144)

## 2019-10-12 LAB — CBC
Hematocrit: 43.6 % (ref 34.0–46.6)
Hemoglobin: 15.1 g/dL (ref 11.1–15.9)
MCH: 32.7 pg (ref 26.6–33.0)
MCHC: 34.6 g/dL (ref 31.5–35.7)
MCV: 94 fL (ref 79–97)
Platelets: 321 10*3/uL (ref 150–450)
RBC: 4.62 x10E6/uL (ref 3.77–5.28)
RDW: 13.2 % (ref 11.7–15.4)
WBC: 6 10*3/uL (ref 3.4–10.8)

## 2019-10-12 LAB — PROTIME-INR

## 2019-10-12 LAB — ALBUMIN: Albumin: 4.6 g/dL (ref 3.8–4.8)

## 2019-10-12 LAB — HEMOGLOBIN A1C
Est. average glucose Bld gHb Est-mCnc: 120 mg/dL
Hgb A1c MFr Bld: 5.8 % — ABNORMAL HIGH (ref 4.8–5.6)

## 2019-10-24 DIAGNOSIS — M170 Bilateral primary osteoarthritis of knee: Secondary | ICD-10-CM | POA: Diagnosis not present

## 2019-10-24 DIAGNOSIS — M25561 Pain in right knee: Secondary | ICD-10-CM | POA: Diagnosis not present

## 2019-10-24 DIAGNOSIS — M1712 Unilateral primary osteoarthritis, left knee: Secondary | ICD-10-CM | POA: Diagnosis not present

## 2019-11-13 NOTE — Patient Instructions (Addendum)
DUE TO COVID-19 ONLY ONE VISITOR IS ALLOWED TO COME WITH YOU AND STAY IN THE WAITING ROOM ONLY DURING PRE OP AND PROCEDURE.   IF YOU WILL BE ADMITTED INTO THE HOSPITAL YOU ARE ALLOWED ONE SUPPORT PERSON DURING VISITATION HOURS ONLY (10AM -8PM)   . The support person may change daily. . The support person must pass our screening, gel in and out, and wear a mask at all times, including in the patient's room. . Patients must also wear a mask when staff or their support person are in the room.   COVID SWAB TESTING MUST BE COMPLETED ON:  Thursday, 11-16-19 @ 1:05 PM   [address] [place], [place] [address]  (Must self quarantine after testing. Follow instructions on handout.)   Your procedure is scheduled on:  Monday, 11-20-19   Report to [facility] Main  Entrance   Report to Short Stay at 5:50 AM   [facility])   Call this number if you have problems the morning of surgery [phone]   Do not eat food :After Midnight.   May have liquids until 5:15 AM day of surgery  CLEAR LIQUID DIET  Foods Allowed                                                                     Foods Excluded  Water, Black Coffee and tea, regular and decaf              liquids that you cannot  Plain Jell-O in any flavor  (No red)                                    see through such as: Fruit ices (not with fruit pulp)                                      milk, soups, orange juice              Iced Popsicles (No red)                                      All solid food                                   Apple juices Sports drinks like Gatorade (No red) Lightly seasoned clear broth or consume(fat free) Sugar, honey syrup     Complete one Ensure drink the morning of surgery at 5:15 AM  the day of surgery.  Oral Hygiene is also important to reduce your risk of infection.                                    Remember - BRUSH YOUR TEETH THE MORNING OF SURGERY WITH YOUR REGULAR TOOTHPASTE   Do NOT smoke  after Midnight   Take these medicines the morning of surgery  with A SIP OF WATER: Liothyronine, Pepcid                               You may not have any metal on your body including hair pins, jewelry, and body piercings             Do not wear make-up, lotions, powders, perfumes/cologne, or deodorant             Do not wear nail polish.  Do not shave  48 hours prior to surgery.            Do not bring valuables to the hospital. [facility] IS NOT  RESPONSIBLE   FOR VALUABLES.   Contacts, dentures or bridgework may not be worn into surgery.   Bring small overnight bag day of surgery.                  Please read over the following fact sheets you were given: IF YOU HAVE QUESTIONS ABOUT YOUR PRE OP INSTRUCTIONS PLEASE CALL [phone]   [facility] - Preparing for Surgery Before surgery, you can play an important role.  Because skin is not sterile, your skin needs to be as free of germs as possible.  You can reduce the number of germs on your skin by washing with CHG (chlorahexidine gluconate) soap before surgery.  CHG is an antiseptic cleaner which kills germs and bonds with the skin to continue killing germs even after washing. Please DO NOT use if you have an allergy to CHG or antibacterial soaps.  If your skin becomes reddened/irritated stop using the CHG and inform your nurse when you arrive at Short Stay. Do not shave (including legs and underarms) for at least 48 hours prior to the first CHG shower.  You may shave your face/neck.  Please follow these instructions carefully:  1.  Shower with CHG Soap the night before surgery and the  morning of surgery.  2.  If you choose to wash your hair, wash your hair first as usual with your normal  shampoo.  3.  After you shampoo, rinse your hair and body thoroughly to remove the shampoo.                             4.  Use CHG as you would any other liquid soap.  You can apply chg directly to the skin and wash.  Gently with a scrungie or  clean washcloth.  5.  Apply the CHG Soap to your body ONLY FROM THE NECK DOWN.   Do   not use on face/ open                           Wound or open sores. Avoid contact with eyes, ears mouth and   genitals (private parts).                       Wash face,  Genitals (private parts) with your normal soap.             6.  Wash thoroughly, paying special attention to the area where your    surgery  will be performed.  7.  Thoroughly rinse your body with warm water from the neck down.  8.  DO NOT shower/wash with your normal soap after using and rinsing off  the CHG Soap.                9.  Pat yourself dry with a clean towel.            10.  Wear clean pajamas.            11.  Place clean sheets on your bed the night of your first shower and do not  sleep with pets. Day of Surgery : Do not apply any lotions/deodorants the morning of surgery.  Please wear clean clothes to the hospital/surgery center.  FAILURE TO FOLLOW THESE INSTRUCTIONS MAY RESULT IN THE CANCELLATION OF YOUR SURGERY  PATIENT SIGNATURE_________________________________  NURSE SIGNATURE__________________________________  ________________________________________________________________________   Stacy Boyd  An incentive spirometer is a tool that can help keep your lungs clear and active. This tool measures how well you are filling your lungs with each breath. Taking long deep breaths may help reverse or decrease the chance of developing breathing (pulmonary) problems (especially infection) following:  A long period of time when you are unable to move or be active. BEFORE THE PROCEDURE   If the spirometer includes an indicator to show your best effort, your nurse or respiratory therapist will set it to a desired goal.  If possible, sit up straight or lean slightly forward. Try not to slouch.  Hold the incentive spirometer in an upright position. INSTRUCTIONS FOR USE  1. Sit on the edge of your bed if possible, or  sit up as far as you can in bed or on a chair. 2. Hold the incentive spirometer in an upright position. 3. Breathe out normally. 4. Place the mouthpiece in your mouth and seal your lips tightly around it. 5. Breathe in slowly and as deeply as possible, raising the piston or the ball toward the top of the column. 6. Hold your breath for 3-5 seconds or for as long as possible. Allow the piston or ball to fall to the bottom of the column. 7. Remove the mouthpiece from your mouth and breathe out normally. 8. Rest for a few seconds and repeat Steps 1 through 7 at least 10 times every 1-2 hours when you are awake. Take your time and take a few normal breaths between deep breaths. 9. The spirometer may include an indicator to show your best effort. Use the indicator as a goal to work toward during each repetition. 10. After each set of 10 deep breaths, practice coughing to be sure your lungs are clear. If you have an incision (the cut made at the time of surgery), support your incision when coughing by placing a pillow or rolled up towels firmly against it. Once you are able to get out of bed, walk around indoors and cough well. You may stop using the incentive spirometer when instructed by your caregiver.  RISKS AND COMPLICATIONS  Take your time so you do not get dizzy or light-headed.  If you are in pain, you may need to take or ask for pain medication before doing incentive spirometry. It is harder to take a deep breath if you are having pain. AFTER USE  Rest and breathe slowly and easily.  It can be helpful to keep track of a log of your progress. Your caregiver can provide you with a simple table to help with this. If you are using the spirometer at home, follow these instructions: SEEK MEDICAL CARE IF:   You are having difficultly using the spirometer.  You have trouble using the spirometer as  often as instructed.  Your pain medication is not giving enough relief while using the  spirometer.  You develop fever of 100.5 F (38.1 C) or higher. SEEK IMMEDIATE MEDICAL CARE IF:   You cough up bloody sputum that had not been present before.  You develop fever of 102 F (38.9 C) or greater.  You develop worsening pain at or near the incision site. MAKE SURE YOU:   Understand these instructions.  Will watch your condition.  Will get help right away if you are not doing well or get worse. Document Released: 05/11/2006 Document Revised: 03/23/2011 Document Reviewed: 07/12/2006 Exit[employer] Patient Information 2014 Exit[employer], [place].   ________________________________________________________________________  WHAT IS A BLOOD TRANSFUSION? Blood Transfusion Information  A transfusion is the replacement of blood or some of its parts. Blood is made up of multiple cells which provide different functions.  Red blood cells carry oxygen and are used for blood loss replacement.  White blood cells fight against infection.  Platelets control bleeding.  Plasma helps clot blood.  Other blood products are available for specialized needs, such as hemophilia or other clotting disorders. BEFORE THE TRANSFUSION  Who gives blood for transfusions?   Healthy volunteers who are fully evaluated to make sure their blood is safe. This is blood bank blood. Transfusion therapy is the safest it has ever been in the practice of medicine. Before blood is taken from a donor, a complete history is taken to make sure that person has no history of diseases nor engages in risky social behavior (examples are intravenous drug use or sexual activity with multiple partners). The donor's travel history is screened to minimize risk of transmitting infections, such as malaria. The donated blood is tested for signs of infectious diseases, such as HIV and hepatitis. The blood is then tested to be sure it is compatible with you in order to minimize the chance of a transfusion reaction. If you or a relative  donates blood, this is often done in anticipation of surgery and is not appropriate for emergency situations. It takes many days to process the donated blood. RISKS AND COMPLICATIONS Although transfusion therapy is very safe and saves many lives, the main dangers of transfusion include:   Getting an infectious disease.  Developing a transfusion reaction. This is an allergic reaction to something in the blood you were given. Every precaution is taken to prevent this. The decision to have a blood transfusion has been considered carefully by your caregiver before blood is given. Blood is not given unless the benefits outweigh the risks. AFTER THE TRANSFUSION  Right after receiving a blood transfusion, you will usually feel much better and more energetic. This is especially true if your red blood cells have gotten low (anemic). The transfusion raises the level of the red blood cells which carry oxygen, and this usually causes an energy increase.  The nurse administering the transfusion will monitor you carefully for complications. HOME CARE INSTRUCTIONS  No special instructions are needed after a transfusion. You may find your energy is better. Speak with your caregiver about any limitations on activity for underlying diseases you may have. SEEK MEDICAL CARE IF:   Your condition is not improving after your transfusion.  You develop redness or irritation at the intravenous (IV) site. SEEK IMMEDIATE MEDICAL CARE IF:  Any of the following symptoms occur over the next 12 hours:  Shaking chills.  You have a temperature by mouth above 102 F (38.9 C), not controlled by medicine.  Chest, back, or  muscle pain.  People around you feel you are not acting correctly or are confused.  Shortness of breath or difficulty breathing.  Dizziness and fainting.  You get a rash or develop hives.  You have a decrease in urine output.  Your urine turns a dark color or changes to pink, red, or brown. Any  of the following symptoms occur over the next 10 days:  You have a temperature by mouth above 102 F (38.9 C), not controlled by medicine.  Shortness of breath.  Weakness after normal activity.  The white part of the eye turns yellow (jaundice).  You have a decrease in the amount of urine or are urinating less often.  Your urine turns a dark color or changes to pink, red, or brown. Document Released: 12/27/1999 Document Revised: 03/23/2011 Document Reviewed: 08/15/2007 [facility] Patient Information 2014 [place], [place].  _______________________________________________________________________

## 2019-11-13 NOTE — Progress Notes (Addendum)
COVID Vaccine Completed: x2 Date COVID Vaccine completed:  02-20-19 & 03-13-19 COVID vaccine manufacturer: Pfizer    Moderna   [employer]   PCP - Mayer Masker, PA-C (medical clearance on chart) Cardiologist -   Chest x-ray -  EKG -  Stress Test -  ECHO -  Cardiac Cath -  Pacemaker/ICD device last checked:  Sleep Study -  CPAP -   Fasting Blood Sugar -  Checks Blood Sugar _____ times a day  Blood Thinner Instructions: Aspirin Instructions: Last Dose:  Anesthesia review:   Patient denies shortness of breath, fever, cough and chest pain at PAT appointment   Patient verbalized understanding of instructions that were given to them at the PAT appointment. Patient was also instructed that they will need to review over the PAT instructions again at home before surgery.

## 2019-11-14 ENCOUNTER — Encounter (HOSPITAL_COMMUNITY)
Admission: RE | Admit: 2019-11-14 | Discharge: 2019-11-14 | Disposition: A | Discharge status: Home / Self Care | Payer: PPO | Source: Ambulatory Visit | Attending: Orthopedic Surgery | Admitting: Orthopedic Surgery

## 2019-11-14 ENCOUNTER — Encounter (HOSPITAL_COMMUNITY): Payer: Self-pay

## 2019-11-14 ENCOUNTER — Other Ambulatory Visit: Payer: Self-pay

## 2019-11-14 DIAGNOSIS — Z01812 Encounter for preprocedural laboratory examination: Secondary | ICD-10-CM | POA: Diagnosis not present

## 2019-11-14 HISTORY — DX: Personal history of other diseases of the digestive system: Z87.19

## 2019-11-14 HISTORY — DX: Anemia, unspecified: D64.9

## 2019-11-14 HISTORY — DX: Gastro-esophageal reflux disease without esophagitis: K21.9

## 2019-11-14 HISTORY — DX: Unspecified osteoarthritis, unspecified site: M19.90

## 2019-11-14 HISTORY — DX: Pneumonia, unspecified organism: J18.9

## 2019-11-14 HISTORY — DX: Depression, unspecified: F32.A

## 2019-11-14 HISTORY — DX: Other specified bacterial intestinal infections: A04.8

## 2019-11-14 LAB — COMPREHENSIVE METABOLIC PANEL
ALT: 34 U/L (ref 0–44)
AST: 23 U/L (ref 15–41)
Albumin: 4.3 g/dL (ref 3.5–5.0)
Alkaline Phosphatase: 69 U/L (ref 38–126)
Anion gap: 10 (ref 5–15)
BUN: 17 mg/dL (ref 8–23)
CO2: 28 mmol/L (ref 22–32)
Calcium: 9.6 mg/dL (ref 8.9–10.3)
Chloride: 98 mmol/L (ref 98–111)
Creatinine, Ser: 0.77 mg/dL (ref 0.44–1.00)
GFR, Estimated: >60 mL/min (ref >60)
Glucose, Bld: 95 mg/dL (ref 70–99)
Potassium: 4.4 mmol/L (ref 3.5–5.1)
Sodium: 136 mmol/L (ref 135–145)
Total Bilirubin: 0.7 mg/dL (ref 0.3–1.2)
Total Protein: 7.2 g/dL (ref 6.5–8.1)

## 2019-11-14 LAB — SURGICAL PCR SCREEN
MRSA, PCR: NEGATIVE
Staphylococcus aureus: NEGATIVE

## 2019-11-14 LAB — CBC
HCT: 43.3 % (ref 36.0–46.0)
Hemoglobin: 14.8 g/dL (ref 12.0–15.0)
MCH: 33.3 pg (ref 26.0–34.0)
MCHC: 34.2 g/dL (ref 30.0–36.0)
MCV: 97.3 fL (ref 80.0–100.0)
Platelets: 303 10*3/uL (ref 150–400)
RBC: 4.45 MIL/uL (ref 3.87–5.11)
RDW: 13.1 % (ref 11.5–15.5)
WBC: 6.8 10*3/uL (ref 4.0–10.5)
nRBC: 0 % (ref 0.0–0.2)

## 2019-11-14 LAB — PROTIME-INR
INR: 1 (ref 0.8–1.2)
Prothrombin Time: 12.9 seconds (ref 11.4–15.2)

## 2019-11-14 LAB — APTT: aPTT: 35 seconds (ref 24–36)

## 2019-11-14 NOTE — H&P (Signed)
TOTAL KNEE ADMISSION H&P  Patient is being admitted for right total knee arthroplasty.  Subjective:  Chief Complaint: Right knee pain.  HPI: Stacy Boyd, 64 y.o. female has a history of pain and functional disability in the right knee due to arthritis and has failed non-surgical conservative treatments for greater than 12 weeks to include corticosteriod injections, viscosupplementation injections and activity modification. Onset of symptoms was gradual, starting several years ago with gradually worsening course since that time. The patient noted no past surgery on the right knee.  Patient currently rates pain in the right knee at 7 out of 10 with activity. Patient has worsening of pain with activity and weight bearing, pain that interferes with activities of daily living and crepitus. Patient has evidence of severe patellofemoral arthritis bilaterally, bone-on-bone with some erosion of the patella by imaging studies. There is no active infection.  Patient Active Problem List   Diagnosis Date Noted  . Disorder of thyroid gland 10/11/2019  . Obesity 04/03/2019  . Bilateral knee pain 12/14/2017  . Leukocytosis 11/12/2017  . Helicobacter pylori gastritis 04/22/2017  . Synovial cyst of left popliteal space 03/12/2017  . NSAID long-term use 02/16/2017  . Tricompartment osteoarthritis of knee 02/16/2017  . Acquired iron deficiency anemia due to decreased absorption- due to rheum meds and tums etc 02/16/2017  . Rheumatoid arthritis (HCC) 11/24/2016  . Low testosterone level in female 11/24/2016  . Family history of colon cancer 08/31/2014  . Bilateral leg edema 08/29/2013  . Osteoarthritis, knee 08/29/2012  . Acquired hypothyroidism 05/07/2010  . Osteopenia 05/07/2010  . Rosacea 05/07/2010  . Sjogren's disease (HCC) 05/07/2010  . Menopausal hot flushes 02/11/2009    Past Medical History:  Diagnosis Date  . Anemia   . Arthritis   . Depression    Currently not being treated  . GERD  (gastroesophageal reflux disease)   . H. pylori infection   . History of hiatal hernia   . Hypothyroid   . Pneumonia     Past Surgical History:  Procedure Laterality Date  . ABDOMINAL HYSTERECTOMY    . CESAREAN SECTION    . COLONOSCOPY    . REFRACTIVE SURGERY      Prior to Admission medications   Medication Sig Start Date End Date Taking? Authorizing Provider  acetaminophen (TYLENOL) 650 MG CR tablet Take 1,300 mg by mouth in the morning and at bedtime.   Yes [provider]  Calcium-Phosphorus-Vitamin D (CITRACAL CALCIUM GUMMIES) 250-115-250 MG-MG-UNIT CHEW Chew 1 tablet by mouth daily.   Yes [provider]  Cholecalciferol (VITAMIN D-3) 125 MCG (5000 UT) TABS Take 5,000 Units by mouth daily.   Yes [provider]  famotidine-calcium carbonate-magnesium hydroxide (PEPCID COMPLETE) 10-800-165 MG chewable tablet Chew 1 tablet by mouth daily as needed (Hiatal Hernia). Chewable    Yes [provider]  Multiple Vitamins-Minerals (CENTRUM SILVER 50+WOMEN PO) Take 1 tablet daily by mouth.   Yes [provider]  Omega-3 Fatty Acids (FISH OIL) 1000 MG CPDR Take 1,000 mg by mouth daily.    Yes [provider]  Polyethyl Glycol-Propyl Glycol (SYSTANE) 0.4-0.3 % SOLN Place 1 drop into both eyes daily as needed (Dry eyes).    Yes [provider]  SPIRULINA PO Take 2,000 mg by mouth daily.    Yes [provider]  testosterone cypionate (DEPOTESTOTERONE CYPIONATE) 100 MG/ML injection Inject 100 mg into the muscle See admin instructions. 4 times a year   Yes [provider]  TURMERIC PO Take 1,000  mg by mouth daily.    Yes [provider]  UNABLE TO FIND Take 1 capsule by mouth daily. Thyroid Medication Compounded at [facility]  Med Name: liothyronine sodium (t#)/thyroxine-L (T4) SR 30-100 mcg   Yes [provider]  zolpidem (AMBIEN) 10 MG tablet Take 5 mg by mouth at bedtime as needed for  sleep.   Yes [provider]    Allergies  Allergen Reactions  . Erythromycin Hives and Nausea Only    Social History   Socioeconomic History  . Marital status: Divorced    Spouse name: Not on file  . Number of children: Not on file  . Years of education: Not on file  . Highest education level: Not on file  Occupational History  . Occupation: Dance movement psychotherapist. Horticulturist, commercial: Production assistant, radio FOR SELF EMPLOYED  Tobacco Use  . Smoking status: Never Smoker  . Smokeless tobacco: Never Used  Vaping Use  . Vaping Use: Never used  Substance and Sexual Activity  . Alcohol use: Yes    Alcohol/week: 1.0 standard drink    Types: 1 Standard drinks or equivalent per week    Comment: occ  . Drug use: No  . Sexual activity: Not Currently    Birth control/protection: None, Post-menopausal  Other Topics Concern  . Not on file  Social History Narrative  . Not on file   Social Determinants of Health   Financial Resource Strain:   . Difficulty of Paying Living Expenses: Not on file  Food Insecurity:   . Worried About Programme researcher, broadcasting/film/video in the Last Year: Not on file  . Ran Out of Food in the Last Year: Not on file  Transportation Needs:   . Lack of Transportation (Medical): Not on file  . Lack of Transportation (Non-Medical): Not on file  Physical Activity:   . Days of Exercise per Week: Not on file  . Minutes of Exercise per Session: Not on file  Stress:   . Feeling of Stress : Not on file  Social Connections:   . Frequency of Communication with Friends and Family: Not on file  . Frequency of Social Gatherings with Friends and Family: Not on file  . Attends Religious Services: Not on file  . Active Member of Clubs or Organizations: Not on file  . Attends Banker Meetings: Not on file  . Marital Status: Not on file  Intimate Partner Violence:   . Fear of Current or Ex-Partner: Not on file  . Emotionally Abused: Not on file  . Physically Abused: Not on  file  . Sexually Abused: Not on file      Tobacco Use: Low Risk   . Smoking Tobacco Use: Never Smoker  . Smokeless Tobacco Use: Never Used   Social History   Substance and Sexual Activity  Alcohol Use Yes  . Alcohol/week: 1.0 standard drink  . Types: 1 Standard drinks or equivalent per week   Comment: occ    Family History  Problem Relation Age of Onset  . Colon cancer Father   . Breast cancer Sister   . Prostate cancer Brother   . Stomach cancer Neg Hx   . Rectal cancer Neg Hx   . Pancreatic cancer Neg Hx     Review of Systems  Constitutional: Negative for chills and fever.  HENT: Negative for congestion, sore throat and tinnitus.   Eyes: Negative for double vision, photophobia and pain.  Respiratory: Negative for cough, shortness of  breath and wheezing.   Cardiovascular: Negative for chest pain, palpitations and orthopnea.  Gastrointestinal: Negative for heartburn, nausea and vomiting.  Genitourinary: Negative for dysuria, frequency and urgency.  Musculoskeletal: Positive for joint pain.  Neurological: Negative for dizziness, weakness and headaches.    Objective:  Physical Exam: Well nourished and well developed.  General: Alert and oriented x3, cooperative and pleasant, no acute distress.  Head: normocephalic, atraumatic, neck supple.  Eyes: EOMI.  Respiratory: breath sounds clear in all fields, no wheezing, rales, or rhonchi. Cardiovascular: Regular rate and rhythm, no murmurs, gallops or rubs.  Abdomen: non-tender to palpation and soft, normoactive bowel sounds. Musculoskeletal:  Right Knee Exam:  No effusion present. No swelling present.  The range of motion is: 0 to 130 degrees.  Marked crepitus on range of motion of the knee.  Positive medial joint line tenderness.  No lateral joint line tenderness.  The knee is stable.   Left Knee Exam: No tenderness to palpation of the medial or lateral joint line. No effusion or swelling. AROM 0-125. No  instability. Mild patellofemoral crepitus on range of motion.  Calves soft and nontender. Motor function intact in LE. Strength 5/5 LE bilaterally. Neuro: Distal pulses 2+. Sensation to light touch intact in LE.  Vital signs in last 24 hours: Temp:  [98.2 F (36.8 C)] 98.2 F (36.8 C) (11/02 1147) Pulse Rate:  [68] 68 (11/02 1147) Resp:  [16] 16 (11/02 1147) BP: (136)/(80) 136/80 (11/02 1147) SpO2:  [100 %] 100 % (11/02 1147) Weight:  [87.5 kg] 87.5 kg (11/02 1147)  Imaging Review Plain radiographs demonstrate severe degenerative joint disease of the right knee. The overall alignment is neutral. The bone quality appears to be adequate for age and reported activity level.  Assessment/Plan:  End stage arthritis, right knee   The patient history, physical examination, clinical judgment of the provider and imaging studies are consistent with end stage degenerative joint disease of the right knee and total knee arthroplasty is deemed medically necessary. The treatment options including medical management, injection therapy arthroscopy and arthroplasty were discussed at length. The risks and benefits of total knee arthroplasty were presented and reviewed. The risks due to aseptic loosening, infection, stiffness, patella tracking problems, thromboembolic complications and other imponderables were discussed. The patient acknowledged the explanation, agreed to proceed with the plan and consent was signed. Patient is being admitted for inpatient treatment for surgery, pain control, PT, OT, prophylactic antibiotics, VTE prophylaxis, progressive ambulation and ADLs and discharge planning. The patient is planning to be discharged home.  Patient's anticipated LOS is less than 2 midnights, meeting these requirements: - Lives within 1 hour of care - Has a competent adult at home to recover with post-op recover - NO history of  - Chronic pain requiring opioids  - Diabetes  - Coronary Artery Disease  -  Heart failure  - Heart attack  - Stroke  - DVT/VTE  - Cardiac arrhythmia  - Respiratory Failure/COPD  - Renal failure  - Anemia  - Advanced Liver disease  Therapy Plans: Outpatient therapy at [facility]) Disposition: Home with daughter Planned DVT Prophylaxis: Aspirin 325 mg BID DME Needed: Dan Humphreys PCP: Mayer Masker, PA-C (clearance received) TXA: IV Allergies: Erythromycin (hives) Anesthesia Concerns: None BMI: 29.9 Last HgbA1c: Not diabetic.  Pharmacy: [facility]   - Patient was instructed on what medications to stop prior to surgery. - Follow-up visit in 2 weeks with Dr. Lequita Halt - Begin physical therapy following surgery - Pre-operative lab work as  pre-surgical testing - Prescriptions will be provided in hospital at time of discharge  Theresa Duty, PA-C Orthopedic Surgery [facility] [place]

## 2019-11-16 ENCOUNTER — Other Ambulatory Visit (HOSPITAL_COMMUNITY)
Admission: RE | Admit: 2019-11-16 | Discharge: 2019-11-16 | Disposition: A | Discharge status: Home / Self Care | Payer: PPO | Source: Ambulatory Visit | Attending: Orthopedic Surgery | Admitting: Orthopedic Surgery

## 2019-11-16 DIAGNOSIS — Z20822 Contact with and (suspected) exposure to covid-19: Secondary | ICD-10-CM | POA: Diagnosis not present

## 2019-11-16 DIAGNOSIS — Z01818 Encounter for other preprocedural examination: Secondary | ICD-10-CM | POA: Insufficient documentation

## 2019-11-16 LAB — SARS CORONAVIRUS 2 (TAT 6-24 HRS): SARS Coronavirus 2: NEGATIVE

## 2019-11-19 MED ORDER — BUPIVACAINE LIPOSOME 1.3 % IJ SUSP
20.0000 mL | Freq: Once | INTRAMUSCULAR | Status: DC
  Filled 2019-11-19: qty 20

## 2019-11-20 ENCOUNTER — Ambulatory Visit (HOSPITAL_COMMUNITY)
Admission: RE | Admit: 2019-11-20 | Discharge: 2019-11-21 | Disposition: A | Discharge status: Home / Self Care | Payer: PPO | Attending: Orthopedic Surgery | Admitting: Orthopedic Surgery

## 2019-11-20 ENCOUNTER — Encounter (HOSPITAL_COMMUNITY): Payer: Self-pay | Admitting: Orthopedic Surgery

## 2019-11-20 ENCOUNTER — Ambulatory Visit (HOSPITAL_COMMUNITY): Payer: PPO | Admitting: Certified Registered"

## 2019-11-20 ENCOUNTER — Other Ambulatory Visit: Payer: Self-pay

## 2019-11-20 ENCOUNTER — Encounter (HOSPITAL_COMMUNITY)
Admission: RE | Disposition: A | Discharge status: Home / Self Care | Payer: Self-pay | Source: Home / Self Care | Attending: Orthopedic Surgery

## 2019-11-20 DIAGNOSIS — F32A Depression, unspecified: Secondary | ICD-10-CM | POA: Diagnosis not present

## 2019-11-20 DIAGNOSIS — Z8042 Family history of malignant neoplasm of prostate: Secondary | ICD-10-CM | POA: Diagnosis not present

## 2019-11-20 DIAGNOSIS — M1710 Unilateral primary osteoarthritis, unspecified knee: Secondary | ICD-10-CM | POA: Diagnosis present

## 2019-11-20 DIAGNOSIS — E669 Obesity, unspecified: Secondary | ICD-10-CM | POA: Insufficient documentation

## 2019-11-20 DIAGNOSIS — M069 Rheumatoid arthritis, unspecified: Secondary | ICD-10-CM | POA: Insufficient documentation

## 2019-11-20 DIAGNOSIS — Z881 Allergy status to other antibiotic agents status: Secondary | ICD-10-CM | POA: Diagnosis not present

## 2019-11-20 DIAGNOSIS — M1711 Unilateral primary osteoarthritis, right knee: Secondary | ICD-10-CM | POA: Diagnosis not present

## 2019-11-20 DIAGNOSIS — M3500 Sicca syndrome, unspecified: Secondary | ICD-10-CM | POA: Insufficient documentation

## 2019-11-20 DIAGNOSIS — Z803 Family history of malignant neoplasm of breast: Secondary | ICD-10-CM | POA: Insufficient documentation

## 2019-11-20 DIAGNOSIS — M1712 Unilateral primary osteoarthritis, left knee: Secondary | ICD-10-CM | POA: Diagnosis not present

## 2019-11-20 DIAGNOSIS — Z6831 Body mass index (BMI) 31.0-31.9, adult: Secondary | ICD-10-CM | POA: Insufficient documentation

## 2019-11-20 DIAGNOSIS — M8580 Other specified disorders of bone density and structure, unspecified site: Secondary | ICD-10-CM | POA: Diagnosis not present

## 2019-11-20 DIAGNOSIS — M179 Osteoarthritis of knee, unspecified: Secondary | ICD-10-CM

## 2019-11-20 DIAGNOSIS — Z800 Family history of malignant neoplasm of digestive organs: Secondary | ICD-10-CM | POA: Insufficient documentation

## 2019-11-20 DIAGNOSIS — D509 Iron deficiency anemia, unspecified: Secondary | ICD-10-CM | POA: Diagnosis not present

## 2019-11-20 DIAGNOSIS — Z79899 Other long term (current) drug therapy: Secondary | ICD-10-CM | POA: Insufficient documentation

## 2019-11-20 DIAGNOSIS — E039 Hypothyroidism, unspecified: Secondary | ICD-10-CM | POA: Diagnosis not present

## 2019-11-20 DIAGNOSIS — G8918 Other acute postprocedural pain: Secondary | ICD-10-CM | POA: Diagnosis not present

## 2019-11-20 HISTORY — PX: TOTAL KNEE ARTHROPLASTY: SHX125

## 2019-11-20 LAB — TYPE AND SCREEN
ABO/RH(D): B POS
Antibody Screen: NEGATIVE

## 2019-11-20 LAB — ABO/RH: ABO/RH(D): B POS

## 2019-11-20 SURGERY — ARTHROPLASTY, KNEE, TOTAL
Anesthesia: Spinal | Site: Knee | Laterality: Right

## 2019-11-20 MED ORDER — FENTANYL CITRATE (PF) 100 MCG/2ML IJ SOLN
50.0000 ug | INTRAMUSCULAR | Status: DC
  Filled 2019-11-20: qty 2

## 2019-11-20 MED ORDER — SODIUM CHLORIDE 0.9 % IV SOLN: INTRAVENOUS | Status: DC

## 2019-11-20 MED ORDER — BUPIVACAINE IN DEXTROSE 0.75-8.25 % IT SOLN
INTRATHECAL | Status: DC | PRN
  Administered 2019-11-20: 1.4 mL via INTRATHECAL

## 2019-11-20 MED ORDER — MIDAZOLAM HCL 2 MG/2ML IJ SOLN
INTRAMUSCULAR | Status: AC
  Filled 2019-11-20: qty 2

## 2019-11-20 MED ORDER — GABAPENTIN 300 MG PO CAPS
300.0000 mg | ORAL_CAPSULE | Freq: Three times a day (TID) | ORAL | Status: DC
  Administered 2019-11-20 – 2019-11-21 (×3): 300 mg via ORAL
  Filled 2019-11-20 (×3): qty 1

## 2019-11-20 MED ORDER — PHENYLEPHRINE HCL (PRESSORS) 10 MG/ML IV SOLN
INTRAVENOUS | Status: DC | PRN
  Administered 2019-11-20 (×2): 50 ug via INTRAVENOUS

## 2019-11-20 MED ORDER — CEFAZOLIN SODIUM-DEXTROSE 2-4 GM/100ML-% IV SOLN
2.0000 g | Freq: Four times a day (QID) | INTRAVENOUS | Status: AC
  Administered 2019-11-20 (×2): 2 g via INTRAVENOUS
  Filled 2019-11-20 (×2): qty 100

## 2019-11-20 MED ORDER — SODIUM CHLORIDE 0.9 % IR SOLN
Status: DC | PRN
  Administered 2019-11-20: 1000 mL

## 2019-11-20 MED ORDER — ASPIRIN EC 325 MG PO TBEC
325.0000 mg | DELAYED_RELEASE_TABLET | Freq: Two times a day (BID) | ORAL | Status: DC
  Administered 2019-11-21: 325 mg via ORAL
  Filled 2019-11-20: qty 1

## 2019-11-20 MED ORDER — MORPHINE SULFATE (PF) 2 MG/ML IV SOLN: 0.5000 mg | INTRAVENOUS | Status: DC | PRN

## 2019-11-20 MED ORDER — ORAL CARE MOUTH RINSE: 15.0000 mL | Freq: Once | OROMUCOSAL | Status: AC

## 2019-11-20 MED ORDER — PHENOL 1.4 % MT LIQD: 1.0000 | OROMUCOSAL | Status: DC | PRN

## 2019-11-20 MED ORDER — DIPHENHYDRAMINE HCL 12.5 MG/5ML PO ELIX: 12.5000 mg | ORAL_SOLUTION | ORAL | Status: DC | PRN

## 2019-11-20 MED ORDER — STERILE WATER FOR IRRIGATION IR SOLN
Status: DC | PRN
  Administered 2019-11-20: 1000 mL

## 2019-11-20 MED ORDER — CHLORHEXIDINE GLUCONATE 0.12 % MT SOLN
15.0000 mL | Freq: Once | OROMUCOSAL | Status: AC
  Administered 2019-11-20: 15 mL via OROMUCOSAL

## 2019-11-20 MED ORDER — ONDANSETRON HCL 4 MG/2ML IJ SOLN
INTRAMUSCULAR | Status: DC | PRN
  Administered 2019-11-20: 4 mg via INTRAVENOUS

## 2019-11-20 MED ORDER — MENTHOL 3 MG MT LOZG: 1.0000 | LOZENGE | OROMUCOSAL | Status: DC | PRN

## 2019-11-20 MED ORDER — CLONIDINE HCL (ANALGESIA) 100 MCG/ML EP SOLN
EPIDURAL | Status: DC | PRN
  Administered 2019-11-20: 100 ug

## 2019-11-20 MED ORDER — DEXMEDETOMIDINE (PRECEDEX) IN NS 20 MCG/5ML (4 MCG/ML) IV SYRINGE
PREFILLED_SYRINGE | INTRAVENOUS | Status: AC
  Filled 2019-11-20: qty 5

## 2019-11-20 MED ORDER — PHENYLEPHRINE HCL-NACL 10-0.9 MG/250ML-% IV SOLN
INTRAVENOUS | Status: DC | PRN
  Administered 2019-11-20: 10 ug/min via INTRAVENOUS

## 2019-11-20 MED ORDER — ROPIVACAINE HCL 5 MG/ML IJ SOLN
INTRAMUSCULAR | Status: DC | PRN
  Administered 2019-11-20 (×2): 5 mL via PERINEURAL

## 2019-11-20 MED ORDER — ONDANSETRON HCL 4 MG PO TABS: 4.0000 mg | ORAL_TABLET | Freq: Four times a day (QID) | ORAL | Status: DC | PRN

## 2019-11-20 MED ORDER — ACETAMINOPHEN 10 MG/ML IV SOLN
1000.0000 mg | Freq: Four times a day (QID) | INTRAVENOUS | Status: DC
  Administered 2019-11-20: 1000 mg via INTRAVENOUS
  Filled 2019-11-20: qty 100

## 2019-11-20 MED ORDER — BUPIVACAINE LIPOSOME 1.3 % IJ SUSP
INTRAMUSCULAR | Status: DC | PRN
  Administered 2019-11-20: 20 mL

## 2019-11-20 MED ORDER — METOCLOPRAMIDE HCL 5 MG/ML IJ SOLN: 5.0000 mg | Freq: Three times a day (TID) | INTRAMUSCULAR | Status: DC | PRN

## 2019-11-20 MED ORDER — DEXAMETHASONE SODIUM PHOSPHATE 10 MG/ML IJ SOLN
INTRAMUSCULAR | Status: AC
  Filled 2019-11-20: qty 1

## 2019-11-20 MED ORDER — FENTANYL CITRATE (PF) 100 MCG/2ML IJ SOLN
INTRAMUSCULAR | Status: AC
  Filled 2019-11-20: qty 2

## 2019-11-20 MED ORDER — DEXAMETHASONE SODIUM PHOSPHATE 10 MG/ML IJ SOLN
8.0000 mg | Freq: Once | INTRAMUSCULAR | Status: AC
  Administered 2019-11-20: 8 mg via INTRAVENOUS

## 2019-11-20 MED ORDER — OXYCODONE HCL 5 MG PO TABS: 5.0000 mg | ORAL_TABLET | ORAL | Status: DC | PRN

## 2019-11-20 MED ORDER — GLYCOPYRROLATE PF 0.2 MG/ML IJ SOSY
PREFILLED_SYRINGE | INTRAMUSCULAR | Status: AC
  Filled 2019-11-20: qty 1

## 2019-11-20 MED ORDER — POVIDONE-IODINE 10 % EX SWAB
2.0000 "application " | Freq: Once | CUTANEOUS | Status: AC
  Administered 2019-11-20: 2 via TOPICAL

## 2019-11-20 MED ORDER — METOCLOPRAMIDE HCL 5 MG PO TABS: 5.0000 mg | ORAL_TABLET | Freq: Three times a day (TID) | ORAL | Status: DC | PRN

## 2019-11-20 MED ORDER — PROPOFOL 500 MG/50ML IV EMUL
INTRAVENOUS | Status: DC | PRN
  Administered 2019-11-20: 40 ug/kg/min via INTRAVENOUS

## 2019-11-20 MED ORDER — ACETAMINOPHEN 325 MG PO TABS: 325.0000 mg | ORAL_TABLET | Freq: Four times a day (QID) | ORAL | Status: DC | PRN

## 2019-11-20 MED ORDER — PROPOFOL 1000 MG/100ML IV EMUL
INTRAVENOUS | Status: AC
  Filled 2019-11-20: qty 100

## 2019-11-20 MED ORDER — ONDANSETRON HCL 4 MG/2ML IJ SOLN
INTRAMUSCULAR | Status: AC
  Filled 2019-11-20: qty 2

## 2019-11-20 MED ORDER — POLYETHYLENE GLYCOL 3350 17 G PO PACK: 17.0000 g | PACK | Freq: Every day | ORAL | Status: DC | PRN

## 2019-11-20 MED ORDER — FAMOTIDINE 10 MG PO TABS
10.0000 mg | ORAL_TABLET | Freq: Every day | ORAL | Status: DC | PRN
  Filled 2019-11-20: qty 1

## 2019-11-20 MED ORDER — SODIUM CHLORIDE (PF) 0.9 % IJ SOLN
INTRAMUSCULAR | Status: AC
  Filled 2019-11-20: qty 50

## 2019-11-20 MED ORDER — DOCUSATE SODIUM 100 MG PO CAPS
100.0000 mg | ORAL_CAPSULE | Freq: Two times a day (BID) | ORAL | Status: DC
  Administered 2019-11-20 – 2019-11-21 (×3): 100 mg via ORAL
  Filled 2019-11-20 (×3): qty 1

## 2019-11-20 MED ORDER — DEXAMETHASONE SODIUM PHOSPHATE 10 MG/ML IJ SOLN
10.0000 mg | Freq: Once | INTRAMUSCULAR | Status: AC
  Administered 2019-11-21: 10 mg via INTRAVENOUS
  Filled 2019-11-20: qty 1

## 2019-11-20 MED ORDER — MIDAZOLAM HCL 2 MG/2ML IJ SOLN
1.0000 mg | INTRAMUSCULAR | Status: DC
  Administered 2019-11-20: 2 mg via INTRAVENOUS
  Filled 2019-11-20: qty 2

## 2019-11-20 MED ORDER — ROPIVACAINE HCL 7.5 MG/ML IJ SOLN
INTRAMUSCULAR | Status: DC | PRN
  Administered 2019-11-20 (×4): 5 mL via PERINEURAL

## 2019-11-20 MED ORDER — FLEET ENEMA 7-19 GM/118ML RE ENEM: 1.0000 | ENEMA | Freq: Once | RECTAL | Status: DC | PRN

## 2019-11-20 MED ORDER — TRANEXAMIC ACID-NACL 1000-0.7 MG/100ML-% IV SOLN
1000.0000 mg | INTRAVENOUS | Status: AC
  Administered 2019-11-20: 1000 mg via INTRAVENOUS
  Filled 2019-11-20: qty 100

## 2019-11-20 MED ORDER — METHOCARBAMOL 500 MG PO TABS
500.0000 mg | ORAL_TABLET | Freq: Four times a day (QID) | ORAL | Status: DC | PRN
  Administered 2019-11-20 – 2019-11-21 (×3): 500 mg via ORAL
  Filled 2019-11-20 (×3): qty 1

## 2019-11-20 MED ORDER — METHOCARBAMOL 1000 MG/10ML IJ SOLN
500.0000 mg | Freq: Four times a day (QID) | INTRAVENOUS | Status: DC | PRN
  Filled 2019-11-20: qty 5

## 2019-11-20 MED ORDER — PHENYLEPHRINE HCL (PRESSORS) 10 MG/ML IV SOLN
INTRAVENOUS | Status: AC
  Filled 2019-11-20: qty 1

## 2019-11-20 MED ORDER — BISACODYL 10 MG RE SUPP: 10.0000 mg | Freq: Every day | RECTAL | Status: DC | PRN

## 2019-11-20 MED ORDER — LACTATED RINGERS IV SOLN: INTRAVENOUS | Status: DC

## 2019-11-20 MED ORDER — SODIUM CHLORIDE (PF) 0.9 % IJ SOLN
INTRAMUSCULAR | Status: AC
  Filled 2019-11-20: qty 10

## 2019-11-20 MED ORDER — LIDOCAINE 2% (20 MG/ML) 5 ML SYRINGE
INTRAMUSCULAR | Status: AC
  Filled 2019-11-20: qty 5

## 2019-11-20 MED ORDER — TRAMADOL HCL 50 MG PO TABS
50.0000 mg | ORAL_TABLET | Freq: Four times a day (QID) | ORAL | Status: DC | PRN
  Administered 2019-11-20: 50 mg via ORAL
  Administered 2019-11-21: 100 mg via ORAL
  Administered 2019-11-21: 50 mg via ORAL
  Filled 2019-11-20: qty 2
  Filled 2019-11-20 (×2): qty 1

## 2019-11-20 MED ORDER — SODIUM CHLORIDE (PF) 0.9 % IJ SOLN
INTRAMUSCULAR | Status: DC | PRN
  Administered 2019-11-20: 60 mL

## 2019-11-20 MED ORDER — ONDANSETRON HCL 4 MG/2ML IJ SOLN: 4.0000 mg | Freq: Four times a day (QID) | INTRAMUSCULAR | Status: DC | PRN

## 2019-11-20 MED ORDER — CEFAZOLIN SODIUM-DEXTROSE 2-4 GM/100ML-% IV SOLN
2.0000 g | INTRAVENOUS | Status: AC
  Administered 2019-11-20: 2 g via INTRAVENOUS
  Filled 2019-11-20: qty 100

## 2019-11-20 MED ORDER — 0.9 % SODIUM CHLORIDE (POUR BTL) OPTIME
TOPICAL | Status: DC | PRN
  Administered 2019-11-20: 1000 mL

## 2019-11-20 MED ORDER — PROPOFOL 10 MG/ML IV BOLUS
INTRAVENOUS | Status: DC | PRN
  Administered 2019-11-20 (×4): 20 mg via INTRAVENOUS

## 2019-11-20 MED ORDER — DEXMEDETOMIDINE (PRECEDEX) IN NS 20 MCG/5ML (4 MCG/ML) IV SYRINGE
PREFILLED_SYRINGE | INTRAVENOUS | Status: DC | PRN
  Administered 2019-11-20: 4 ug via INTRAVENOUS

## 2019-11-20 SURGICAL SUPPLY — 55 items
ATTUNE PSFEM RTSZ6 NARCEM KNEE (Femur) ×2 IMPLANT
ATTUNE PSRP INSR SZ5 8 KNEE (Insert) ×2 IMPLANT
BAG ZIPLOCK 12X15 (MISCELLANEOUS) ×2 IMPLANT
BASE TIBIAL ROT PLAT SZ 5 KNEE (Knees) ×1 IMPLANT
BLADE SAG 18X100X1.27 (BLADE) ×2 IMPLANT
BLADE SAW SGTL 11.0X1.19X90.0M (BLADE) ×2 IMPLANT
BLADE SURG SZ10 CARB STEEL (BLADE) ×4 IMPLANT
BNDG ELASTIC 6X10 VLCR STRL LF (GAUZE/BANDAGES/DRESSINGS) ×2 IMPLANT
BNDG ELASTIC 6X5.8 VLCR STR LF (GAUZE/BANDAGES/DRESSINGS) ×2 IMPLANT
BOWL SMART MIX CTS (DISPOSABLE) ×2 IMPLANT
CEMENT HV SMART SET (Cement) ×4 IMPLANT
CLSR STERI-STRIP ANTIMIC 1/2X4 (GAUZE/BANDAGES/DRESSINGS) ×2 IMPLANT
COVER SURGICAL LIGHT HANDLE (MISCELLANEOUS) ×2 IMPLANT
COVER WAND RF STERILE (DRAPES) IMPLANT
CUFF TOURN SGL QUICK 34 (TOURNIQUET CUFF) ×2
CUFF TRNQT CYL 34X4.125X (TOURNIQUET CUFF) ×1 IMPLANT
DECANTER SPIKE VIAL GLASS SM (MISCELLANEOUS) ×2 IMPLANT
DRAPE U-SHAPE 47X51 STRL (DRAPES) ×2 IMPLANT
DRSG AQUACEL AG ADV 3.5X10 (GAUZE/BANDAGES/DRESSINGS) ×2 IMPLANT
DURAPREP 26ML APPLICATOR (WOUND CARE) ×2 IMPLANT
ELECT REM PT RETURN 15FT ADLT (MISCELLANEOUS) ×2 IMPLANT
GLOVE BIO SURGEON STRL SZ7 (GLOVE) ×2 IMPLANT
GLOVE BIO SURGEON STRL SZ8 (GLOVE) ×2 IMPLANT
GLOVE BIOGEL PI IND STRL 7.0 (GLOVE) ×1 IMPLANT
GLOVE BIOGEL PI IND STRL 8 (GLOVE) ×1 IMPLANT
GLOVE BIOGEL PI INDICATOR 7.0 (GLOVE) ×1
GLOVE BIOGEL PI INDICATOR 8 (GLOVE) ×1
GOWN STRL REUS W/TWL LRG LVL3 (GOWN DISPOSABLE) ×4 IMPLANT
HANDPIECE INTERPULSE COAX TIP (DISPOSABLE) ×2
HOLDER FOLEY CATH W/STRAP (MISCELLANEOUS) IMPLANT
IMMOBILIZER KNEE 20 (SOFTGOODS) ×2
IMMOBILIZER KNEE 20 THIGH 36 (SOFTGOODS) ×1 IMPLANT
KIT TURNOVER KIT A (KITS) IMPLANT
MANIFOLD NEPTUNE II (INSTRUMENTS) ×2 IMPLANT
NS IRRIG 1000ML POUR BTL (IV SOLUTION) ×2 IMPLANT
PACK TOTAL KNEE CUSTOM (KITS) ×2 IMPLANT
PADDING CAST ABS 6INX4YD NS (CAST SUPPLIES) ×1
PADDING CAST ABS COTTON 6X4 NS (CAST SUPPLIES) ×1 IMPLANT
PADDING CAST COTTON 6X4 STRL (CAST SUPPLIES) ×4 IMPLANT
PATELLA MEDIAL ATTUN 35MM KNEE (Knees) ×2 IMPLANT
PENCIL SMOKE EVACUATOR (MISCELLANEOUS) ×2 IMPLANT
PIN FIX SIGMA LCS THRD HI (PIN) ×2 IMPLANT
PIN STEINMAN FIXATION KNEE (PIN) ×2 IMPLANT
PROTECTOR NERVE ULNAR (MISCELLANEOUS) ×2 IMPLANT
SET HNDPC FAN SPRY TIP SCT (DISPOSABLE) ×1 IMPLANT
STRIP CLOSURE SKIN 1/2X4 (GAUZE/BANDAGES/DRESSINGS) ×4 IMPLANT
SUT MNCRL AB 4-0 PS2 18 (SUTURE) ×2 IMPLANT
SUT STRATAFIX 0 PDS 27 VIOLET (SUTURE) ×2
SUT VIC AB 2-0 CT1 27 (SUTURE) ×6
SUT VIC AB 2-0 CT1 TAPERPNT 27 (SUTURE) ×3 IMPLANT
SUTURE STRATFX 0 PDS 27 VIOLET (SUTURE) ×1 IMPLANT
TIBIAL BASE ROT PLAT SZ 5 KNEE (Knees) ×2 IMPLANT
TRAY FOLEY MTR SLVR 16FR STAT (SET/KITS/TRAYS/PACK) ×2 IMPLANT
WATER STERILE IRR 1000ML POUR (IV SOLUTION) ×4 IMPLANT
WRAP KNEE MAXI GEL POST OP (GAUZE/BANDAGES/DRESSINGS) ×2 IMPLANT

## 2019-11-20 NOTE — Anesthesia Procedure Notes (Signed)
Spinal  Patient location during procedure: OR Start time: 11/20/2019 8:32 AM End time: 11/20/2019 8:35 AM Staffing Performed: anesthesiologist  Anesthesiologist: Leilani Able, MD Preanesthetic Checklist Completed: patient identified, IV checked, site marked, risks and benefits discussed, surgical consent, monitors and equipment checked, pre-op evaluation and timeout performed Spinal Block Patient position: sitting Prep: DuraPrep and site prepped and draped Patient monitoring: continuous pulse ox and blood pressure Approach: midline Location: L3-4 Injection technique: single-shot Needle Needle type: Pencan  Needle gauge: 24 G Needle length: 10 cm Needle insertion depth: 5 cm Assessment Sensory level: T8

## 2019-11-20 NOTE — Anesthesia Preprocedure Evaluation (Signed)
Anesthesia Evaluation  Patient identified by MRN, date of birth, ID band Patient awake    Reviewed: Allergy & Precautions, NPO status , Patient's Chart, lab work & pertinent test results  Airway Mallampati: I       Dental no notable dental hx.    Pulmonary    Pulmonary exam normal        Cardiovascular Normal cardiovascular exam     Neuro/Psych Depression    GI/Hepatic Neg liver ROS, GERD  Medicated,  Endo/Other    Renal/GU negative Renal ROS     Musculoskeletal   Abdominal Normal abdominal exam  (+)   Peds  Hematology   Anesthesia Other Findings   Reproductive/Obstetrics                             Anesthesia Physical Anesthesia Plan  ASA: II  Anesthesia Plan: Spinal   Post-op Pain Management:  Regional for Post-op pain   Induction:   PONV Risk Score and Plan: 2 and Ondansetron and Dexamethasone  Airway Management Planned: Natural Airway and Simple Face Mask  Additional Equipment: None  Intra-op Plan:   Post-operative Plan:   Informed Consent: I have reviewed the patients History and Physical, chart, labs and discussed the procedure including the risks, benefits and alternatives for the proposed anesthesia with the patient or authorized representative who has indicated his/her understanding and acceptance.       Plan Discussed with: CRNA  Anesthesia Plan Comments:         Anesthesia Quick Evaluation

## 2019-11-20 NOTE — Op Note (Signed)
OPERATIVE REPORT-TOTAL KNEE ARTHROPLASTY   Pre-operative diagnosis- Osteoarthritis  Right knee(s)  Post-operative diagnosis- Osteoarthritis Right knee(s)  Procedure-  Right  Total Knee Arthroplasty  Surgeon- Gus Rankin. Starling Jessie, MD  Assistant- Leilani Able, PA-C   Anesthesia-  Adductor canal block and spinal  EBL- 25 ml   Drains None  Tourniquet time-  Total Tourniquet Time Documented: Thigh (Right) - 29 minutes Total: Thigh (Right) - 29 minutes     Complications- None  Condition-PACU - hemodynamically stable.   Brief Clinical Note  Stacy Boyd is a 64 y.o. year old female with end stage OA of her right knee with progressively worsening pain and dysfunction. She has constant pain, with activity and at rest and significant functional deficits with difficulties even with ADLs. She has had extensive non-op management including analgesics, injections of cortisone and viscosupplements, and home exercise program, but remains in significant pain with significant dysfunction.Radiographs show bone on bone arthritis patellofemoral. She presents now for right Total Knee Arthroplasty.    Procedure in detail---   The patient is brought into the operating room and positioned supine on the operating table. After successful administration of  Adductor canal block and spinal,   a tourniquet is placed high on the  Right thigh(s) and the lower extremity is prepped and draped in the usual sterile fashion. Time out is performed by the operating team and then the  Right lower extremity is wrapped in Esmarch, knee flexed and the tourniquet inflated to 300 mmHg.       A midline incision is made with a ten blade through the subcutaneous tissue to the level of the extensor mechanism. A fresh blade is used to make a medial parapatellar arthrotomy. Soft tissue over the proximal medial tibia is subperiosteally elevated to the joint line with a knife and into the semimembranosus bursa with a Cobb elevator.  Soft tissue over the proximal lateral tibia is elevated with attention being paid to avoiding the patellar tendon on the tibial tubercle. The patella is everted, knee flexed 90 degrees and the ACL and PCL are removed. Findings are  bone on bone arthritis patellofemoral with significant synovitis       The drill is used to create a starting hole in the distal femur and the canal is thoroughly irrigated with sterile saline to remove the fatty contents. The 5 degree Right  valgus alignment guide is placed into the femoral canal and the distal femoral cutting block is pinned to remove 9 mm off the distal femur. Resection is made with an oscillating saw.      The tibia is subluxed forward and the menisci are removed. The extramedullary alignment guide is placed referencing proximally at the medial aspect of the tibial tubercle and distally along the second metatarsal axis and tibial crest. The block is pinned to remove 76mm off the more deficient medial  side. Resection is made with an oscillating saw. Size 5is the most appropriate size for the tibia and the proximal tibia is prepared with the modular drill and keel punch for that size.      The femoral sizing guide is placed and size 6 is most appropriate. Rotation is marked off the epicondylar axis and confirmed by creating a rectangular flexion gap at 90 degrees. The size 6 cutting block is pinned in this rotation and the anterior, posterior and chamfer cuts are made with the oscillating saw. The intercondylar block is then placed and that cut is made.      Trial  size 5 tibial component, trial size 6 narrow posterior stabilized femur and a 8  mm posterior stabilized rotating platform insert trial is placed. Full extension is achieved with excellent varus/valgus and anterior/posterior balance throughout full range of motion. The patella is everted and thickness measured to be 22  mm. Free hand resection is taken to 12 mm, a 35 template is placed, lug holes are  drilled, trial patella is placed, and it tracks normally. Osteophytes are removed off the posterior femur with the trial in place. All trials are removed and the cut bone surfaces prepared with pulsatile lavage. Cement is mixed and once ready for implantation, the size 5 tibial implant, size  6 narrow posterior stabilized femoral component, and the size 35 patella are cemented in place and the patella is held with the clamp. The trial insert is placed and the knee held in full extension. The Exparel (20 ml mixed with 60 ml saline) is injected into the extensor mechanism, posterior capsule, medial and lateral gutters and subcutaneous tissues.  All extruded cement is removed and once the cement is hard the permanent 8 mm posterior stabilized rotating platform insert is placed into the tibial tray.      The wound is copiously irrigated with saline solution and the extensor mechanism closed with # 0 Stratofix suture. The tourniquet is released for a total tourniquet time of 29  minutes. Flexion against gravity is 140 degrees and the patella tracks normally. Subcutaneous tissue is closed with 2.0 vicryl and subcuticular with running 4.0 Monocryl. The incision is cleaned and dried and steri-strips and a bulky sterile dressing are applied. The limb is placed into a knee immobilizer and the patient is awakened and transported to recovery in stable condition.      Please note that a surgical assistant was a medical necessity for this procedure in order to perform it in a safe and expeditious manner. Surgical assistant was necessary to retract the ligaments and vital neurovascular structures to prevent injury to them and also necessary for proper positioning of the limb to allow for anatomic placement of the prosthesis.   Gus Rankin Aaniyah Strohm, MD    11/20/2019, 9:30 AM

## 2019-11-20 NOTE — Anesthesia Procedure Notes (Signed)
Anesthesia Regional Block: Adductor canal block   Pre-Anesthetic Checklist: ,, timeout performed, Correct Patient, Correct Site, Correct Laterality, Correct Procedure, Correct Position, site marked, Risks and benefits discussed,  Surgical consent,  Pre-op evaluation,  At surgeon's request and post-op pain management  Laterality: Lower and Right  Prep: chloraprep       Needles:  Injection technique: Single-shot  Needle Type: Echogenic Stimulator Needle     Needle Length: 9cm  Needle Gauge: 20   Needle insertion depth: 2.5 cm   Additional Needles:   Procedures:,,,, ultrasound used (permanent image in chart),,,,  Narrative:  Start time: 11/20/2019 7:50 AM End time: 11/20/2019 8:00 AM Injection made incrementally with aspirations every 5 mL.  Performed by: Personally  Anesthesiologist: Leilani Able, MD

## 2019-11-20 NOTE — Progress Notes (Signed)
Orthopedic Tech Progress Note Patient Details:  Stacy Boyd 14-May-1955 [id number]  CPM Right Knee CPM Right Knee: On Right Knee Flexion (Degrees): 40 Right Knee Extension (Degrees): 10     Saul Fordyce 11/20/2019, 10:11 AM

## 2019-11-20 NOTE — Addendum Note (Signed)
Addendum  created 11/20/19 1106 by Wilder Glade, CRNA   Clinical Note Signed, Intraprocedure Event edited, Intraprocedure Staff edited

## 2019-11-20 NOTE — Plan of Care (Signed)

## 2019-11-20 NOTE — Transfer of Care (Addendum)
Immediate Anesthesia Transfer of Care Note  Patient: Stacy Boyd  Procedure(s) Performed: TOTAL KNEE ARTHROPLASTY (Right Knee)  Patient Location: PACU  Anesthesia Type:MAC and Spinal  Level of Consciousness: awake, alert , oriented and patient cooperative  Airway & Oxygen Therapy: Patient Spontanous Breathing  Post-op Assessment: Report given to RN and Post -op Vital signs reviewed and stable  Post vital signs: Reviewed and stable  Last Vitals:  Vitals Value Taken Time  BP 116/62 11/20/19 1045  Temp 36.4 C 11/20/19 0954  Pulse 54 11/20/19 1046  Resp 12 11/20/19 1045  SpO2 100 % 11/20/19 1046  Vitals shown include unvalidated device data.  Last Pain:  Vitals:   11/20/19 1030  TempSrc:   PainSc: 0-No pain      Patients Stated Pain Goal: 3 (18/29/93 7169)  Complications: No complications documented.

## 2019-11-20 NOTE — Progress Notes (Signed)
Assisted Dr. Hatchett with right, ultrasound guided, adductor canal block. Side rails up, monitors on throughout procedure. See vital signs in flow sheet. Tolerated Procedure well.  

## 2019-11-20 NOTE — Interval H&P Note (Signed)
History and Physical Interval Note:  11/20/2019 6:31 AM  Stacy Boyd  has presented today for surgery, with the diagnosis of right knee osteoarthritis.  The various methods of treatment have been discussed with the patient and family. After consideration of risks, benefits and other options for treatment, the patient has consented to  Procedure(s) with comments: TOTAL KNEE ARTHROPLASTY (Right) - as a surgical intervention.  The patient's history has been reviewed, patient examined, no change in status, stable for surgery.  I have reviewed the patient's chart and labs.  Questions were answered to the patient's satisfaction.     Homero Fellers Dulse Rutan

## 2019-11-20 NOTE — Progress Notes (Signed)
Orthopedic Tech Progress Note Patient Details:  Stacy Boyd [birth year] [id number]  CPM Right Knee CPM Right Knee: Off Right Knee Flexion (Degrees): 40 Right Knee Extension (Degrees): 10     Saul Fordyce 11/20/2019, 2:02 PM

## 2019-11-20 NOTE — Evaluation (Signed)
Physical Therapy Evaluation Patient Details Name: Stacy Boyd MRN: [id number] DOB: 1955/05/16 Today's Date: 11/20/2019   History of Present Illness  s/p R TKA. PMH: RA, depression, Sjogrens dz.  Clinical Impression  Pt is s/p TKA resulting in the deficits listed below (see PT Problem List).  Pt amb 60' with Rw and min/guard assist. anticipate steady progress in acute setting.  Pt will benefit from skilled PT to increase their independence and safety with mobility to allow discharge to the venue listed below.      Follow Up Recommendations Follow surgeon's recommendation for DC plan and follow-up therapies    Equipment Recommendations  Rolling walker with 5" wheels    Recommendations for Other Services       Precautions / Restrictions Precautions Precautions: Knee;Fall Required Braces or Orthoses: Knee Immobilizer - Right Knee Immobilizer - Right: Discontinue once straight leg raise with < 10 degree lag Restrictions Weight Bearing Restrictions: No Other Position/Activity Restrictions: WBAT      Mobility  Bed Mobility Overal bed mobility: Needs Assistance Bed Mobility: Supine to Sit     Supine to sit: Supervision     General bed mobility comments: for safety    Transfers Overall transfer level: Needs assistance Equipment used: Rolling walker (2 wheeled) Transfers: Sit to/from Stand Sit to Stand: Min guard         General transfer comment: cues for hand placement  Ambulation/Gait Ambulation/Gait assistance: Min guard;Min assist Gait Distance (Feet): 60 Feet Assistive device: Rolling walker (2 wheeled) Gait Pattern/deviations: Step-to pattern     General Gait Details: cues for sequence  Stairs            Wheelchair Mobility    Modified Rankin (Stroke Patients Only)       Balance                                             Pertinent Vitals/Pain Pain Assessment: 0-10 Pain Score: 3  Pain Location: right knee Pain  Descriptors / Indicators: Discomfort Pain Intervention(s): Limited activity within patient's tolerance;Monitored during session;Premedicated before session    Home Living Family/patient expects to be discharged to:: Private residence Living Arrangements: Non-relatives/Friends;Other (Comment) Available Help at Discharge: Family;Available 24 hours/day Type of Home: House Home Access: Stairs to enter   [employer] of Steps: 2 Home Layout: One level Home Equipment: None      Prior Function Level of Independence: Independent               Hand Dominance        Extremity/Trunk Assessment   Upper Extremity Assessment Upper Extremity Assessment: Overall WFL for tasks assessed    Lower Extremity Assessment Lower Extremity Assessment: RLE deficits/detail RLE Deficits / Details: ankle WFL, knee extension and hip flexion grossly 3/5. knee flexion ~ 8 to 65 degrees flexion       Communication   Communication: No difficulties  Cognition Arousal/Alertness: Awake/alert Behavior During Therapy: WFL for tasks assessed/performed Overall Cognitive Status: Within Functional Limits for tasks assessed                                        General Comments      Exercises Total Joint Exercises Ankle Circles/Pumps: AROM;Both;10 reps Quad Sets: AROM;Both;10 reps   Assessment/Plan  PT Assessment Patient needs continued PT services  PT Problem List Decreased strength;Decreased range of motion;Decreased activity tolerance;Decreased knowledge of use of DME;Pain;Decreased mobility       PT Treatment Interventions DME instruction;Therapeutic exercise;Gait training;Functional mobility training;Stair training;Therapeutic activities;Patient/family education    PT Goals (Current goals can be found in the Care Plan section)  Acute Rehab PT Goals Patient Stated Goal: home tomorrow PT Goal Formulation: With patient Time For Goal Achievement:  11/27/19 Potential to Achieve Goals: Good    Frequency 7X/week   Barriers to discharge        Co-evaluation               AM-PAC PT "6 Clicks" Mobility  Outcome Measure Help needed turning from your back to your side while in a flat bed without using bedrails?: A Little Help needed moving from lying on your back to sitting on the side of a flat bed without using bedrails?: A Little Help needed moving to and from a bed to a chair (including a wheelchair)?: A Little Help needed standing up from a chair using your arms (e.g., wheelchair or bedside chair)?: A Little Help needed to walk in hospital room?: A Little Help needed climbing 3-5 steps with a railing? : A Little 6 Click Score: 18    End of Session Equipment Utilized During Treatment: Gait belt Activity Tolerance: Patient tolerated treatment well Patient left: with call bell/phone within reach;in chair;with chair alarm set;with family/visitor present   PT Visit Diagnosis: Difficulty in walking, not elsewhere classified (R26.2)    Time: 9379-0240 PT Time Calculation (min) (ACUTE ONLY): 24 min   Charges:   PT Evaluation $PT Eval Low Complexity: 1 Low PT Treatments $Gait Training: 8-22 mins        Delice Bison, PT  Acute Rehab Dept ([facility]) [phone] Pager [phone]  11/20/2019   [facility] 11/20/2019, 4:36 PM

## 2019-11-20 NOTE — Progress Notes (Signed)
Orthopedic Tech Progress Note Patient Details:  Stacy Boyd [birth year] [id number]  Ortho Devices Type of Ortho Device: CPM padding       Saul Fordyce 11/20/2019, 10:13 AM

## 2019-11-20 NOTE — Anesthesia Postprocedure Evaluation (Signed)
Anesthesia Post Note  Patient: Stacy Boyd  Procedure(s) Performed: TOTAL KNEE ARTHROPLASTY (Right Knee)     Patient location during evaluation: PACU Anesthesia Type: Spinal Level of consciousness: awake Pain management: pain level controlled Vital Signs Assessment: post-procedure vital signs reviewed and stable Respiratory status: spontaneous breathing Cardiovascular status: stable Postop Assessment: no headache, no backache, spinal receding, patient able to bend at knees and no apparent nausea or vomiting Anesthetic complications: no   No complications documented.  Last Vitals:  Vitals:   11/20/19 1015 11/20/19 1030  BP: (!) 101/55 (!) 110/59  Pulse: (!) 58 (!) 53  Resp: 12 15  Temp:    SpO2: 100% 100%    Last Pain:  Vitals:   11/20/19 1030  TempSrc:   PainSc: 0-No pain                 John F Scharlene Corn

## 2019-11-20 NOTE — Discharge Instructions (Signed)
 Stacy Aluisio, MD Total Joint Specialist [facility] [place] [address] ,  [address] [phone]  TOTAL KNEE REPLACEMENT POSTOPERATIVE DIRECTIONS    Knee Rehabilitation, Guidelines Following Surgery  Results after knee surgery are often greatly improved when you follow the exercise, range of motion and muscle strengthening exercises prescribed by your doctor. Safety measures are also important to protect the knee from further injury. If any of these exercises cause you to have increased pain or swelling in your knee joint, decrease the amount until you are comfortable again and slowly increase them. If you have problems or questions, call your caregiver or physical therapist for advice.   BLOOD CLOT PREVENTION . Take a 325 mg Aspirin two times a day for three weeks following surgery. Then take an 81 mg Aspirin once a day for three weeks. Then discontinue Aspirin. . You may resume your vitamins/supplements upon discharge from the hospital. . Do not take any NSAIDs (Advil, Aleve, Ibuprofen, Meloxicam, etc.) until you have discontinued the 325 mg Aspirin.  HOME CARE INSTRUCTIONS  . Remove items at home which could result in a fall. This includes throw rugs or furniture in walking pathways.  . ICE to the affected knee as much as tolerated. Icing helps control swelling. If the swelling is well controlled you will be more comfortable and rehab easier. Continue to use ice on the knee for pain and swelling from surgery. You may notice swelling that will progress down to the foot and ankle. This is normal after surgery. Elevate the leg when you are not up walking on it.    . Continue to use the breathing machine which will help keep your temperature down. It is common for your temperature to cycle up and down following surgery, especially at night when you are not up moving around and exerting yourself. The breathing machine keeps your lungs expanded and your  temperature down. . Do not place pillow under the operative knee, focus on keeping the knee straight while resting  DIET You may resume your previous home diet once you are discharged from the hospital.  DRESSING / WOUND CARE / SHOWERING . Keep your bulky bandage on for 2 days. On the third post-operative day you may remove the Ace bandage and gauze. There is a waterproof adhesive bandage on your skin which will stay in place until your first follow-up appointment. Once you remove this you will not need to place another bandage . You may begin showering 3 days following surgery, but do not submerge the incision under water.  ACTIVITY For the first 5 days, the key is rest and control of pain and swelling . Do your home exercises twice a day starting on post-operative day 3. On the days you go to physical therapy, just do the home exercises once that day. . You should rest, ice and elevate the leg for 50 minutes out of every hour. Get up and walk/stretch for 10 minutes per hour. After 5 days you can increase your activity slowly as tolerated. . Walk with your walker as instructed. Use the walker until you are comfortable transitioning to a cane. Walk with the cane in the opposite hand of the operative leg. You may discontinue the cane once you are comfortable and walking steadily. . Avoid periods of inactivity such as sitting longer than an hour when not asleep. This helps prevent blood clots.  . You may discontinue the knee immobilizer once you are able to perform a straight   leg raise while lying down. . You may resume a sexual relationship in one month or when given the OK by your doctor.  . You may return to work once you are cleared by your doctor.  . Do not drive a car for 6 weeks or until released by your surgeon.  . Do not drive while taking narcotics.  TED HOSE STOCKINGS Wear the elastic stockings on both legs for three weeks following surgery during the day. You may remove them at night  for sleeping.  WEIGHT BEARING Weight bearing as tolerated with assist device (walker, cane, etc) as directed, use it as long as suggested by your surgeon or therapist, typically at least 4-6 weeks.  POSTOPERATIVE CONSTIPATION PROTOCOL Constipation - defined medically as fewer than three stools per week and severe constipation as less than one stool per week.  One of the most common issues patients have following surgery is constipation.  Even if you have a regular bowel pattern at home, your normal regimen is likely to be disrupted due to multiple reasons following surgery.  Combination of anesthesia, postoperative narcotics, change in appetite and fluid intake all can affect your bowels.  In order to avoid complications following surgery, here are some recommendations in order to help you during your recovery period.  . Colace (docusate) - Pick up an over-the-counter form of Colace or another stool softener and take twice a day as long as you are requiring postoperative pain medications.  Take with a full glass of water daily.  If you experience loose stools or diarrhea, hold the colace until you stool forms back up. If your symptoms do not get better within 1 week or if they get worse, check with your doctor. . Dulcolax (bisacodyl) - Pick up over-the-counter and take as directed by the product packaging as needed to assist with the movement of your bowels.  Take with a full glass of water.  Use this product as needed if not relieved by Colace only.  . MiraLax (polyethylene glycol) - Pick up over-the-counter to have on hand. MiraLax is a solution that will increase the amount of water in your bowels to assist with bowel movements.  Take as directed and can mix with a glass of water, juice, soda, coffee, or tea. Take if you go more than two days without a movement. Do not use MiraLax more than once per day. Call your doctor if you are still constipated or irregular after using this medication for 7 days  in a row.  If you continue to have problems with postoperative constipation, please contact the office for further assistance and recommendations.  If you experience "the worst abdominal pain ever" or develop nausea or vomiting, please contact the office immediatly for further recommendations for treatment.  ITCHING If you experience itching with your medications, try taking only a single pain pill, or even half a pain pill at a time.  You can also use Benadryl over the counter for itching or also to help with sleep.   MEDICATIONS See your medication summary on the "After Visit Summary" that the nursing staff will review with you prior to discharge.  You may have some home medications which will be placed on hold until you complete the course of blood thinner medication.  It is important for you to complete the blood thinner medication as prescribed by your surgeon.  Continue your approved medications as instructed at time of discharge.  PRECAUTIONS . If you experience chest pain or shortness of   breath - call 911 immediately for transfer to the hospital emergency department.  . If you develop a fever greater that 101 F, purulent drainage from wound, increased redness or drainage from wound, foul odor from the wound/dressing, or calf pain - CONTACT YOUR SURGEON.                                                   FOLLOW-UP APPOINTMENTS Make sure you keep all of your appointments after your operation with your surgeon and caregivers. You should call the office at the above phone number and make an appointment for approximately two weeks after the date of your surgery or on the date instructed by your surgeon outlined in the "After Visit Summary".  RANGE OF MOTION AND STRENGTHENING EXERCISES  Rehabilitation of the knee is important following a knee injury or an operation. After just a few days of immobilization, the muscles of the thigh which control the knee become weakened and shrink (atrophy). Knee  exercises are designed to build up the tone and strength of the thigh muscles and to improve knee motion. Often times heat used for twenty to thirty minutes before working out will loosen up your tissues and help with improving the range of motion but do not use heat for the first two weeks following surgery. These exercises can be done on a training (exercise) mat, on the floor, on a table or on a bed. Use what ever works the best and is most comfortable for you Knee exercises include:  . Leg Lifts - While your knee is still immobilized in a splint or cast, you can do straight leg raises. Lift the leg to 60 degrees, hold for 3 sec, and slowly lower the leg. Repeat 10-20 times 2-3 times daily. Perform this exercise against resistance later as your knee gets better.  . Quad and Hamstring Sets - Tighten up the muscle on the front of the thigh (Quad) and hold for 5-10 sec. Repeat this 10-20 times hourly. Hamstring sets are done by pushing the foot backward against an object and holding for 5-10 sec. Repeat as with quad sets.   Leg Slides: Lying on your back, slowly slide your foot toward your buttocks, bending your knee up off the floor (only go as far as is comfortable). Then slowly slide your foot back down until your leg is flat on the floor again.  Angel Wings: Lying on your back spread your legs to the side as far apart as you can without causing discomfort.  A rehabilitation program following serious knee injuries can speed recovery and prevent re-injury in the future due to weakened muscles. Contact your doctor or a physical therapist for more information on knee rehabilitation.   IF YOU ARE TRANSFERRED TO A SKILLED REHAB FACILITY If the patient is transferred to a skilled rehab facility following release from the hospital, a list of the current medications will be sent to the facility for the patient to continue.  When discharged from the skilled rehab facility, please have the facility set up the  patient's Home Health Physical Therapy prior to being released. Also, the skilled facility will be responsible for providing the patient with their medications at time of release from the facility to include their pain medication, the muscle relaxants, and their blood thinner medication. If the patient is still at the   rehab facility at time of the two week follow up appointment, the skilled rehab facility will also need to assist the patient in arranging follow up appointment in our office and any transportation needs.  MAKE SURE YOU:  . Understand these instructions.  . Get help right away if you are not doing well or get worse.   DENTAL ANTIBIOTICS:  In most cases prophylactic antibiotics for Dental procdeures after total joint surgery are not necessary.  Exceptions are as follows:  1. History of prior total joint infection  2. Severely immunocompromised (Organ Transplant, cancer chemotherapy, Rheumatoid biologic meds such as Humera)  3. Poorly controlled diabetes (A1C &gt; 8.0, blood glucose over 200)  If you have one of these conditions, contact your surgeon for an antibiotic prescription, prior to your dental procedure.    Pick up stool softner and laxative for home use following surgery while on pain medications. Do not submerge incision under water. Please use good hand washing techniques while changing dressing each day. May shower starting three days after surgery. Please use a clean towel to pat the incision dry following showers. Continue to use ice for pain and swelling after surgery. Do not use any lotions or creams on the incision until instructed by your surgeon.  

## 2019-11-21 ENCOUNTER — Encounter (HOSPITAL_COMMUNITY): Payer: Self-pay | Admitting: Orthopedic Surgery

## 2019-11-21 DIAGNOSIS — Z96651 Presence of right artificial knee joint: Secondary | ICD-10-CM | POA: Diagnosis not present

## 2019-11-21 DIAGNOSIS — M1711 Unilateral primary osteoarthritis, right knee: Secondary | ICD-10-CM | POA: Diagnosis not present

## 2019-11-21 LAB — CBC
HCT: 38.1 % (ref 36.0–46.0)
Hemoglobin: 12.8 g/dL (ref 12.0–15.0)
MCH: 32.7 pg (ref 26.0–34.0)
MCHC: 33.6 g/dL (ref 30.0–36.0)
MCV: 97.2 fL (ref 80.0–100.0)
Platelets: 286 10*3/uL (ref 150–400)
RBC: 3.92 MIL/uL (ref 3.87–5.11)
RDW: 13 % (ref 11.5–15.5)
WBC: 10.3 10*3/uL (ref 4.0–10.5)
nRBC: 0 % (ref 0.0–0.2)

## 2019-11-21 LAB — BASIC METABOLIC PANEL
Anion gap: 5 (ref 5–15)
BUN: 14 mg/dL (ref 8–23)
CO2: 26 mmol/L (ref 22–32)
Calcium: 8.3 mg/dL — ABNORMAL LOW (ref 8.9–10.3)
Chloride: 106 mmol/L (ref 98–111)
Creatinine, Ser: 0.63 mg/dL (ref 0.44–1.00)
GFR, Estimated: >60 mL/min (ref >60)
Glucose, Bld: 118 mg/dL — ABNORMAL HIGH (ref 70–99)
Potassium: 3.9 mmol/L (ref 3.5–5.1)
Sodium: 137 mmol/L (ref 135–145)

## 2019-11-21 MED ORDER — GABAPENTIN 300 MG PO CAPS
ORAL_CAPSULE | ORAL | 0 refills | Status: DC
Start: 2019-11-21 — End: 2020-03-05

## 2019-11-21 MED ORDER — METHOCARBAMOL 500 MG PO TABS: 500.0000 mg | ORAL_TABLET | Freq: Four times a day (QID) | ORAL | 0 refills | Status: DC | PRN

## 2019-11-21 MED ORDER — SODIUM CHLORIDE 0.9 % IV BOLUS
500.0000 mL | Freq: Once | INTRAVENOUS | Status: AC
  Administered 2019-11-21: 500 mL via INTRAVENOUS

## 2019-11-21 MED ORDER — ASPIRIN 325 MG PO TBEC: 325.0000 mg | DELAYED_RELEASE_TABLET | Freq: Two times a day (BID) | ORAL | 0 refills | Status: AC

## 2019-11-21 MED ORDER — TRAMADOL HCL 50 MG PO TABS
50.0000 mg | ORAL_TABLET | Freq: Four times a day (QID) | ORAL | 0 refills | Status: DC | PRN
Start: 2019-11-21 — End: 2020-03-05

## 2019-11-21 MED ORDER — OXYCODONE HCL 5 MG PO TABS
5.0000 mg | ORAL_TABLET | Freq: Four times a day (QID) | ORAL | 0 refills | Status: DC | PRN
Start: 2019-11-21 — End: 2020-03-05

## 2019-11-21 NOTE — Progress Notes (Addendum)
Pt reported that the pt became dizzy and nauseous during PT and needed to sit down. BP while sitting was 80/36. Dizziness and nausea has subsided per pt. Kern Alberta, PA paged. Verbal orders for a 500 ml bolus given.  Post bolus BP 131/64. Pulse 64.

## 2019-11-21 NOTE — Progress Notes (Signed)
Physical Therapy Treatment Patient Details Name: Stacy Boyd MRN: [id number] DOB: 1955/09/20 Today's Date: 11/21/2019    History of Present Illness s/p R TKA. PMH: RA, depression, Sjogrens dz.    PT Comments    Pt feeling well, however after amb ~ 47' became nauseous and diaphoretic. BP 81/36. RN made aware. See below for details. Not ready to d/c at this time, will see how she progresses for pm session   Follow Up Recommendations  Follow surgeon's recommendation for DC plan and follow-up therapies     Equipment Recommendations  Rolling walker with 5" wheels    Recommendations for Other Services       Precautions / Restrictions Precautions Precautions: Knee;Fall Required Braces or Orthoses: Knee Immobilizer - Right Knee Immobilizer - Right: Discontinue once straight leg raise with < 10 degree lag Restrictions Weight Bearing Restrictions: No RLE Weight Bearing: Weight bearing as tolerated    Mobility  Bed Mobility Overal bed mobility: Needs Assistance Bed Mobility: Supine to Sit     Supine to sit: Supervision     General bed mobility comments: for safety  Transfers Overall transfer level: Needs assistance Equipment used: Rolling walker (2 wheeled) Transfers: Sit to/from Stand Sit to Stand: Min guard;Supervision         General transfer comment: cues for hand placement and RLE position  Ambulation/Gait Ambulation/Gait assistance: Min guard;Min assist Gait Distance (Feet): 65 Feet Assistive device: Rolling walker (2 wheeled) Gait Pattern/deviations: Step-to pattern;Decreased stance time - right;Decreased weight shift to right     General Gait Details: cues for sequence, RW position and safety with turns. after above distance pt became diaphoretic, had pt sit in chair in hallway, BP 81/36. after ~ 5 minutes pt was able to safely transfer to recliner.  RN made aware   Stairs             Wheelchair Mobility    Modified Rankin (Stroke Patients  Only)       Balance                                            Cognition Arousal/Alertness: Awake/alert Behavior During Therapy: WFL for tasks assessed/performed Overall Cognitive Status: Within Functional Limits for tasks assessed                                        Exercises Total Joint Exercises Ankle Circles/Pumps: AROM;Both;10 reps    General Comments        Pertinent Vitals/Pain Pain Assessment: 0-10 Pain Score: 3  Pain Location: right knee Pain Descriptors / Indicators: Discomfort;Sore Pain Intervention(s): Limited activity within patient's tolerance;Monitored during session;Premedicated before session;Ice applied    Home Living                      Prior Function            PT Goals (current goals can now be found in the care plan section) Acute Rehab PT Goals Patient Stated Goal: home tomorrow PT Goal Formulation: With patient Time For Goal Achievement: 11/27/19 Potential to Achieve Goals: Good Progress towards PT goals: Progressing toward goals    Frequency    7X/week      PT Plan Current plan remains appropriate    Co-evaluation  AM-PAC PT "6 Clicks" Mobility   Outcome Measure  Help needed turning from your back to your side while in a flat bed without using bedrails?: A Little Help needed moving from lying on your back to sitting on the side of a flat bed without using bedrails?: A Little Help needed moving to and from a bed to a chair (including a wheelchair)?: A Little Help needed standing up from a chair using your arms (e.g., wheelchair or bedside chair)?: A Little Help needed to walk in hospital room?: A Little Help needed climbing 3-5 steps with a railing? : A Little 6 Click Score: 18    End of Session Equipment Utilized During Treatment: Gait belt Activity Tolerance: Patient tolerated treatment well Patient left: with call bell/phone within reach;in chair;with chair  alarm set;with family/visitor present Nurse Communication: Other (comment) (low BP) PT Visit Diagnosis: Difficulty in walking, not elsewhere classified (R26.2)     Time: 4782-9562 PT Time Calculation (min) (ACUTE ONLY): 26 min  Charges:  $Gait Training: 23-37 mins                     Delice Bison, PT  Acute Rehab Dept ([facility]) [phone] Pager [phone]  11/21/2019    [facility] 11/21/2019, 12:29 PM

## 2019-11-21 NOTE — Plan of Care (Signed)
  Problem: Education: Goal: Knowledge of General Education information will improve Description: Including pain rating scale, medication(s)/side effects and non-pharmacologic comfort measures Outcome: Progressing   Problem: Health Behavior/Discharge Planning: Goal: Ability to manage health-related needs will improve Outcome: Progressing   Problem: Activity: Goal: Risk for activity intolerance will decrease Outcome: Progressing   

## 2019-11-21 NOTE — TOC Transition Note (Signed)
Transition of Care [facility]) - CM/SW Discharge Note   Patient Details  Name: Stacy Boyd MRN: [id number] Date of Birth: 1955-06-04  [facility]) CM/SW Contact:  Clearance Coots, LCSW Phone Number: 11/21/2019, 10:16 AM   Clinical Narrative:    Therapy Plan: OPPT @ [address] RW ordered through [employer]   Final next level of care: OP Rehab Barriers to Discharge: Barriers Resolved   Patient Goals and CMS Choice        Discharge Placement                       Discharge Plan and Services                DME Arranged: Walker rolling DME Agency: [employer] Date DME Agency Contacted: 11/21/19 Time DME Agency Contacted: [phone] Representative spoke with at DME Agency: Harrold Donath            Social Determinants of Health (SDOH) Interventions     Readmission Risk Interventions No flowsheet data found.

## 2019-11-21 NOTE — Progress Notes (Signed)
   Subjective: 1 Day Post-Op Procedure(s) (LRB): TOTAL KNEE ARTHROPLASTY (Right) Patient reports pain as mild.   Patient seen in rounds by Dr. Lequita Halt. Patient is well, and has had no acute complaints or problems. States she is ready to go home. Denies chest pain, SOB, or calf pain. Foley catheter removed this AM. No issues overnight.  We will continue therapy today, ambulated 77' yesterday.  Objective: Vital signs in last 24 hours: Temp:  [97.5 F (36.4 C)-98.6 F (37 C)] 97.8 F (36.6 C) (11/09 0549) Pulse Rate:  [46-73] 52 (11/09 0549) Resp:  [12-17] 12 (11/09 0549) BP: (99-137)/(53-69) 117/61 (11/09 0549) SpO2:  [94 %-100 %] 98 % (11/09 0549)  Intake/Output from previous day:  Intake/Output Summary (Last 24 hours) at 11/21/2019 0751 Last data filed at 11/21/2019 0600 Gross per 24 hour  Intake 1916.25 ml  Output 4760 ml  Net -2843.75 ml     Intake/Output this shift: No intake/output data recorded.  Labs: Recent Labs    11/21/19 0340  HGB 12.8   Recent Labs    11/21/19 0340  WBC 10.3  RBC 3.92  HCT 38.1  PLT 286   Recent Labs    11/21/19 0340  NA 137  K 3.9  CL 106  CO2 26  BUN 14  CREATININE 0.63  GLUCOSE 118*  CALCIUM 8.3*   No results for input(s): LABPT, INR in the last 72 hours.  Exam: General - Patient is Alert and Oriented Extremity - Neurologically intact Neurovascular intact Sensation intact distally Dorsiflexion/Plantar flexion intact Dressing - dressing C/D/I Motor Function - intact, moving foot and toes well on exam.   Past Medical History:  Diagnosis Date  . Anemia   . Arthritis   . Depression    Currently not being treated  . GERD (gastroesophageal reflux disease)   . H. pylori infection   . History of hiatal hernia   . Hypothyroid   . Pneumonia     Assessment/Plan: 1 Day Post-Op Procedure(s) (LRB): TOTAL KNEE ARTHROPLASTY (Right) Principal Problem:   Osteoarthritis, knee Active Problems:   Primary osteoarthritis  of right knee  Estimated body mass index is 31.15 kg/m as calculated from the following:   Height as of this encounter: 5\' 6"  (1.676 m).   Weight as of this encounter: 87.5 kg. Advance diet Up with therapy D/C IV fluids  Patient's anticipated LOS is less than 2 midnights, meeting these requirements: - Lives within 1 hour of care - Has a competent adult at home to recover with post-op recover - NO history of  - Chronic pain requiring opioids  - Diabetes  - Coronary Artery Disease  - Heart failure  - Heart attack  - Stroke  - DVT/VTE  - Cardiac arrhythmia  - Respiratory Failure/COPD  - Renal failure  - Anemia  - Advanced Liver disease  DVT Prophylaxis - Aspirin Weight bearing as tolerated. Continue therapy.  Plan is to go Home after hospital stay. Plan for discharge later today if cleared by therapy and meeting goals. Scheduled for OPPT at [facility]) Follow-up in the office November 23rd.  The PDMP database was reviewed today prior to any opioid medications being prescribed to this patient.  November 25, PA-C Orthopedic Surgery [phone] 11/21/2019, 7:51 AM

## 2019-11-21 NOTE — Progress Notes (Signed)
Physical Therapy Treatment Patient Details Name: Stacy Boyd MRN: [id number] DOB: [birth year] Today's Date: 11/21/2019    History of Present Illness s/p R TKA. PMH: RA, depression, Sjogrens dz.    PT Comments    Pt progressing well, very mild dizziness which did not change during mobility, likely d/t meds. Ready fo rd/c with family assist from PT standpoint  Follow Up Recommendations  Follow surgeon's recommendation for DC plan and follow-up therapies     Equipment Recommendations  Rolling walker with 5" wheels    Recommendations for Other Services       Precautions / Restrictions Precautions Precautions: Knee;Fall Required Braces or Orthoses: Knee Immobilizer - Right Knee Immobilizer - Right: Discontinue once straight leg raise with < 10 degree lag Restrictions RLE Weight Bearing: Weight bearing as tolerated    Mobility  Bed Mobility Overal bed mobility: Needs Assistance Bed Mobility: Sit to Supine     Supine to sit: Supervision Sit to supine: Supervision   General bed mobility comments: for safety  Transfers Overall transfer level: Needs assistance Equipment used: Rolling walker (2 wheeled) Transfers: Sit to/from Stand Sit to Stand: Supervision         General transfer comment: cues for hand placement and RLE position  Ambulation/Gait Ambulation/Gait assistance: Min guard Gait Distance (Feet): 70 Feet Assistive device: Rolling walker (2 wheeled) Gait Pattern/deviations: Step-to pattern;Decreased stance time - right;Decreased weight shift to right     General Gait Details: cues for sequence, RW position and safety with turns   Stairs Stairs: Yes Stairs assistance: Min assist;Min guard Stair Management: One rail Right;Step to pattern;Forwards;With cane Number of Stairs: 3 General stair comments: cues for sequence and safe technique    Wheelchair Mobility    Modified Rankin (Stroke Patients Only)       Balance                                             Cognition Arousal/Alertness: Awake/alert Behavior During Therapy: WFL for tasks assessed/performed Overall Cognitive Status: Within Functional Limits for tasks assessed                                        Exercises Total Joint Exercises Ankle Circles/Pumps: AROM;Both;10 reps Quad Sets: AROM;Both;10 reps Heel Slides: AAROM;Right;10 reps Hip ABduction/ADduction: AROM;Right;10 reps Straight Leg Raises: AROM;Right;10 reps;AAROM Goniometric ROM: ~ 6 to 65 degrees    General Comments        Pertinent Vitals/Pain Pain Assessment: 0-10 Pain Score: 3  Pain Location: right knee Pain Descriptors / Indicators: Discomfort;Sore Pain Intervention(s): Limited activity within patient's tolerance;Monitored during session;Premedicated before session;Repositioned    Home Living                      Prior Function            PT Goals (current goals can now be found in the care plan section) Acute Rehab PT Goals Patient Stated Goal: home tomorrow PT Goal Formulation: With patient Time For Goal Achievement: 11/27/19 Potential to Achieve Goals: Good Progress towards PT goals: Progressing toward goals    Frequency    7X/week      PT Plan Current plan remains appropriate    Co-evaluation  AM-PAC PT "6 Clicks" Mobility   Outcome Measure  Help needed turning from your back to your side while in a flat bed without using bedrails?: A Little Help needed moving from lying on your back to sitting on the side of a flat bed without using bedrails?: A Little Help needed moving to and from a bed to a chair (including a wheelchair)?: A Little Help needed standing up from a chair using your arms (e.g., wheelchair or bedside chair)?: A Little Help needed to walk in hospital room?: A Little Help needed climbing 3-5 steps with a railing? : A Little 6 Click Score: 18    End of Session Equipment Utilized During  Treatment: Gait belt Activity Tolerance: Patient tolerated treatment well Patient left: with call bell/phone within reach;in chair;with chair alarm set;with family/visitor present Nurse Communication: Other (comment) (low BP) PT Visit Diagnosis: Difficulty in walking, not elsewhere classified (R26.2)     Time: 1610-9604 PT Time Calculation (min) (ACUTE ONLY): 30 min  Charges:  $Gait Training: 8-22 mins $Therapeutic Exercise: 8-22 mins                     Delice Bison, PT  Acute Rehab Dept ([facility]) [phone] Pager [phone]  11/21/2019    Drucilla Chalet 11/21/2019, 2:22 PM

## 2019-11-27 DIAGNOSIS — R260 Ataxic gait: Secondary | ICD-10-CM | POA: Diagnosis not present

## 2019-11-27 DIAGNOSIS — Z471 Aftercare following joint replacement surgery: Secondary | ICD-10-CM | POA: Diagnosis not present

## 2019-11-28 ENCOUNTER — Ambulatory Visit: Payer: PPO | Admitting: Physical Therapy

## 2019-11-29 DIAGNOSIS — R260 Ataxic gait: Secondary | ICD-10-CM | POA: Diagnosis not present

## 2019-11-29 DIAGNOSIS — Z471 Aftercare following joint replacement surgery: Secondary | ICD-10-CM | POA: Diagnosis not present

## 2019-12-01 DIAGNOSIS — R260 Ataxic gait: Secondary | ICD-10-CM | POA: Diagnosis not present

## 2019-12-01 DIAGNOSIS — Z471 Aftercare following joint replacement surgery: Secondary | ICD-10-CM | POA: Diagnosis not present

## 2019-12-04 DIAGNOSIS — R260 Ataxic gait: Secondary | ICD-10-CM | POA: Diagnosis not present

## 2019-12-04 DIAGNOSIS — Z471 Aftercare following joint replacement surgery: Secondary | ICD-10-CM | POA: Diagnosis not present

## 2019-12-06 DIAGNOSIS — R260 Ataxic gait: Secondary | ICD-10-CM | POA: Diagnosis not present

## 2019-12-06 DIAGNOSIS — Z471 Aftercare following joint replacement surgery: Secondary | ICD-10-CM | POA: Diagnosis not present

## 2019-12-11 DIAGNOSIS — R260 Ataxic gait: Secondary | ICD-10-CM | POA: Diagnosis not present

## 2019-12-11 DIAGNOSIS — Z471 Aftercare following joint replacement surgery: Secondary | ICD-10-CM | POA: Diagnosis not present

## 2019-12-13 DIAGNOSIS — Z471 Aftercare following joint replacement surgery: Secondary | ICD-10-CM | POA: Diagnosis not present

## 2019-12-13 DIAGNOSIS — R260 Ataxic gait: Secondary | ICD-10-CM | POA: Diagnosis not present

## 2019-12-15 DIAGNOSIS — R6882 Decreased libido: Secondary | ICD-10-CM | POA: Diagnosis not present

## 2019-12-18 DIAGNOSIS — Z471 Aftercare following joint replacement surgery: Secondary | ICD-10-CM | POA: Diagnosis not present

## 2019-12-18 DIAGNOSIS — R260 Ataxic gait: Secondary | ICD-10-CM | POA: Diagnosis not present

## 2019-12-20 DIAGNOSIS — Z471 Aftercare following joint replacement surgery: Secondary | ICD-10-CM | POA: Diagnosis not present

## 2019-12-20 DIAGNOSIS — R260 Ataxic gait: Secondary | ICD-10-CM | POA: Diagnosis not present

## 2019-12-26 DIAGNOSIS — Z96651 Presence of right artificial knee joint: Secondary | ICD-10-CM | POA: Diagnosis not present

## 2019-12-27 DIAGNOSIS — R260 Ataxic gait: Secondary | ICD-10-CM | POA: Diagnosis not present

## 2019-12-27 DIAGNOSIS — Z471 Aftercare following joint replacement surgery: Secondary | ICD-10-CM | POA: Diagnosis not present

## 2019-12-29 DIAGNOSIS — Z471 Aftercare following joint replacement surgery: Secondary | ICD-10-CM | POA: Diagnosis not present

## 2019-12-29 DIAGNOSIS — R260 Ataxic gait: Secondary | ICD-10-CM | POA: Diagnosis not present

## 2020-01-01 DIAGNOSIS — Z471 Aftercare following joint replacement surgery: Secondary | ICD-10-CM | POA: Diagnosis not present

## 2020-01-01 DIAGNOSIS — R260 Ataxic gait: Secondary | ICD-10-CM | POA: Diagnosis not present

## 2020-01-01 DIAGNOSIS — M810 Age-related osteoporosis without current pathological fracture: Secondary | ICD-10-CM | POA: Diagnosis not present

## 2020-01-03 DIAGNOSIS — Z471 Aftercare following joint replacement surgery: Secondary | ICD-10-CM | POA: Diagnosis not present

## 2020-01-03 DIAGNOSIS — R260 Ataxic gait: Secondary | ICD-10-CM | POA: Diagnosis not present

## 2020-01-08 DIAGNOSIS — R260 Ataxic gait: Secondary | ICD-10-CM | POA: Diagnosis not present

## 2020-01-08 DIAGNOSIS — Z471 Aftercare following joint replacement surgery: Secondary | ICD-10-CM | POA: Diagnosis not present

## 2020-01-11 DIAGNOSIS — Z471 Aftercare following joint replacement surgery: Secondary | ICD-10-CM | POA: Diagnosis not present

## 2020-01-11 DIAGNOSIS — R260 Ataxic gait: Secondary | ICD-10-CM | POA: Diagnosis not present

## 2020-01-15 DIAGNOSIS — Z471 Aftercare following joint replacement surgery: Secondary | ICD-10-CM | POA: Diagnosis not present

## 2020-01-15 DIAGNOSIS — R260 Ataxic gait: Secondary | ICD-10-CM | POA: Diagnosis not present

## 2020-01-17 DIAGNOSIS — R260 Ataxic gait: Secondary | ICD-10-CM | POA: Diagnosis not present

## 2020-01-17 DIAGNOSIS — Z471 Aftercare following joint replacement surgery: Secondary | ICD-10-CM | POA: Diagnosis not present

## 2020-01-18 DIAGNOSIS — H40033 Anatomical narrow angle, bilateral: Secondary | ICD-10-CM | POA: Diagnosis not present

## 2020-01-18 DIAGNOSIS — H40012 Open angle with borderline findings, low risk, left eye: Secondary | ICD-10-CM | POA: Diagnosis not present

## 2020-01-18 DIAGNOSIS — H04123 Dry eye syndrome of bilateral lacrimal glands: Secondary | ICD-10-CM | POA: Diagnosis not present

## 2020-01-18 DIAGNOSIS — H0102A Squamous blepharitis right eye, upper and lower eyelids: Secondary | ICD-10-CM | POA: Diagnosis not present

## 2020-01-18 DIAGNOSIS — H0102B Squamous blepharitis left eye, upper and lower eyelids: Secondary | ICD-10-CM | POA: Diagnosis not present

## 2020-01-18 DIAGNOSIS — H25813 Combined forms of age-related cataract, bilateral: Secondary | ICD-10-CM | POA: Diagnosis not present

## 2020-01-18 DIAGNOSIS — H35381 Toxic maculopathy, right eye: Secondary | ICD-10-CM | POA: Diagnosis not present

## 2020-01-23 DIAGNOSIS — R260 Ataxic gait: Secondary | ICD-10-CM | POA: Diagnosis not present

## 2020-01-23 DIAGNOSIS — Z471 Aftercare following joint replacement surgery: Secondary | ICD-10-CM | POA: Diagnosis not present

## 2020-01-26 DIAGNOSIS — Z471 Aftercare following joint replacement surgery: Secondary | ICD-10-CM | POA: Diagnosis not present

## 2020-01-26 DIAGNOSIS — R260 Ataxic gait: Secondary | ICD-10-CM | POA: Diagnosis not present

## 2020-01-31 DIAGNOSIS — Z471 Aftercare following joint replacement surgery: Secondary | ICD-10-CM | POA: Diagnosis not present

## 2020-01-31 DIAGNOSIS — R260 Ataxic gait: Secondary | ICD-10-CM | POA: Diagnosis not present

## 2020-02-08 DIAGNOSIS — M25569 Pain in unspecified knee: Secondary | ICD-10-CM | POA: Diagnosis not present

## 2020-02-08 DIAGNOSIS — M170 Bilateral primary osteoarthritis of knee: Secondary | ICD-10-CM | POA: Diagnosis not present

## 2020-02-08 DIAGNOSIS — M810 Age-related osteoporosis without current pathological fracture: Secondary | ICD-10-CM | POA: Diagnosis not present

## 2020-02-08 DIAGNOSIS — R768 Other specified abnormal immunological findings in serum: Secondary | ICD-10-CM | POA: Diagnosis not present

## 2020-02-08 DIAGNOSIS — M1990 Unspecified osteoarthritis, unspecified site: Secondary | ICD-10-CM | POA: Diagnosis not present

## 2020-02-08 DIAGNOSIS — M3500 Sicca syndrome, unspecified: Secondary | ICD-10-CM | POA: Diagnosis not present

## 2020-02-20 NOTE — H&P (Signed)
TOTAL KNEE ADMISSION H&P  Patient is being admitted for left total knee arthroplasty.  Subjective:  Chief Complaint: Left knee pain.  HPI: Stacy Boyd, 65 y.o. female has a history of pain and functional disability in the left knee due to arthritis and has failed non-surgical conservative treatments for greater than 12 weeks to include NSAID's and/or analgesics and activity modification. Onset of symptoms was gradual, starting several years ago with gradually worsening course since that time. The patient noted no past surgery on the left knee.  Patient currently rates pain in the left knee at 5 out of 10 with activity. Patient has worsening of pain with activity and weight bearing. Patient has evidence of joint space narrowing by imaging studies. There is no active infection.  Patient Active Problem List   Diagnosis Date Noted  . Primary osteoarthritis of right knee 11/20/2019  . Disorder of thyroid gland 10/11/2019  . Obesity 04/03/2019  . Bilateral knee pain 12/14/2017  . Leukocytosis 11/12/2017  . Helicobacter pylori gastritis 04/22/2017  . Synovial cyst of left popliteal space 03/12/2017  . NSAID long-term use 02/16/2017  . Tricompartment osteoarthritis of knee 02/16/2017  . Acquired iron deficiency anemia due to decreased absorption- due to rheum meds and tums etc 02/16/2017  . Rheumatoid arthritis (HCC) 11/24/2016  . Low testosterone level in female 11/24/2016  . Family history of colon cancer 08/31/2014  . Bilateral leg edema 08/29/2013  . Osteoarthritis, knee 08/29/2012  . Acquired hypothyroidism 05/07/2010  . Osteopenia 05/07/2010  . Rosacea 05/07/2010  . Sjogren's disease (HCC) 05/07/2010  . Menopausal hot flushes 02/11/2009    Past Medical History:  Diagnosis Date  . Anemia   . Arthritis   . Depression    Currently not being treated  . GERD (gastroesophageal reflux disease)   . H. pylori infection   . History of hiatal hernia   . Hypothyroid   . Pneumonia      Past Surgical History:  Procedure Laterality Date  . ABDOMINAL HYSTERECTOMY    . CESAREAN SECTION    . COLONOSCOPY    . REFRACTIVE SURGERY    . TOTAL KNEE ARTHROPLASTY Right 11/20/2019   Procedure: TOTAL KNEE ARTHROPLASTY;  Surgeon: Ollen Gross, MD;  Location: [facility];  Service: Orthopedics;  Laterality: Right;     Prior to Admission medications   Medication Sig Start Date End Date Taking? Authorizing Provider  acetaminophen (TYLENOL) 650 MG CR tablet Take 1,300 mg by mouth in the morning and at bedtime.   Yes [provider]  Calcium-Phosphorus-Vitamin D (CITRACAL CALCIUM GUMMIES) 250-115-250 MG-MG-UNIT CHEW Chew 1 tablet by mouth daily.   Yes [provider]  cetirizine (ZYRTEC) 10 MG tablet Take 10 mg by mouth daily as needed for allergies.   Yes [provider]  Cholecalciferol (VITAMIN D-3) 125 MCG (5000 UT) TABS Take 5,000 Units by mouth daily.   Yes [provider]  cycloSPORINE, PF, (CEQUA) 0.09 % SOLN Place 1 drop into both eyes 2 (two) times daily.   Yes [provider]  diclofenac Sodium (VOLTAREN) 1 % GEL Apply 1 application topically 4 (four) times daily as needed (pain).   Yes [provider]  Eyelid Cleansers (THERATEARS STERILID CLEANSER EX) Apply 1 application topically 2 (two) times daily.   Yes [provider]  famotidine-calcium carbonate-magnesium hydroxide (PEPCID COMPLETE) 10-800-165 MG chewable tablet Chew 1 tablet by mouth at bedtime. Chewable   Yes [provider]  ibuprofen (ADVIL) 200 MG tablet Take 400 mg by  mouth 2 (two) times daily.   Yes [provider]  Melatonin 2.5 MG CAPS Take 2.5 mg by mouth at bedtime.   Yes [provider]  Multiple Vitamins-Minerals (CENTRUM SILVER 50+WOMEN PO) Take 1 tablet daily by mouth.   Yes [provider]  Omega-3 Fatty Acids (FISH OIL ULTRA) 1400 MG CAPS Take 1,400 mg by mouth daily.   Yes [provider]   Polyethyl Glycol-Propyl Glycol 0.4-0.3 % SOLN Place 1 drop into both eyes 4 (four) times daily as needed (Dry eyes).   Yes [provider]  SPIRULINA PO Take 3,000 mg by mouth daily.   Yes [provider]  testosterone cypionate (DEPOTESTOTERONE CYPIONATE) 100 MG/ML injection Inject 75 mg into the muscle See admin instructions. Every 12 weeks   Yes [provider]  TURMERIC PO Take 2,000 mg by mouth daily.   Yes [provider]  UNABLE TO FIND Take 1 capsule by mouth daily. Thyroid Medication Compounded at [facility]  Med Name: liothyronine sodium (t#)/thyroxine-L (T4) SR 21-89 mcg   Yes [provider]  gabapentin (NEURONTIN) 300 MG capsule Take a 300 mg capsule three times a day for two weeks following surgery.Then take a 300 mg capsule two times a day for two weeks. Then take a 300 mg capsule once a day for two weeks. Then discontinue. Patient not taking: No sig reported 11/21/19   Edmisten, Kristie L, PA  methocarbamol (ROBAXIN) 500 MG tablet Take 1 tablet (500 mg total) by mouth every 6 (six) hours as needed for muscle spasms. Patient not taking: No sig reported 11/21/19   Edmisten, Kristie L, PA  oxyCODONE (OXY IR/ROXICODONE) 5 MG immediate release tablet Take 1-2 tablets (5-10 mg total) by mouth every 6 (six) hours as needed for severe pain. Patient not taking: No sig reported 11/21/19   Edmisten, Kristie L, PA  traMADol (ULTRAM) 50 MG tablet Take 1-2 tablets (50-100 mg total) by mouth every 6 (six) hours as needed for moderate pain. Patient not taking: No sig reported 11/21/19   Arther Abbott L, PA    Allergies  Allergen Reactions  . Erythromycin Hives and Nausea Only    Social History   Socioeconomic History  . Marital status: Divorced    Spouse name: Not on file  . Number of children: Not on file  . Years of education: Not on file  . Highest education level: Not on file  Occupational History  . 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 Smoking status: Never Smoker  . Smokeless tobacco: Never Used  Vaping Use  . Vaping Use: Never used  Substance and Sexual Activity  . Alcohol use: Yes    Alcohol/week: 1.0 standard drink    Types: 1 Standard drinks or equivalent per week    Comment: occ  . Drug use: No  . Sexual activity: Not Currently    Birth control/protection: None, Post-menopausal  Other Topics Concern  . Not on file  Social History Narrative  . Not on file   Social Determinants of Health   Financial Resource Strain: Not on file  Food Insecurity: Not on file  Transportation Needs: Not on file  Physical Activity: Not on file  Stress: Not on file  Social Connections: Not on file  Intimate Partner Violence: Not on file    Tobacco Use: Low Risk   . Smoking Tobacco Use: Never Smoker  . Smokeless Tobacco Use: Never  Used   Social History   Substance and Sexual Activity  Alcohol Use Yes  . Alcohol/week: 1.0 standard drink  . Types: 1 Standard drinks or equivalent per week   Comment: occ    Family History  Problem Relation Age of Onset  . Colon cancer Father   . Breast cancer Sister   . Prostate cancer Brother   . Stomach cancer Neg Hx   . Rectal cancer Neg Hx   . Pancreatic cancer Neg Hx     Review of Systems  Constitutional: Negative for chills, fever and weight loss.  Respiratory: Negative for cough, shortness of breath and wheezing.   Cardiovascular: Negative for chest pain and palpitations.  Musculoskeletal: Positive for joint pain.  Neurological: Negative for dizziness, tingling and headaches.    Objective:  Physical Exam: ? Well nourished and well developed.  General: Alert and oriented x3, cooperative and pleasant, no acute distress.  Head: normocephalic, atraumatic, neck supple.  Eyes: EOMI.  Respiratory: breath sounds clear in all fields, no wheezing, rales, or rhonchi. Cardiovascular: Regular rate and  rhythm, no murmurs, gallops or rubs.  Abdomen: non-tender to palpation and soft, normoactive bowel sounds. Musculoskeletal: ? Left Knee: No tenderness to palpation about the medial or lateral joint line of the left knee. AROM 0-125 degrees. No effusion noted. No instability. Moderate patellofemoral crepitus.    ? Calves soft and nontender. Motor function intact in LE. Strength 5/5 LE bilaterally. Neuro: Distal pulses 2+. Sensation to light touch intact in LE.  Vital signs in last 24 hours: BP: ()/()  Arterial Line BP: ()/()   Imaging Review AP and lateral of the left knee dated 12/26/2019 demonstrate severe patellofemoral arthritis in the left knee, bone-on-bone with some erosion of the patella. She has some slight medial narrowing, but no evidence of any significant joint space loss. There are marginal osteophytes medially and laterally.   AP and lateral x-rays of the rightknee dated 12/26/2019 shows the prosthesis in good position with no periprosthetic abnormalities.   Assessment/Plan:  End stage arthritis, left knee   The patient history, physical examination, clinical judgment of the provider and imaging studies are consistent with end stage degenerative joint disease of the left knee and total knee arthroplasty is deemed medically necessary. The treatment options including medical management, injection therapy arthroscopy and arthroplasty were discussed at length. The risks and benefits of total knee arthroplasty were presented and reviewed. The risks due to aseptic loosening, infection, stiffness, patella tracking problems, thromboembolic complications and other imponderables were discussed. The patient acknowledged the explanation, agreed to proceed with the plan and consent was signed. Patient is being admitted for inpatient treatment for surgery, pain control, PT, OT, prophylactic antibiotics, VTE prophylaxis, progressive ambulation and ADLs and discharge planning. The patient is  planning to be discharged home.   Patient's anticipated LOS is less than 2 midnights, meeting these requirements: - Lives within 1 hour of care - Has a competent adult at home to recover with post-op recover - NO history of  - Chronic pain requiring opiods  - Diabetes  - Coronary Artery Disease  - Heart failure  - Heart attack  - Stroke  - DVT/VTE  - Cardiac arrhythmia  - Respiratory Failure/COPD  - Renal failure  - Anemia  - Advanced Liver disease  Therapy Plans: [facility]) Disposition: Home with daughter Planned DVT Prophylaxis: Aspirin 325mg  BID DME Needed: None PCP: Stacy Abonza, PA-C (Clearance received) TXA: IV Allergies: Erythromycin (hives) Anesthesia Concerns: None BMI: 29.9  Last HgbA1c: Not diabetic  Pharmacy: [facility]  - Patient was instructed on what medications to stop prior to surgery. - Follow-up visit in 2 weeks with Dr. Lequita Halt - Begin physical therapy following surgery - Pre-operative lab work as pre-surgical testing - Prescriptions will be provided in hospital at time of discharge  Nelia Shi, [facility], PA-C Orthopedic Surgery [facility] [place]

## 2020-02-22 NOTE — Patient Instructions (Addendum)
DUE TO COVID-19 ONLY ONE VISITOR IS ALLOWED TO COME WITH YOU AND STAY IN THE WAITING ROOM ONLY DURING PRE OP AND PROCEDURE DAY OF SURGERY. THE 1 VISITOR  MAY VISIT WITH YOU AFTER SURGERY IN YOUR PRIVATE ROOM DURING VISITING HOURS ONLY!  YOU NEED TO HAVE A COVID 19 TEST ON: 02/29/20 @ 10:00 AM , THIS TEST MUST BE DONE BEFORE SURGERY,  COVID TESTING SITE [address] [place] [address], IT IS ON THE RIGHT GOING OUT [address] APPROXIMATELY  2 MINUTES PAST [employer] ON THE RIGHT. ONCE YOUR COVID TEST IS COMPLETED,  PLEASE BEGIN THE QUARANTINE INSTRUCTIONS AS OUTLINED IN YOUR HANDOUT.                Sebrina Kessner    Your procedure is scheduled on: 03/04/20   Report to [facility] Main  Entrance   Report to admitting at: 9:00 AM     Call this number if you have problems the morning of surgery [phone]    Remember:   NO SOLID FOOD AFTER MIDNIGHT THE NIGHT PRIOR TO SURGERY. NOTHING BY MOUTH EXCEPT CLEAR LIQUIDS UNTIL: 8:30 AM . PLEASE FINISH ENSURE DRINK PER SURGEON ORDER  WHICH NEEDS TO BE COMPLETED AT: 8:30 AM .  CLEAR LIQUID DIET   Foods Allowed                                                                     Foods Excluded  Coffee and tea, regular and decaf                             liquids that you cannot  Plain Jell-O any favor except red or purple                                           see through such as: Fruit ices (not with fruit pulp)                                     milk, soups, orange juice  Iced Popsicles                                    All solid food Carbonated beverages, regular and diet                                    Cranberry, grape and apple juices Sports drinks like Gatorade Lightly seasoned clear broth or consume(fat free) Sugar, honey syrup  Sample Menu Breakfast                                Lunch  Supper Cranberry juice                    Beef broth                             Chicken broth Jell-O                                     Grape juice                           Apple juice Coffee or tea                        Jell-O                                      Popsicle                                                Coffee or tea                        Coffee or tea  _____________________________________________________________________   BRUSH YOUR TEETH MORNING OF SURGERY AND RINSE YOUR MOUTH OUT, NO CHEWING GUM CANDY OR MINTS.    Take these medicines the morning of surgery with A SIP OF WATER: cetirizine.Use eye drops as usual.                               You may not have any metal on your body including hair pins and              piercings  Do not wear jewelry, make-up, lotions, powders or perfumes, deodorant             Do not wear nail polish on your fingernails.  Do not shave  48 hours prior to surgery.    Do not bring valuables to the hospital. [facility] IS NOT             RESPONSIBLE   FOR VALUABLES.  Contacts, dentures or bridgework may not be worn into surgery.  Leave suitcase in the car. After surgery it may be brought to your room.     Patients discharged the day of surgery will not be allowed to drive home. IF YOU ARE HAVING SURGERY AND GOING HOME THE SAME DAY, YOU MUST HAVE AN ADULT TO DRIVE YOU HOME AND BE WITH YOU FOR 24 HOURS. YOU MAY GO HOME BY TAXI OR UBER OR ORTHERWISE, BUT AN ADULT MUST ACCOMPANY YOU HOME AND STAY WITH YOU FOR 24 HOURS.  Name and phone number of your driver:  Special Instructions: N/A              Please read over the following fact sheets you were given: _____________________________________________________________________         [facility] - Preparing for Surgery Before surgery, you can play an important role.  Because skin is not sterile, your skin needs to be as free of germs as  possible.  You can reduce the number of germs on your skin by washing with CHG (chlorahexidine gluconate) soap before surgery.   CHG is an antiseptic cleaner which kills germs and bonds with the skin to continue killing germs even after washing. Please DO NOT use if you have an allergy to CHG or antibacterial soaps.  If your skin becomes reddened/irritated stop using the CHG and inform your nurse when you arrive at Short Stay. Do not shave (including legs and underarms) for at least 48 hours prior to the first CHG shower.  You may shave your face/neck. Please follow these instructions carefully:  1.  Shower with CHG Soap the night before surgery and the  morning of Surgery.  2.  If you choose to wash your hair, wash your hair first as usual with your  normal  shampoo.  3.  After you shampoo, rinse your hair and body thoroughly to remove the  shampoo.                           4.  Use CHG as you would any other liquid soap.  You can apply chg directly  to the skin and wash                       Gently with a scrungie or clean washcloth.  5.  Apply the CHG Soap to your body ONLY FROM THE NECK DOWN.   Do not use on face/ open                           Wound or open sores. Avoid contact with eyes, ears mouth and genitals (private parts).                       Wash face,  Genitals (private parts) with your normal soap.             6.  Wash thoroughly, paying special attention to the area where your surgery  will be performed.  7.  Thoroughly rinse your body with warm water from the neck down.  8.  DO NOT shower/wash with your normal soap after using and rinsing off  the CHG Soap.                9.  Pat yourself dry with a clean towel.            10.  Wear clean pajamas.            11.  Place clean sheets on your bed the night of your first shower and do not  sleep with pets. Day of Surgery : Do not apply any lotions/deodorants the morning of surgery.  Please wear clean clothes to the hospital/surgery center.  FAILURE TO FOLLOW THESE INSTRUCTIONS MAY RESULT IN THE CANCELLATION OF YOUR SURGERY PATIENT  SIGNATURE_________________________________  NURSE SIGNATURE__________________________________  ________________________________________________________________________   Rogelia Mire  An incentive spirometer is a tool that can help keep your lungs clear and active. This tool measures how well you are filling your lungs with each breath. Taking long deep breaths may help reverse or decrease the chance of developing breathing (pulmonary) problems (especially infection) following:  A long period of time when you are unable to move or be active. BEFORE THE PROCEDURE   If the spirometer includes an indicator to show your best effort, your nurse or respiratory  therapist will set it to a desired goal.  If possible, sit up straight or lean slightly forward. Try not to slouch.  Hold the incentive spirometer in an upright position. INSTRUCTIONS FOR USE  1. Sit on the edge of your bed if possible, or sit up as far as you can in bed or on a chair. 2. Hold the incentive spirometer in an upright position. 3. Breathe out normally. 4. Place the mouthpiece in your mouth and seal your lips tightly around it. 5. Breathe in slowly and as deeply as possible, raising the piston or the ball toward the top of the column. 6. Hold your breath for 3-5 seconds or for as long as possible. Allow the piston or ball to fall to the bottom of the column. 7. Remove the mouthpiece from your mouth and breathe out normally. 8. Rest for a few seconds and repeat Steps 1 through 7 at least 10 times every 1-2 hours when you are awake. Take your time and take a few normal breaths between deep breaths. 9. The spirometer may include an indicator to show your best effort. Use the indicator as a goal to work toward during each repetition. 10. After each set of 10 deep breaths, practice coughing to be sure your lungs are clear. If you have an incision (the cut made at the time of surgery), support your incision when coughing  by placing a pillow or rolled up towels firmly against it. Once you are able to get out of bed, walk around indoors and cough well. You may stop using the incentive spirometer when instructed by your caregiver.  RISKS AND COMPLICATIONS  Take your time so you do not get dizzy or light-headed.  If you are in pain, you may need to take or ask for pain medication before doing incentive spirometry. It is harder to take a deep breath if you are having pain. AFTER USE  Rest and breathe slowly and easily.  It can be helpful to keep track of a log of your progress. Your caregiver can provide you with a simple table to help with this. If you are using the spirometer at home, follow these instructions: [place] IF:   You are having difficultly using the spirometer.  You have trouble using the spirometer as often as instructed.  Your pain medication is not giving enough relief while using the spirometer.  You develop fever of 100.5 F (38.1 C) or higher. SEEK IMMEDIATE MEDICAL CARE IF:   You cough up bloody sputum that had not been present before.  You develop fever of 102 F (38.9 C) or greater.  You develop worsening pain at or near the incision site. MAKE SURE YOU:   Understand these instructions.  Will watch your condition.  Will get help right away if you are not doing well or get worse. Document Released: 05/11/2006 Document Revised: 03/23/2011 Document Reviewed: 07/12/2006 [facility] Patient Information 2014 [place], [place].   ________________________________________________________________________

## 2020-02-23 ENCOUNTER — Other Ambulatory Visit: Payer: Self-pay

## 2020-02-23 ENCOUNTER — Encounter (HOSPITAL_COMMUNITY)
Admission: RE | Admit: 2020-02-23 | Discharge: 2020-02-23 | Disposition: A | Discharge status: Home / Self Care | Payer: PPO | Source: Ambulatory Visit | Attending: Orthopedic Surgery | Admitting: Orthopedic Surgery

## 2020-02-23 ENCOUNTER — Encounter (HOSPITAL_COMMUNITY): Payer: Self-pay

## 2020-02-23 DIAGNOSIS — Z01812 Encounter for preprocedural laboratory examination: Secondary | ICD-10-CM | POA: Insufficient documentation

## 2020-02-23 HISTORY — DX: Dry eye syndrome of unspecified lacrimal gland: H04.129

## 2020-02-23 HISTORY — DX: Prediabetes: R73.03

## 2020-02-23 LAB — COMPREHENSIVE METABOLIC PANEL
ALT: 20 U/L (ref 0–44)
AST: 22 U/L (ref 15–41)
Albumin: 4.3 g/dL (ref 3.5–5.0)
Alkaline Phosphatase: 59 U/L (ref 38–126)
Anion gap: 10 (ref 5–15)
BUN: 19 mg/dL (ref 8–23)
CO2: 26 mmol/L (ref 22–32)
Calcium: 9.3 mg/dL (ref 8.9–10.3)
Chloride: 102 mmol/L (ref 98–111)
Creatinine, Ser: 0.82 mg/dL (ref 0.44–1.00)
GFR, Estimated: >60 mL/min (ref >60)
Glucose, Bld: 107 mg/dL — ABNORMAL HIGH (ref 70–99)
Potassium: 4.1 mmol/L (ref 3.5–5.1)
Sodium: 138 mmol/L (ref 135–145)
Total Bilirubin: 0.6 mg/dL (ref 0.3–1.2)
Total Protein: 7.1 g/dL (ref 6.5–8.1)

## 2020-02-23 LAB — HEMOGLOBIN A1C
Hgb A1c MFr Bld: 5.5 % (ref 4.8–5.6)
Mean Plasma Glucose: 111.15 mg/dL

## 2020-02-23 LAB — SURGICAL PCR SCREEN
MRSA, PCR: NEGATIVE
Staphylococcus aureus: NEGATIVE

## 2020-02-23 LAB — CBC
HCT: 40.5 % (ref 36.0–46.0)
Hemoglobin: 13.5 g/dL (ref 12.0–15.0)
MCH: 32.1 pg (ref 26.0–34.0)
MCHC: 33.3 g/dL (ref 30.0–36.0)
MCV: 96.2 fL (ref 80.0–100.0)
Platelets: 339 10*3/uL (ref 150–400)
RBC: 4.21 MIL/uL (ref 3.87–5.11)
RDW: 12.7 % (ref 11.5–15.5)
WBC: 5.6 10*3/uL (ref 4.0–10.5)
nRBC: 0 % (ref 0.0–0.2)

## 2020-02-23 LAB — PROTIME-INR
INR: 0.9 (ref 0.8–1.2)
Prothrombin Time: 11.8 seconds (ref 11.4–15.2)

## 2020-02-23 LAB — APTT: aPTT: 33 seconds (ref 24–36)

## 2020-02-23 NOTE — Progress Notes (Signed)
COVID Vaccine Completed: Yes Date COVID Vaccine completed: 11/27/19 [place] COVID vaccine manufacturer: Pfizer     PCP - Mayer Masker: PA-C Cardiologist -   Chest x-ray -  EKG -  Stress Test -  ECHO -  Cardiac Cath -  Pacemaker/ICD device last checked:  Sleep Study -  CPAP -   Fasting Blood Sugar -  Checks Blood Sugar _____ times a day  Blood Thinner Instructions: Aspirin Instructions: Last Dose:  Anesthesia review:   Patient denies shortness of breath, fever, cough and chest pain at PAT appointment   Patient verbalized understanding of instructions that were given to them at the PAT appointment. Patient was also instructed that they will need to review over the PAT instructions again at home before surgery.

## 2020-02-28 DIAGNOSIS — H0102B Squamous blepharitis left eye, upper and lower eyelids: Secondary | ICD-10-CM | POA: Diagnosis not present

## 2020-02-28 DIAGNOSIS — H35381 Toxic maculopathy, right eye: Secondary | ICD-10-CM | POA: Diagnosis not present

## 2020-02-28 DIAGNOSIS — H40033 Anatomical narrow angle, bilateral: Secondary | ICD-10-CM | POA: Diagnosis not present

## 2020-02-28 DIAGNOSIS — H04123 Dry eye syndrome of bilateral lacrimal glands: Secondary | ICD-10-CM | POA: Diagnosis not present

## 2020-02-28 DIAGNOSIS — H40012 Open angle with borderline findings, low risk, left eye: Secondary | ICD-10-CM | POA: Diagnosis not present

## 2020-02-28 DIAGNOSIS — H524 Presbyopia: Secondary | ICD-10-CM | POA: Diagnosis not present

## 2020-02-28 DIAGNOSIS — H0102A Squamous blepharitis right eye, upper and lower eyelids: Secondary | ICD-10-CM | POA: Diagnosis not present

## 2020-02-28 DIAGNOSIS — H25813 Combined forms of age-related cataract, bilateral: Secondary | ICD-10-CM | POA: Diagnosis not present

## 2020-02-29 ENCOUNTER — Other Ambulatory Visit (HOSPITAL_COMMUNITY)
Admission: RE | Admit: 2020-02-29 | Discharge: 2020-02-29 | Disposition: A | Discharge status: Home / Self Care | Payer: PPO | Source: Ambulatory Visit | Attending: Orthopedic Surgery | Admitting: Orthopedic Surgery

## 2020-02-29 DIAGNOSIS — Z20822 Contact with and (suspected) exposure to covid-19: Secondary | ICD-10-CM | POA: Insufficient documentation

## 2020-02-29 DIAGNOSIS — Z01812 Encounter for preprocedural laboratory examination: Secondary | ICD-10-CM | POA: Diagnosis not present

## 2020-02-29 LAB — SARS CORONAVIRUS 2 (TAT 6-24 HRS): SARS Coronavirus 2: NEGATIVE

## 2020-03-03 MED ORDER — BUPIVACAINE LIPOSOME 1.3 % IJ SUSP
20.0000 mL | Freq: Once | INTRAMUSCULAR | Status: DC
  Filled 2020-03-03: qty 20

## 2020-03-04 ENCOUNTER — Ambulatory Visit (HOSPITAL_COMMUNITY): Payer: PPO | Admitting: Certified Registered Nurse Anesthetist

## 2020-03-04 ENCOUNTER — Encounter (HOSPITAL_COMMUNITY)
Admission: RE | Disposition: A | Discharge status: Home / Self Care | Payer: Self-pay | Source: Home / Self Care | Attending: Orthopedic Surgery

## 2020-03-04 ENCOUNTER — Observation Stay (HOSPITAL_COMMUNITY)
Admission: RE | Admit: 2020-03-04 | Discharge: 2020-03-05 | Disposition: A | Discharge status: Home / Self Care | Payer: PPO | Attending: Orthopedic Surgery | Admitting: Orthopedic Surgery

## 2020-03-04 ENCOUNTER — Other Ambulatory Visit: Payer: Self-pay

## 2020-03-04 ENCOUNTER — Encounter (HOSPITAL_COMMUNITY): Payer: Self-pay | Admitting: Orthopedic Surgery

## 2020-03-04 DIAGNOSIS — Z96651 Presence of right artificial knee joint: Secondary | ICD-10-CM | POA: Insufficient documentation

## 2020-03-04 DIAGNOSIS — Z79899 Other long term (current) drug therapy: Secondary | ICD-10-CM | POA: Insufficient documentation

## 2020-03-04 DIAGNOSIS — E039 Hypothyroidism, unspecified: Secondary | ICD-10-CM | POA: Diagnosis not present

## 2020-03-04 DIAGNOSIS — F32A Depression, unspecified: Secondary | ICD-10-CM | POA: Diagnosis not present

## 2020-03-04 DIAGNOSIS — M1712 Unilateral primary osteoarthritis, left knee: Secondary | ICD-10-CM | POA: Diagnosis not present

## 2020-03-04 DIAGNOSIS — G8918 Other acute postprocedural pain: Secondary | ICD-10-CM | POA: Diagnosis not present

## 2020-03-04 HISTORY — PX: TOTAL KNEE ARTHROPLASTY: SHX125

## 2020-03-04 LAB — TYPE AND SCREEN
ABO/RH(D): B POS
Antibody Screen: NEGATIVE

## 2020-03-04 SURGERY — ARTHROPLASTY, KNEE, TOTAL
Anesthesia: Monitor Anesthesia Care | Site: Knee | Laterality: Left

## 2020-03-04 MED ORDER — OXYCODONE HCL 5 MG PO TABS
5.0000 mg | ORAL_TABLET | ORAL | Status: DC | PRN
  Administered 2020-03-04: 5 mg via ORAL
  Administered 2020-03-04 – 2020-03-05 (×2): 10 mg via ORAL
  Filled 2020-03-04 (×3): qty 2

## 2020-03-04 MED ORDER — FAMOTIDINE-CA CARB-MAG HYDROX 10-800-165 MG PO CHEW: 1.0000 | CHEWABLE_TABLET | Freq: Every day | ORAL | Status: DC

## 2020-03-04 MED ORDER — ACETAMINOPHEN 500 MG PO TABS
1000.0000 mg | ORAL_TABLET | Freq: Four times a day (QID) | ORAL | Status: DC
  Administered 2020-03-04 – 2020-03-05 (×3): 1000 mg via ORAL
  Filled 2020-03-04 (×3): qty 2

## 2020-03-04 MED ORDER — LACTATED RINGERS IV SOLN: INTRAVENOUS | Status: DC

## 2020-03-04 MED ORDER — PROPOFOL 500 MG/50ML IV EMUL
INTRAVENOUS | Status: DC | PRN
  Administered 2020-03-04: 100 ug/kg/min via INTRAVENOUS
  Administered 2020-03-04: 125 ug/kg/min via INTRAVENOUS

## 2020-03-04 MED ORDER — ONDANSETRON HCL 4 MG/2ML IJ SOLN
INTRAMUSCULAR | Status: DC | PRN
  Administered 2020-03-04: 4 mg via INTRAVENOUS

## 2020-03-04 MED ORDER — ACETAMINOPHEN 10 MG/ML IV SOLN
1000.0000 mg | Freq: Four times a day (QID) | INTRAVENOUS | Status: DC
  Administered 2020-03-04: 1000 mg via INTRAVENOUS
  Filled 2020-03-04: qty 100

## 2020-03-04 MED ORDER — TRANEXAMIC ACID-NACL 1000-0.7 MG/100ML-% IV SOLN
1000.0000 mg | INTRAVENOUS | Status: AC
  Administered 2020-03-04: 1000 mg via INTRAVENOUS
  Filled 2020-03-04: qty 100

## 2020-03-04 MED ORDER — SODIUM CHLORIDE 0.9 % IV SOLN: INTRAVENOUS | Status: DC

## 2020-03-04 MED ORDER — METHOCARBAMOL 500 MG PO TABS
500.0000 mg | ORAL_TABLET | Freq: Four times a day (QID) | ORAL | Status: DC | PRN
  Administered 2020-03-04: 500 mg via ORAL
  Filled 2020-03-04 (×2): qty 1

## 2020-03-04 MED ORDER — BUPIVACAINE LIPOSOME 1.3 % IJ SUSP
INTRAMUSCULAR | Status: DC | PRN
  Administered 2020-03-04: 20 mL

## 2020-03-04 MED ORDER — SODIUM CHLORIDE (PF) 0.9 % IJ SOLN
INTRAMUSCULAR | Status: AC
  Filled 2020-03-04: qty 10

## 2020-03-04 MED ORDER — CEFAZOLIN SODIUM-DEXTROSE 2-4 GM/100ML-% IV SOLN
2.0000 g | INTRAVENOUS | Status: AC
  Administered 2020-03-04: 2 g via INTRAVENOUS
  Filled 2020-03-04: qty 100

## 2020-03-04 MED ORDER — BUPIVACAINE IN DEXTROSE 0.75-8.25 % IT SOLN
INTRATHECAL | Status: DC | PRN
  Administered 2020-03-04: 1.6 mL via INTRATHECAL

## 2020-03-04 MED ORDER — FENTANYL CITRATE (PF) 100 MCG/2ML IJ SOLN
INTRAMUSCULAR | Status: AC
  Filled 2020-03-04: qty 2

## 2020-03-04 MED ORDER — ROPIVACAINE HCL 7.5 MG/ML IJ SOLN
INTRAMUSCULAR | Status: DC | PRN
  Administered 2020-03-04: 20 mL via PERINEURAL

## 2020-03-04 MED ORDER — GABAPENTIN 300 MG PO CAPS
300.0000 mg | ORAL_CAPSULE | Freq: Three times a day (TID) | ORAL | Status: DC
  Administered 2020-03-04 – 2020-03-05 (×3): 300 mg via ORAL
  Filled 2020-03-04 (×3): qty 1

## 2020-03-04 MED ORDER — FLEET ENEMA 7-19 GM/118ML RE ENEM: 1.0000 | ENEMA | Freq: Once | RECTAL | Status: DC | PRN

## 2020-03-04 MED ORDER — MIDAZOLAM HCL 2 MG/2ML IJ SOLN
1.0000 mg | INTRAMUSCULAR | Status: DC
  Administered 2020-03-04: 2 mg via INTRAVENOUS
  Filled 2020-03-04: qty 2

## 2020-03-04 MED ORDER — STERILE WATER FOR IRRIGATION IR SOLN
Status: DC | PRN
  Administered 2020-03-04: 2000 mL

## 2020-03-04 MED ORDER — CHLORHEXIDINE GLUCONATE 0.12 % MT SOLN
15.0000 mL | Freq: Once | OROMUCOSAL | Status: AC
  Administered 2020-03-04: 15 mL via OROMUCOSAL

## 2020-03-04 MED ORDER — METHOCARBAMOL 500 MG IVPB - SIMPLE MED
INTRAVENOUS | Status: AC
  Filled 2020-03-04: qty 50

## 2020-03-04 MED ORDER — METOCLOPRAMIDE HCL 5 MG PO TABS: 5.0000 mg | ORAL_TABLET | Freq: Three times a day (TID) | ORAL | Status: DC | PRN

## 2020-03-04 MED ORDER — DEXAMETHASONE SODIUM PHOSPHATE 10 MG/ML IJ SOLN
8.0000 mg | Freq: Once | INTRAMUSCULAR | Status: AC
  Administered 2020-03-04: 8 mg via INTRAVENOUS

## 2020-03-04 MED ORDER — TRAMADOL HCL 50 MG PO TABS
50.0000 mg | ORAL_TABLET | Freq: Four times a day (QID) | ORAL | Status: DC | PRN
  Administered 2020-03-05: 100 mg via ORAL
  Filled 2020-03-04: qty 2

## 2020-03-04 MED ORDER — 0.9 % SODIUM CHLORIDE (POUR BTL) OPTIME
TOPICAL | Status: DC | PRN
  Administered 2020-03-04: 1000 mL

## 2020-03-04 MED ORDER — FENTANYL CITRATE (PF) 100 MCG/2ML IJ SOLN
INTRAMUSCULAR | Status: DC | PRN
  Administered 2020-03-04 (×2): 50 ug via INTRAVENOUS

## 2020-03-04 MED ORDER — DIPHENHYDRAMINE HCL 12.5 MG/5ML PO ELIX: 12.5000 mg | ORAL_SOLUTION | ORAL | Status: DC | PRN

## 2020-03-04 MED ORDER — FENTANYL CITRATE (PF) 100 MCG/2ML IJ SOLN
25.0000 ug | INTRAMUSCULAR | Status: DC | PRN
  Administered 2020-03-04 (×2): 50 ug via INTRAVENOUS

## 2020-03-04 MED ORDER — POVIDONE-IODINE 10 % EX SWAB
2.0000 "application " | Freq: Once | CUTANEOUS | Status: AC
  Administered 2020-03-04: 2 via TOPICAL

## 2020-03-04 MED ORDER — OXYCODONE HCL 5 MG/5ML PO SOLN: 5.0000 mg | Freq: Once | ORAL | Status: DC | PRN

## 2020-03-04 MED ORDER — POLYETHYLENE GLYCOL 3350 17 G PO PACK: 17.0000 g | PACK | Freq: Every day | ORAL | Status: DC | PRN

## 2020-03-04 MED ORDER — PROMETHAZINE HCL 25 MG/ML IJ SOLN: 6.2500 mg | INTRAMUSCULAR | Status: DC | PRN

## 2020-03-04 MED ORDER — MORPHINE SULFATE (PF) 4 MG/ML IV SOLN: 0.5000 mg | INTRAVENOUS | Status: DC | PRN

## 2020-03-04 MED ORDER — BISACODYL 10 MG RE SUPP: 10.0000 mg | Freq: Every day | RECTAL | Status: DC | PRN

## 2020-03-04 MED ORDER — PHENOL 1.4 % MT LIQD: 1.0000 | OROMUCOSAL | Status: DC | PRN

## 2020-03-04 MED ORDER — SODIUM CHLORIDE (PF) 0.9 % IJ SOLN
INTRAMUSCULAR | Status: DC | PRN
  Administered 2020-03-04: 60 mL

## 2020-03-04 MED ORDER — SODIUM CHLORIDE 0.9 % IR SOLN
Status: DC | PRN
  Administered 2020-03-04: 1000 mL

## 2020-03-04 MED ORDER — METHOCARBAMOL 500 MG IVPB - SIMPLE MED
500.0000 mg | Freq: Four times a day (QID) | INTRAVENOUS | Status: DC | PRN
  Administered 2020-03-04: 500 mg via INTRAVENOUS
  Filled 2020-03-04: qty 50

## 2020-03-04 MED ORDER — MENTHOL 3 MG MT LOZG: 1.0000 | LOZENGE | OROMUCOSAL | Status: DC | PRN

## 2020-03-04 MED ORDER — OXYCODONE HCL 5 MG PO TABS
5.0000 mg | ORAL_TABLET | Freq: Once | ORAL | Status: DC | PRN
Start: 2020-03-04 — End: 2020-03-04

## 2020-03-04 MED ORDER — FAMOTIDINE 20 MG PO TABS
10.0000 mg | ORAL_TABLET | Freq: Every day | ORAL | Status: DC
  Administered 2020-03-04: 10 mg via ORAL
  Filled 2020-03-04: qty 1

## 2020-03-04 MED ORDER — ORAL CARE MOUTH RINSE: 15.0000 mL | Freq: Once | OROMUCOSAL | Status: AC

## 2020-03-04 MED ORDER — FENTANYL CITRATE (PF) 100 MCG/2ML IJ SOLN
50.0000 ug | INTRAMUSCULAR | Status: DC
  Administered 2020-03-04: 100 ug via INTRAVENOUS
  Filled 2020-03-04: qty 2

## 2020-03-04 MED ORDER — ONDANSETRON HCL 4 MG PO TABS: 4.0000 mg | ORAL_TABLET | Freq: Four times a day (QID) | ORAL | Status: DC | PRN

## 2020-03-04 MED ORDER — METOCLOPRAMIDE HCL 5 MG/ML IJ SOLN: 5.0000 mg | Freq: Three times a day (TID) | INTRAMUSCULAR | Status: DC | PRN

## 2020-03-04 MED ORDER — PROPOFOL 10 MG/ML IV BOLUS
INTRAVENOUS | Status: DC | PRN
  Administered 2020-03-04 (×3): 20 mg via INTRAVENOUS

## 2020-03-04 MED ORDER — DOCUSATE SODIUM 100 MG PO CAPS
100.0000 mg | ORAL_CAPSULE | Freq: Two times a day (BID) | ORAL | Status: DC
  Administered 2020-03-04 – 2020-03-05 (×2): 100 mg via ORAL
  Filled 2020-03-04 (×2): qty 1

## 2020-03-04 MED ORDER — LORATADINE 10 MG PO TABS
10.0000 mg | ORAL_TABLET | Freq: Every day | ORAL | Status: DC
  Filled 2020-03-04: qty 1

## 2020-03-04 MED ORDER — ONDANSETRON HCL 4 MG/2ML IJ SOLN: 4.0000 mg | Freq: Four times a day (QID) | INTRAMUSCULAR | Status: DC | PRN

## 2020-03-04 MED ORDER — CEFAZOLIN SODIUM-DEXTROSE 2-4 GM/100ML-% IV SOLN
2.0000 g | Freq: Four times a day (QID) | INTRAVENOUS | Status: AC
Start: 2020-03-04 — End: 2020-03-05
  Administered 2020-03-04 (×2): 2 g via INTRAVENOUS
  Filled 2020-03-04 (×2): qty 100

## 2020-03-04 MED ORDER — DEXAMETHASONE SODIUM PHOSPHATE 10 MG/ML IJ SOLN
10.0000 mg | Freq: Once | INTRAMUSCULAR | Status: AC
  Administered 2020-03-05: 10 mg via INTRAVENOUS
  Filled 2020-03-04: qty 1

## 2020-03-04 MED ORDER — ASPIRIN EC 325 MG PO TBEC
325.0000 mg | DELAYED_RELEASE_TABLET | Freq: Two times a day (BID) | ORAL | Status: DC
  Administered 2020-03-05: 325 mg via ORAL
  Filled 2020-03-04: qty 1

## 2020-03-04 MED ORDER — ACETAMINOPHEN 10 MG/ML IV SOLN: 1000.0000 mg | Freq: Once | INTRAVENOUS | Status: DC | PRN

## 2020-03-04 SURGICAL SUPPLY — 53 items
ATTUNE PS FEM LT SZ 6 CEM KNEE (Femur) ×2 IMPLANT
ATTUNE PSRP INSR SZ6 10 KNEE (Insert) ×2 IMPLANT
BAG ZIPLOCK 12X15 (MISCELLANEOUS) ×2 IMPLANT
BASE TIBIAL ROT PLAT SZ 5 KNEE (Knees) ×1 IMPLANT
BLADE SAG 18X100X1.27 (BLADE) ×2 IMPLANT
BLADE SAW SGTL 11.0X1.19X90.0M (BLADE) ×2 IMPLANT
BNDG ELASTIC 6X5.8 VLCR STR LF (GAUZE/BANDAGES/DRESSINGS) ×2 IMPLANT
BOWL SMART MIX CTS (DISPOSABLE) ×2 IMPLANT
CEMENT HV SMART SET (Cement) ×4 IMPLANT
CLSR STERI-STRIP ANTIMIC 1/2X4 (GAUZE/BANDAGES/DRESSINGS) ×2 IMPLANT
COVER SURGICAL LIGHT HANDLE (MISCELLANEOUS) ×2 IMPLANT
COVER WAND RF STERILE (DRAPES) IMPLANT
CUFF TOURN SGL QUICK 34 (TOURNIQUET CUFF) ×2
CUFF TRNQT CYL 34X4.125X (TOURNIQUET CUFF) ×1 IMPLANT
DECANTER SPIKE VIAL GLASS SM (MISCELLANEOUS) ×2 IMPLANT
DRAPE U-SHAPE 47X51 STRL (DRAPES) ×2 IMPLANT
DRSG AQUACEL AG ADV 3.5X10 (GAUZE/BANDAGES/DRESSINGS) ×2 IMPLANT
DURAPREP 26ML APPLICATOR (WOUND CARE) ×2 IMPLANT
ELECT REM PT RETURN 15FT ADLT (MISCELLANEOUS) ×2 IMPLANT
GLOVE SRG 8 PF TXTR STRL LF DI (GLOVE) ×1 IMPLANT
GLOVE SURG ENC MOIS LTX SZ6 (GLOVE) IMPLANT
GLOVE SURG ENC MOIS LTX SZ7 (GLOVE) ×2 IMPLANT
GLOVE SURG ENC MOIS LTX SZ8 (GLOVE) ×2 IMPLANT
GLOVE SURG UNDER POLY LF SZ6.5 (GLOVE) ×2 IMPLANT
GLOVE SURG UNDER POLY LF SZ8 (GLOVE) ×2
GLOVE SURG UNDER POLY LF SZ8.5 (GLOVE) ×2 IMPLANT
GOWN STRL REUS W/TWL LRG LVL3 (GOWN DISPOSABLE) ×4 IMPLANT
GOWN STRL REUS W/TWL XL LVL3 (GOWN DISPOSABLE) ×2 IMPLANT
HANDPIECE INTERPULSE COAX TIP (DISPOSABLE) ×2
HOLDER FOLEY CATH W/STRAP (MISCELLANEOUS) IMPLANT
IMMOBILIZER KNEE 20 (SOFTGOODS) ×2
IMMOBILIZER KNEE 20 THIGH 36 (SOFTGOODS) ×1 IMPLANT
KIT TURNOVER KIT A (KITS) ×2 IMPLANT
MANIFOLD NEPTUNE II (INSTRUMENTS) ×2 IMPLANT
NS IRRIG 1000ML POUR BTL (IV SOLUTION) ×2 IMPLANT
PACK TOTAL KNEE CUSTOM (KITS) ×2 IMPLANT
PADDING CAST COTTON 6X4 STRL (CAST SUPPLIES) ×2 IMPLANT
PATELLA MEDIAL ATTUN 35MM KNEE (Knees) ×2 IMPLANT
PENCIL SMOKE EVACUATOR (MISCELLANEOUS) ×2 IMPLANT
PIN DRILL FIX HALF THREAD (BIT) ×2 IMPLANT
PIN STEINMAN FIXATION KNEE (PIN) ×2 IMPLANT
PROTECTOR NERVE ULNAR (MISCELLANEOUS) ×2 IMPLANT
SET HNDPC FAN SPRY TIP SCT (DISPOSABLE) ×1 IMPLANT
STRIP CLOSURE SKIN 1/2X4 (GAUZE/BANDAGES/DRESSINGS) ×4 IMPLANT
SUT MNCRL AB 4-0 PS2 18 (SUTURE) ×2 IMPLANT
SUT STRATAFIX 0 PDS 27 VIOLET (SUTURE) ×2
SUT VIC AB 2-0 CT1 27 (SUTURE) ×6
SUT VIC AB 2-0 CT1 TAPERPNT 27 (SUTURE) ×3 IMPLANT
SUTURE STRATFX 0 PDS 27 VIOLET (SUTURE) ×1 IMPLANT
TIBIAL BASE ROT PLAT SZ 5 KNEE (Knees) ×2 IMPLANT
TRAY FOLEY MTR SLVR 16FR STAT (SET/KITS/TRAYS/PACK) ×2 IMPLANT
WATER STERILE IRR 1000ML POUR (IV SOLUTION) ×4 IMPLANT
WRAP KNEE MAXI GEL POST OP (GAUZE/BANDAGES/DRESSINGS) ×2 IMPLANT

## 2020-03-04 NOTE — Anesthesia Postprocedure Evaluation (Signed)
Anesthesia Post Note  Patient: Jalila Goodnough  Procedure(s) Performed: TOTAL KNEE ARTHROPLASTY (Left Knee)     Patient location during evaluation: PACU Anesthesia Type: Regional, MAC and Spinal Level of consciousness: oriented and awake and alert Pain management: pain level controlled Vital Signs Assessment: post-procedure vital signs reviewed and stable Respiratory status: spontaneous breathing, respiratory function stable and patient connected to nasal cannula oxygen Cardiovascular status: blood pressure returned to baseline and stable Postop Assessment: no headache, no backache and no apparent nausea or vomiting Anesthetic complications: no   No complications documented.  Last Vitals:  Vitals:   03/04/20 1540 03/04/20 1648  BP: (!) 159/84 (!) 156/67  Pulse: (!) 57 (!) 52  Resp: 16 16  Temp:    SpO2: 100% 99%    Last Pain:  Vitals:   03/04/20 1452  TempSrc: Axillary  PainSc:                  March Rummage Akyia Borelli

## 2020-03-04 NOTE — Op Note (Signed)
OPERATIVE REPORT-TOTAL KNEE ARTHROPLASTY   Pre-operative diagnosis- Osteoarthritis  Left knee(s)  Post-operative diagnosis- Osteoarthritis Left knee(s)  Procedure-  Left  Total Knee Arthroplasty  Surgeon- Gus Rankin. Lori-Ann Lindfors, MD  Assistant- Arther Abbott, PA-C   Anesthesia-  Adductor canal block and spinal  EBL-25 mL   Drains None  Tourniquet time- 32 minutes @ 300 mm Hg  Complications- None  Condition-PACU - hemodynamically stable.   Brief Clinical Note  Stacy Boyd is a 65 y.o. year old female with end stage OA of her left knee with progressively worsening pain and dysfunction. She has constant pain, with activity and at rest and significant functional deficits with difficulties even with ADLs. She has had extensive non-op management including analgesics, injections of cortisone, and home exercise program, but remains in significant pain with significant dysfunction. Radiographs show bone on bone arthritis patellofemoral. She presents now for left Total Knee Arthroplasty.    Procedure in detail---   The patient is brought into the operating room and positioned supine on the operating table. After successful administration of  Adductor canal block and spinal,   a tourniquet is placed high on the  Left thigh(s) and the lower extremity is prepped and draped in the usual sterile fashion. Time out is performed by the operating team and then the  Left lower extremity is wrapped in Esmarch, knee flexed and the tourniquet inflated to 300 mmHg.       A midline incision is made with a ten blade through the subcutaneous tissue to the level of the extensor mechanism. A fresh blade is used to make a medial parapatellar arthrotomy. Soft tissue over the proximal medial tibia is subperiosteally elevated to the joint line with a knife and into the semimembranosus bursa with a Cobb elevator. Soft tissue over the proximal lateral tibia is elevated with attention being paid to avoiding the  patellar tendon on the tibial tubercle. The patella is everted, knee flexed 90 degrees and the ACL and PCL are removed. Findings are bone on bone lateral and patellofemoral with large global osteophytes.        The drill is used to create a starting hole in the distal femur and the canal is thoroughly irrigated with sterile saline to remove the fatty contents. The 5 degree Left  valgus alignment guide is placed into the femoral canal and the distal femoral cutting block is pinned to remove 9 mm off the distal femur. Resection is made with an oscillating saw.      The tibia is subluxed forward and the menisci are removed. The extramedullary alignment guide is placed referencing proximally at the medial aspect of the tibial tubercle and distally along the second metatarsal axis and tibial crest. The block is pinned to remove 50mm off the more deficient lateral  side. Resection is made with an oscillating saw. Size 5is the most appropriate size for the tibia and the proximal tibia is prepared with the modular drill and keel punch for that size.      The femoral sizing guide is placed and size 6 is most appropriate. Rotation is marked off the epicondylar axis and confirmed by creating a rectangular flexion gap at 90 degrees. The size 6 cutting block is pinned in this rotation and the anterior, posterior and chamfer cuts are made with the oscillating saw. The intercondylar block is then placed and that cut is made.      Trial size 5 tibial component, trial size 6 posterior stabilized femur and a 10  mm posterior stabilized rotating platform insert trial is placed. Full extension is achieved with excellent varus/valgus and anterior/posterior balance throughout full range of motion. The patella is everted and thickness measured to be 22  mm. Free hand resection is taken to 12 mm, a 35 template is placed, lug holes are drilled, trial patella is placed, and it tracks normally. Osteophytes are removed off the posterior  femur with the trial in place. All trials are removed and the cut bone surfaces prepared with pulsatile lavage. Cement is mixed and once ready for implantation, the size 5 tibial implant, size  6 posterior stabilized femoral component, and the size 35 patella are cemented in place and the patella is held with the clamp. The trial insert is placed and the knee held in full extension. The Exparel (20 ml mixed with 60 ml saline) is injected into the extensor mechanism, posterior capsule, medial and lateral gutters and subcutaneous tissues.  All extruded cement is removed and once the cement is hard the permanent 10 mm posterior stabilized rotating platform insert is placed into the tibial tray.      The wound is copiously irrigated with saline solution and the extensor mechanism closed with # 0 Stratofix suture. The tourniquet is released for a total tourniquet time of 32  minutes. Flexion against gravity is 140 degrees and the patella tracks normally. Subcutaneous tissue is closed with 2.0 vicryl and subcuticular with running 4.0 Monocryl. The incision is cleaned and dried and steri-strips and a bulky sterile dressing are applied. The limb is placed into a knee immobilizer and the patient is awakened and transported to recovery in stable condition.      Please note that a surgical assistant was a medical necessity for this procedure in order to perform it in a safe and expeditious manner. Surgical assistant was necessary to retract the ligaments and vital neurovascular structures to prevent injury to them and also necessary for proper positioning of the limb to allow for anatomic placement of the prosthesis.   Gus Rankin Brandi Tomlinson, MD    03/04/2020, 12:38 PM

## 2020-03-04 NOTE — Anesthesia Preprocedure Evaluation (Signed)
Anesthesia Evaluation  Patient identified by MRN, date of birth, ID band Patient awake    Reviewed: Allergy & Precautions, NPO status , Patient's Chart, lab work & pertinent test results  Airway Mallampati: II  TM Distance: >3 FB Neck ROM: Full    Dental  (+) Teeth Intact   Pulmonary neg pulmonary ROS,    Pulmonary exam normal        Cardiovascular negative cardio ROS   Rhythm:Regular Rate:Normal     Neuro/Psych Depression negative neurological ROS     GI/Hepatic Neg liver ROS, hiatal hernia, GERD  Medicated,  Endo/Other  Hypothyroidism   Renal/GU negative Renal ROS  negative genitourinary   Musculoskeletal  (+) Arthritis , Osteoarthritis,    Abdominal (+)  Abdomen: soft. Bowel sounds: normal.  Peds  Hematology  (+) anemia ,   Anesthesia Other Findings   Reproductive/Obstetrics                             Anesthesia Physical Anesthesia Plan  ASA: II  Anesthesia Plan: MAC, Regional and Spinal   Post-op Pain Management:  Regional for Post-op pain   Induction: Intravenous  PONV Risk Score and Plan: 2 and Ondansetron, Dexamethasone and Treatment may vary due to age or medical condition  Airway Management Planned: Simple Face Mask, Natural Airway and Nasal Cannula  Additional Equipment: None  Intra-op Plan:   Post-operative Plan: Extubation in OR  Informed Consent: I have reviewed the patients History and Physical, chart, labs and discussed the procedure including the risks, benefits and alternatives for the proposed anesthesia with the patient or authorized representative who has indicated his/her understanding and acceptance.     Dental advisory given  Plan Discussed with: CRNA  Anesthesia Plan Comments: (Lab Results      Component                Value               Date                      WBC                      5.6                 02/23/2020                HGB                       13.5                02/23/2020                HCT                      40.5                02/23/2020                MCV                      96.2                02/23/2020                PLT  339                 02/23/2020           Lab Results      Component                Value               Date                      NA                       138                 02/23/2020                K                        4.1                 02/23/2020                CO2                      26                  02/23/2020                GLUCOSE                  107 (H)             02/23/2020                BUN                      19                  02/23/2020                CREATININE               0.82                02/23/2020                CALCIUM                  9.3                 02/23/2020                GFRNONAA                 >60                 02/23/2020                GFRAA                    86                  10/11/2019          )        Anesthesia Quick Evaluation

## 2020-03-04 NOTE — Plan of Care (Signed)
  Problem: Education: Goal: Knowledge of General Education information will improve Description Including pain rating scale, medication(s)/side effects and non-pharmacologic comfort measures Outcome: Progressing   Problem: Clinical Measurements: Goal: Ability to maintain clinical measurements within normal limits will improve Outcome: Progressing   Problem: Activity: Goal: Risk for activity intolerance will decrease Outcome: Progressing   

## 2020-03-04 NOTE — Discharge Instructions (Signed)
 Frank Aluisio, MD Total Joint Specialist [facility] [address] [place], [place] [address] [phone]  TOTAL KNEE REPLACEMENT POSTOPERATIVE DIRECTIONS    Knee Rehabilitation, Guidelines Following Surgery  Results after knee surgery are often greatly improved when you follow the exercise, range of motion and muscle strengthening exercises prescribed by your doctor. Safety measures are also important to protect the knee from further injury. If any of these exercises cause you to have increased pain or swelling in your knee joint, decrease the amount until you are comfortable again and slowly increase them. If you have problems or questions, call your caregiver or physical therapist for advice.   BLOOD CLOT PREVENTION . Take a 325 mg Aspirin two times a day for three weeks following surgery. Then take an 81 mg Aspirin once a day for three weeks. Then discontinue Aspirin. . You may resume your vitamins/supplements upon discharge from the hospital. . Do not take any NSAIDs (Advil, Aleve, Ibuprofen, Meloxicam, etc.) until you have discontinued the 325 mg Aspirin.  HOME CARE INSTRUCTIONS  . Remove items at home which could result in a fall. This includes throw rugs or furniture in walking pathways.  . ICE to the affected knee as much as tolerated. Icing helps control swelling. If the swelling is well controlled you will be more comfortable and rehab easier. Continue to use ice on the knee for pain and swelling from surgery. You may notice swelling that will progress down to the foot and ankle. This is normal after surgery. Elevate the leg when you are not up walking on it.    . Continue to use the breathing machine which will help keep your temperature down. It is common for your temperature to cycle up and down following surgery, especially at night when you are not up moving around and exerting yourself. The breathing machine keeps your lungs expanded and your  temperature down. . Do not place pillow under the operative knee, focus on keeping the knee straight while resting  DIET You may resume your previous home diet once you are discharged from the hospital.  DRESSING / WOUND CARE / SHOWERING . Keep your bulky bandage on for 2 days. On the third post-operative day you may remove the Ace bandage and gauze. There is a waterproof adhesive bandage on your skin which will stay in place until your first follow-up appointment. Once you remove this you will not need to place another bandage . You may begin showering 3 days following surgery, but do not submerge the incision under water.  ACTIVITY For the first 5 days, the key is rest and control of pain and swelling . Do your home exercises twice a day starting on post-operative day 3. On the days you go to physical therapy, just do the home exercises once that day. . You should rest, ice and elevate the leg for 50 minutes out of every hour. Get up and walk/stretch for 10 minutes per hour. After 5 days you can increase your activity slowly as tolerated. . Walk with your walker as instructed. Use the walker until you are comfortable transitioning to a cane. Walk with the cane in the opposite hand of the operative leg. You may discontinue the cane once you are comfortable and walking steadily. . Avoid periods of inactivity such as sitting longer than an hour when not asleep. This helps prevent blood clots.  . You may discontinue the knee immobilizer once you are able to perform a straight   leg raise while lying down. . You may resume a sexual relationship in one month or when given the OK by your doctor.  . You may return to work once you are cleared by your doctor.  . Do not drive a car for 6 weeks or until released by your surgeon.  . Do not drive while taking narcotics.  TED HOSE STOCKINGS Wear the elastic stockings on both legs for three weeks following surgery during the day. You may remove them at night  for sleeping.  WEIGHT BEARING Weight bearing as tolerated with assist device (walker, cane, etc) as directed, use it as long as suggested by your surgeon or therapist, typically at least 4-6 weeks.  POSTOPERATIVE CONSTIPATION PROTOCOL Constipation - defined medically as fewer than three stools per week and severe constipation as less than one stool per week.  One of the most common issues patients have following surgery is constipation.  Even if you have a regular bowel pattern at home, your normal regimen is likely to be disrupted due to multiple reasons following surgery.  Combination of anesthesia, postoperative narcotics, change in appetite and fluid intake all can affect your bowels.  In order to avoid complications following surgery, here are some recommendations in order to help you during your recovery period.  . Colace (docusate) - Pick up an over-the-counter form of Colace or another stool softener and take twice a day as long as you are requiring postoperative pain medications.  Take with a full glass of water daily.  If you experience loose stools or diarrhea, hold the colace until you stool forms back up. If your symptoms do not get better within 1 week or if they get worse, check with your doctor. . Dulcolax (bisacodyl) - Pick up over-the-counter and take as directed by the product packaging as needed to assist with the movement of your bowels.  Take with a full glass of water.  Use this product as needed if not relieved by Colace only.  . MiraLax (polyethylene glycol) - Pick up over-the-counter to have on hand. MiraLax is a solution that will increase the amount of water in your bowels to assist with bowel movements.  Take as directed and can mix with a glass of water, juice, soda, coffee, or tea. Take if you go more than two days without a movement. Do not use MiraLax more than once per day. Call your doctor if you are still constipated or irregular after using this medication for 7 days  in a row.  If you continue to have problems with postoperative constipation, please contact the office for further assistance and recommendations.  If you experience "the worst abdominal pain ever" or develop nausea or vomiting, please contact the office immediatly for further recommendations for treatment.  ITCHING If you experience itching with your medications, try taking only a single pain pill, or even half a pain pill at a time.  You can also use Benadryl over the counter for itching or also to help with sleep.   MEDICATIONS See your medication summary on the "After Visit Summary" that the nursing staff will review with you prior to discharge.  You may have some home medications which will be placed on hold until you complete the course of blood thinner medication.  It is important for you to complete the blood thinner medication as prescribed by your surgeon.  Continue your approved medications as instructed at time of discharge.  PRECAUTIONS . If you experience chest pain or shortness of   breath - call 911 immediately for transfer to the hospital emergency department.  . If you develop a fever greater that 101 F, purulent drainage from wound, increased redness or drainage from wound, foul odor from the wound/dressing, or calf pain - CONTACT YOUR SURGEON.                                                   FOLLOW-UP APPOINTMENTS Make sure you keep all of your appointments after your operation with your surgeon and caregivers. You should call the office at the above phone number and make an appointment for approximately two weeks after the date of your surgery or on the date instructed by your surgeon outlined in the "After Visit Summary".  RANGE OF MOTION AND STRENGTHENING EXERCISES  Rehabilitation of the knee is important following a knee injury or an operation. After just a few days of immobilization, the muscles of the thigh which control the knee become weakened and shrink (atrophy). Knee  exercises are designed to build up the tone and strength of the thigh muscles and to improve knee motion. Often times heat used for twenty to thirty minutes before working out will loosen up your tissues and help with improving the range of motion but do not use heat for the first two weeks following surgery. These exercises can be done on a training (exercise) mat, on the floor, on a table or on a bed. Use what ever works the best and is most comfortable for you Knee exercises include:  . Leg Lifts - While your knee is still immobilized in a splint or cast, you can do straight leg raises. Lift the leg to 60 degrees, hold for 3 sec, and slowly lower the leg. Repeat 10-20 times 2-3 times daily. Perform this exercise against resistance later as your knee gets better.  . Quad and Hamstring Sets - Tighten up the muscle on the front of the thigh (Quad) and hold for 5-10 sec. Repeat this 10-20 times hourly. Hamstring sets are done by pushing the foot backward against an object and holding for 5-10 sec. Repeat as with quad sets.   Leg Slides: Lying on your back, slowly slide your foot toward your buttocks, bending your knee up off the floor (only go as far as is comfortable). Then slowly slide your foot back down until your leg is flat on the floor again.  Angel Wings: Lying on your back spread your legs to the side as far apart as you can without causing discomfort.  A rehabilitation program following serious knee injuries can speed recovery and prevent re-injury in the future due to weakened muscles. Contact your doctor or a physical therapist for more information on knee rehabilitation.   IF YOU ARE TRANSFERRED TO A SKILLED REHAB FACILITY If the patient is transferred to a skilled rehab facility following release from the hospital, a list of the current medications will be sent to the facility for the patient to continue.  When discharged from the skilled rehab facility, please have the facility set up the  patient's Home Health Physical Therapy prior to being released. Also, the skilled facility will be responsible for providing the patient with their medications at time of release from the facility to include their pain medication, the muscle relaxants, and their blood thinner medication. If the patient is still at the   rehab facility at time of the two week follow up appointment, the skilled rehab facility will also need to assist the patient in arranging follow up appointment in our office and any transportation needs.  MAKE SURE YOU:  . Understand these instructions.  . Get help right away if you are not doing well or get worse.   DENTAL ANTIBIOTICS:  In most cases prophylactic antibiotics for Dental procdeures after total joint surgery are not necessary.  Exceptions are as follows:  1. History of prior total joint infection  2. Severely immunocompromised (Organ Transplant, cancer chemotherapy, Rheumatoid biologic meds such as Humera)  3. Poorly controlled diabetes (A1C &gt; 8.0, blood glucose over 200)  If you have one of these conditions, contact your surgeon for an antibiotic prescription, prior to your dental procedure.    Pick up stool softner and laxative for home use following surgery while on pain medications. Do not submerge incision under water. Please use good hand washing techniques while changing dressing each day. May shower starting three days after surgery. Please use a clean towel to pat the incision dry following showers. Continue to use ice for pain and swelling after surgery. Do not use any lotions or creams on the incision until instructed by your surgeon.  

## 2020-03-04 NOTE — Interval H&P Note (Signed)
History and Physical Interval Note:  03/04/2020 9:19 AM  Stacy Boyd  has presented today for surgery, with the diagnosis of left knee osteoarthritis.  The various methods of treatment have been discussed with the patient and family. After consideration of risks, benefits and other options for treatment, the patient has consented to  Procedure(s) with comments: TOTAL KNEE ARTHROPLASTY (Left) - as a surgical intervention.  The patient's history has been reviewed, patient examined, no change in status, stable for surgery.  I have reviewed the patient's chart and labs.  Questions were answered to the patient's satisfaction.     Stacy Boyd [name]

## 2020-03-04 NOTE — Transfer of Care (Signed)
Immediate Anesthesia Transfer of Care Note  Patient: Stacy Boyd  Procedure(s) Performed: TOTAL KNEE ARTHROPLASTY (Left Knee)  Patient Location: PACU  Anesthesia Type:Spinal  Level of Consciousness: awake, alert  and patient cooperative  Airway & Oxygen Therapy: Patient Spontanous Breathing and Patient connected to face mask oxygen  Post-op Assessment: Report given to RN and Post -op Vital signs reviewed and stable  Post vital signs: Reviewed and stable  Last Vitals:  Vitals Value Taken Time  BP 145/89 03/04/20 1304  Temp    Pulse 74 03/04/20 1305  Resp 13 03/04/20 1305  SpO2 100 % 03/04/20 1305  Vitals shown include unvalidated device data.  Last Pain:  Vitals:   03/04/20 0919  TempSrc:   PainSc: 0-No pain         Complications: No complications documented.

## 2020-03-04 NOTE — Anesthesia Procedure Notes (Signed)
Spinal  Patient location during procedure: OR Start time: 03/04/2020 11:36 AM End time: 03/04/2020 11:38 AM Staffing Performed: anesthesiologist  Anesthesiologist: Atilano Median, DO Preanesthetic Checklist Completed: patient identified, IV checked, site marked, risks and benefits discussed, surgical consent, monitors and equipment checked, pre-op evaluation and timeout performed Spinal Block Patient position: sitting Prep: DuraPrep Patient monitoring: heart rate, cardiac monitor, continuous pulse ox and blood pressure Approach: midline Location: L3-4 Injection technique: single-shot Needle Needle type: Pencan  Needle gauge: 24 G Needle length: 10 cm Additional Notes Patient identified. Risks/Benefits/Options discussed with patient including but not limited to bleeding, infection, nerve damage, paralysis, failed block, incomplete pain control, headache, blood pressure changes, nausea, vomiting, reactions to medications, itching and postpartum back pain. Confirmed with bedside nurse the patient's most recent platelet count. Confirmed with patient that they are not currently taking any anticoagulation, have any bleeding history or any family history of bleeding disorders. Patient expressed understanding and wished to proceed. All questions were answered. Sterile technique was used throughout the entire procedure. Please see nursing notes for vital signs. Warning signs of high block given to the patient including shortness of breath, tingling/numbness in hands, complete motor block, or any concerning symptoms with instructions to call for help. Patient was given instructions on fall risk and not to get out of bed. All questions and concerns addressed with instructions to call with any issues or inadequate analgesia.

## 2020-03-04 NOTE — Anesthesia Procedure Notes (Signed)
Anesthesia Regional Block: Adductor canal block   Pre-Anesthetic Checklist: ,, timeout performed, Correct Patient, Correct Site, Correct Laterality, Correct Procedure, Correct Position, site marked, Risks and benefits discussed,  Surgical consent,  Pre-op evaluation,  At surgeon's request and post-op pain management  Laterality: Left  Prep: Dura Prep       Needles:  Injection technique: Single-shot  Needle Type: Echogenic Stimulator Needle     Needle Length: 9cm  Needle Gauge: 20     Additional Needles:   Procedures:,,,, ultrasound used (permanent image in chart),,,,  Narrative:  Start time: 03/04/2020 10:37 AM End time: 03/04/2020 10:40 AM Injection made incrementally with aspirations every 5 mL.  Performed by: Personally  Anesthesiologist: Atilano Median, DO  Additional Notes: Patient identified. Risks/Benefits/Options discussed with patient including but not limited to bleeding, infection, nerve damage, failed block, incomplete pain control. Patient expressed understanding and wished to proceed. All questions were answered. Sterile technique was used throughout the entire procedure. Please see nursing notes for vital signs. Aspirated in 5cc intervals with injection for negative confirmation. Patient was given instructions on fall risk and not to get out of bed. All questions and concerns addressed with instructions to call with any issues or inadequate analgesia.

## 2020-03-04 NOTE — Evaluation (Signed)
Physical Therapy Evaluation Patient Details Name: Stacy Boyd MRN: [id number] DOB: [birth year] Today's Date: 03/04/2020   History of Present Illness  patient is a 65 y.o. female s/p Lt TKA on 03/04/2020 with PMH significant for pre-diabetes, hypothyroid, GERD, depression, anemia, OA, and Rt TKA (2021).  Clinical Impression  Pt is a 65y.o. female s/p Lt TKA POD 0. Pt reports that she is independent with mobility at baseline. Pt was experiencing numbness/tingling of plantar surface of B feet and required MIN -MOD assist +2 for safety and steadying with sit to stand transfer and cues for safe hand placement. Pt required MIN assist +2 for stability with lateral stepping to recliner with no LOB or knee buckling. PT deferred further mobility today to maintain pt safety as pt was still experiencing effects from spinal block and required increased support from therapist to maintain stability. PT reviewed therapeutic intervention for promotion of DVT prevention, pt demonstrated understanding. Pt's roommate and daughter are available to assist at home. Pt will benefit from skilled PT to increase independence and safety with mobility. Acute therapy to follow up during stay.      Follow Up Recommendations Outpatient PT;Follow surgeon's recommendation for DC plan and follow-up therapies    Equipment Recommendations  None recommended by PT (pt owns RW)    Recommendations for Other Services       Precautions / Restrictions Precautions Precautions: Fall Restrictions Weight Bearing Restrictions: No Other Position/Activity Restrictions: WBAT      Mobility  Bed Mobility Overal bed mobility: Needs Assistance Bed Mobility: Supine to Sit     Supine to sit: Supervision;HOB elevated     General bed mobility comments: pt able to use B UEs and bedrail to scoot to EOB with supervision for safety    Transfers Overall transfer level: Needs assistance Equipment used: Rolling walker (2  wheeled) Transfers: Sit to/from [employer] Sit to Stand: Mod assist;+2 physical assistance;+2 safety/equipment;Min assist Stand pivot transfers: Min assist;+2 physical assistance;+2 safety/equipment       General transfer comment: pt performed pre gait marching with MIN assist +2 for stability with no knee buckling. Pt experienced decreased sensation in plantar surface of B feet and instability in standing 2/2 spinal block. Pt with MOD assist +1 for inital attempt to stand and uable to achieve full erect standing position. MIN assist +2 for power up and stability with sit to stand. Pt with MIN assist +2 for safety with cues to perform lateral stepping to recliner with no LOB.  Ambulation/Gait                Stairs            Wheelchair Mobility    Modified Rankin (Stroke Patients Only)       Balance Overall balance assessment: Needs assistance Sitting-balance support: Feet supported Sitting balance-Leahy Scale: Good     Standing balance support: Bilateral upper extremity supported;During functional activity Standing balance-Leahy Scale: Poor Standing balance comment: use of RW to maintain standing balance                             Pertinent Vitals/Pain Pain Assessment: 0-10 Pain Score: 5  Pain Location: Lt knee Pain Descriptors / Indicators: Sore;Discomfort;Tender Pain Intervention(s): Limited activity within patient's tolerance;Monitored during session;Repositioned;Ice applied    Home Living Family/patient expects to be discharged to:: Private residence Living Arrangements: Non-relatives/Friends Available Help at Discharge: Family;Friend(s) Type of Home: House Home Access: Stairs to enter  Entrance Stairs-Rails: None Entrance Stairs-Number of Steps: 2 Home Layout: One level Home Equipment: Walker - 2 wheels Additional Comments: pt's daughter is taking 1 week off of work to assist at home and her roommate is available to help.     Prior Function Level of Independence: Independent               Hand Dominance   Dominant Hand: Right    Extremity/Trunk Assessment   Upper Extremity Assessment Upper Extremity Assessment: Overall WFL for tasks assessed    Lower Extremity Assessment Lower Extremity Assessment: LLE deficits/detail LLE Deficits / Details: pt with good quad set strength and 4/5 B dorsi/plantar flexion strength. Pt able to complete full SLR with no extensor lag. LLE Sensation: decreased light touch (numbness in B plantar surface of feet) LLE Coordination: WNL    Cervical / Trunk Assessment Cervical / Trunk Assessment: Normal  Communication   Communication: No difficulties  Cognition Arousal/Alertness: Awake/alert Behavior During Therapy: WFL for tasks assessed/performed Overall Cognitive Status: Within Functional Limits for tasks assessed                                        General Comments      Exercises Total Joint Exercises Ankle Circles/Pumps: AROM;Both;15 reps;Seated   Assessment/Plan    PT Assessment Patient needs continued PT services  PT Problem List Decreased strength;Decreased range of motion;Decreased activity tolerance;Decreased balance;Decreased mobility;Decreased coordination;Decreased knowledge of use of DME;Pain       PT Treatment Interventions DME instruction;Gait training;Stair training;Functional mobility training;Therapeutic activities;Therapeutic exercise;Balance training;Patient/family education    PT Goals (Current goals can be found in the Care Plan section)  Acute Rehab PT Goals Patient Stated Goal: none stated PT Goal Formulation: With patient/family Time For Goal Achievement: 03/11/20 Potential to Achieve Goals: Good    Frequency 7X/week   Barriers to discharge        Co-evaluation               AM-PAC PT "6 Clicks" Mobility  Outcome Measure Help needed turning from your back to your side while in a flat bed  without using bedrails?: None Help needed moving from lying on your back to sitting on the side of a flat bed without using bedrails?: A Little Help needed moving to and from a bed to a chair (including a wheelchair)?: A Lot Help needed standing up from a chair using your arms (e.g., wheelchair or bedside chair)?: A Lot Help needed to walk in hospital room?: A Lot Help needed climbing 3-5 steps with a railing? : A Lot 6 Click Score: 15    End of Session Equipment Utilized During Treatment: Gait belt Activity Tolerance: Patient tolerated treatment well Patient left: in chair;with call bell/phone within reach;with chair alarm set;with family/visitor present Nurse Communication: Mobility status (+2 for return to chair) PT Visit Diagnosis: Unsteadiness on feet (R26.81);Muscle weakness (generalized) (M62.81);Pain Pain - Right/Left: Left Pain - part of body: Knee    Time: 4696-2952 PT Time Calculation (min) (ACUTE ONLY): 23 min   Charges:              Loyal Gambler, SPT  Acute rehab    Loyal Gambler 03/04/2020, 6:39 PM

## 2020-03-05 ENCOUNTER — Encounter (HOSPITAL_COMMUNITY): Payer: Self-pay | Admitting: Orthopedic Surgery

## 2020-03-05 DIAGNOSIS — M1712 Unilateral primary osteoarthritis, left knee: Secondary | ICD-10-CM | POA: Diagnosis not present

## 2020-03-05 LAB — BASIC METABOLIC PANEL
Anion gap: 9 (ref 5–15)
BUN: 10 mg/dL (ref 8–23)
CO2: 25 mmol/L (ref 22–32)
Calcium: 8.6 mg/dL — ABNORMAL LOW (ref 8.9–10.3)
Chloride: 107 mmol/L (ref 98–111)
Creatinine, Ser: 0.67 mg/dL (ref 0.44–1.00)
GFR, Estimated: >60 mL/min (ref >60)
Glucose, Bld: 127 mg/dL — ABNORMAL HIGH (ref 70–99)
Potassium: 4.1 mmol/L (ref 3.5–5.1)
Sodium: 141 mmol/L (ref 135–145)

## 2020-03-05 LAB — CBC
HCT: 35.5 % — ABNORMAL LOW (ref 36.0–46.0)
Hemoglobin: 12 g/dL (ref 12.0–15.0)
MCH: 32.4 pg (ref 26.0–34.0)
MCHC: 33.8 g/dL (ref 30.0–36.0)
MCV: 95.9 fL (ref 80.0–100.0)
Platelets: 343 10*3/uL (ref 150–400)
RBC: 3.7 MIL/uL — ABNORMAL LOW (ref 3.87–5.11)
RDW: 12.5 % (ref 11.5–15.5)
WBC: 10.4 10*3/uL (ref 4.0–10.5)
nRBC: 0 % (ref 0.0–0.2)

## 2020-03-05 MED ORDER — OXYCODONE HCL 5 MG PO TABS: 5.0000 mg | ORAL_TABLET | Freq: Four times a day (QID) | ORAL | 0 refills | Status: DC | PRN

## 2020-03-05 MED ORDER — METHOCARBAMOL 500 MG PO TABS: 500.0000 mg | ORAL_TABLET | Freq: Four times a day (QID) | ORAL | 0 refills | Status: DC | PRN

## 2020-03-05 MED ORDER — GABAPENTIN 300 MG PO CAPS: ORAL_CAPSULE | ORAL | 0 refills | Status: DC

## 2020-03-05 MED ORDER — TRAMADOL HCL 50 MG PO TABS: 50.0000 mg | ORAL_TABLET | Freq: Four times a day (QID) | ORAL | 0 refills | Status: DC | PRN

## 2020-03-05 MED ORDER — ASPIRIN 325 MG PO TBEC: 325.0000 mg | DELAYED_RELEASE_TABLET | Freq: Two times a day (BID) | ORAL | 0 refills | Status: DC

## 2020-03-05 NOTE — TOC Transition Note (Signed)
Transition of Care [facility]) - CM/SW Discharge Note   Patient Details  Name: Stacy Boyd MRN: [id number] Date of Birth: 02/13/1955  [facility] CM/SW Contact:  Lennart Pall, LCSW Phone Number: 03/05/2020, 10:03 AM   Clinical Narrative:    Met with pt and confirmed she has all needed DME at home.  Plan for OPPT at [facility]).  No further TOC needs.   Final next level of care: OP Rehab Barriers to Discharge: No Barriers Identified   Patient Goals and CMS Choice Patient states their goals for this hospitalization and ongoing recovery are:: return home      Discharge Placement                       Discharge Plan and Services                DME Arranged: N/A DME Agency: NA                  Social Determinants of Health (SDOH) Interventions     Readmission Risk Interventions No flowsheet data found.

## 2020-03-05 NOTE — Plan of Care (Signed)
  Problem: Education: Goal: Knowledge of General Education information will improve Description: Including pain rating scale, medication(s)/side effects and non-pharmacologic comfort measures Outcome: Adequate for Discharge   Problem: Health Behavior/Discharge Planning: Goal: Ability to manage health-related needs will improve Outcome: Adequate for Discharge   Problem: Clinical Measurements: Goal: Ability to maintain clinical measurements within normal limits will improve Outcome: Adequate for Discharge Goal: Will remain free from infection Outcome: Adequate for Discharge Goal: Diagnostic test results will improve Outcome: Adequate for Discharge Goal: Respiratory complications will improve Outcome: Adequate for Discharge Goal: Cardiovascular complication will be avoided Outcome: Adequate for Discharge   Problem: Activity: Goal: Risk for activity intolerance will decrease Outcome: Adequate for Discharge   Problem: Nutrition: Goal: Adequate nutrition will be maintained Outcome: Adequate for Discharge   Problem: Coping: Goal: Level of anxiety will decrease Outcome: Adequate for Discharge   Problem: Elimination: Goal: Will not experience complications related to bowel motility Outcome: Adequate for Discharge Goal: Will not experience complications related to urinary retention Outcome: Adequate for Discharge   Problem: Pain Managment: Goal: General experience of comfort will improve Outcome: Adequate for Discharge   Problem: Safety: Goal: Ability to remain free from injury will improve Outcome: Adequate for Discharge   Problem: Skin Integrity: Goal: Risk for impaired skin integrity will decrease Outcome: Adequate for Discharge   Problem: Acute Rehab PT Goals(only PT should resolve) Goal: Patient Will Transfer Sit To/From Stand Outcome: Adequate for Discharge Goal: Pt Will Ambulate Outcome: Adequate for Discharge Goal: Pt Will Go Up/Down Stairs Outcome: Adequate for  Discharge   

## 2020-03-05 NOTE — Plan of Care (Signed)

## 2020-03-05 NOTE — Progress Notes (Signed)
   Subjective: 1 Day Post-Op Procedure(s) (LRB): TOTAL KNEE ARTHROPLASTY (Left) Patient reports pain as mild.   Patient seen in rounds by Dr. Lequita Halt. Patient is well, and has had no acute complaints or problems. No acute overnight events. Denies SOB, chest pain, or calf pain. Foley pulled this am.  We will begin therapy today.   Objective: Vital signs in last 24 hours: Temp:  [95.7 F (35.4 C)-98.2 F (36.8 C)] 97.6 F (36.4 C) (02/22 0925) Pulse Rate:  [49-84] 66 (02/22 0925) Resp:  [11-22] 15 (02/22 0925) BP: (118-182)/(50-94) 128/50 (02/22 0925) SpO2:  [96 %-100 %] 100 % (02/22 0925) Weight:  [82.1 kg] 82.1 kg (02/21 1452)  Intake/Output from previous day:  Intake/Output Summary (Last 24 hours) at 03/05/2020 0949 Last data filed at 03/05/2020 0700 Gross per 24 hour  Intake 4083.32 ml  Output 3125 ml  Net 958.32 ml     Intake/Output this shift: No intake/output data recorded.  Labs: Recent Labs    03/05/20 0315  HGB 12.0   Recent Labs    03/05/20 0315  WBC 10.4  RBC 3.70*  HCT 35.5*  PLT 343   Recent Labs    03/05/20 0315  NA 141  K 4.1  CL 107  CO2 25  BUN 10  CREATININE 0.67  GLUCOSE 127*  CALCIUM 8.6*   No results for input(s): LABPT, INR in the last 72 hours.  Exam: General - Patient is Alert, Appropriate and Oriented Extremity - Neurologically intact Neurovascular intact Sensation intact distally Intact pulses distally Dorsiflexion/Plantar flexion intact Dressing - dressing C/D/I Motor Function - intact, moving foot and toes well on exam.   Past Medical History:  Diagnosis Date  . Anemia   . Arthritis   . Depression    Currently not being treated  . Dry eye   . GERD (gastroesophageal reflux disease)   . H. pylori infection   . History of hiatal hernia   . Hypothyroid   . Pneumonia   . Pre-diabetes     Assessment/Plan: 1 Day Post-Op Procedure(s) (LRB): TOTAL KNEE ARTHROPLASTY (Left) Active Problems:   Primary  osteoarthritis of left knee  Estimated body mass index is 29.21 kg/m as calculated from the following:   Height as of this encounter: 5\' 6"  (1.676 m).   Weight as of this encounter: 82.1 kg. Advance diet Up with therapy   Patient's anticipated LOS is less than 2 midnights, meeting these requirements: - Lives within 1 hour of care - Has a competent adult at home to recover with post-op recover - NO history of  - Chronic pain requiring opiods  - Diabetes  - Coronary Artery Disease  - Heart failure  - Heart attack  - Stroke  - DVT/VTE  - Cardiac arrhythmia  - Respiratory Failure/COPD  - Renal failure  - Anemia  - Advanced Liver disease       DVT Prophylaxis - Aspirin Weight bearing as tolerated. Two physical therapy sessions today -- will plan for discharge today if meeting goals. .  Plan is to go Home after hospital stay.  The PDMP database was reviewed today prior to any opioid medications being prescribed to this patient.  Patient to follow up in clinic with Dr. in two weeks.   Lequita Halt, MBA, PA-C Orthopedic Surgery 03/05/2020, 9:49 AM

## 2020-03-05 NOTE — Progress Notes (Signed)
Physical Therapy Treatment Patient Details Name: Stacy Boyd MRN: [id number] DOB: [birth year] Today's Date: 03/05/2020    History of Present Illness Patient is a 65 y.o. female s/p Lt TKA on 03/04/2020 with PMH significant for pre-diabetes, hypothyroid, GERD, depression, anemia, OA, and Rt TKA (2021).    PT Comments    Pt progressing well today. Pt and dtr known to me from prior knee surgery a few mos ago. Reports her R knee is doing great. Strength in LLE looks good also, pt able to perform independent SLRs with ~3 degree quad lag.   Follow Up Recommendations  Outpatient PT;Follow surgeon's recommendation for DC plan and follow-up therapies     Equipment Recommendations  None recommended by PT    Recommendations for Other Services       Precautions / Restrictions Precautions Precautions: Fall;Knee Restrictions Weight Bearing Restrictions: No LLE Weight Bearing: Weight bearing as tolerated Other Position/Activity Restrictions: WBAT    Mobility  Bed Mobility Overal bed mobility: Modified Independent                  Transfers Overall transfer level: Needs assistance Equipment used: Rolling walker (2 wheeled) Transfers: Sit to/from Stand Sit to Stand: Supervision;Modified independent (Device/Increase time)         General transfer comment: pt self cues hand placement and LE position  Ambulation/Gait Ambulation/Gait assistance: Supervision;Modified independent (Device/Increase time) Gait Distance (Feet): 130 Feet (10' more) Assistive device: Rolling walker (2 wheeled) Gait Pattern/deviations: Step-to pattern;Decreased weight shift to left     General Gait Details: cues for sequence, posture   Stairs             Wheelchair Mobility    Modified Rankin (Stroke Patients Only)       Balance             Standing balance-Leahy Scale: Fair Standing balance comment: static stand without UE support                             Cognition Arousal/Alertness: Awake/alert Behavior During Therapy: WFL for tasks assessed/performed Overall Cognitive Status: Within Functional Limits for tasks assessed                                        Exercises      General Comments General comments (skin integrity, edema, etc.): given handout for exercises to do until OPPT appt. pt familiar from recent knee surgery RLE      Pertinent Vitals/Pain Pain Assessment: Faces Faces Pain Scale: Hurts a little bit Pain Location: Lt knee Pain Descriptors / Indicators: Sore;Discomfort;Tender Pain Intervention(s): Limited activity within patient's tolerance;Monitored during session;Premedicated before session;Repositioned    Home Living                      Prior Function            PT Goals (current goals can now be found in the care plan section) Acute Rehab PT Goals Patient Stated Goal: none stated PT Goal Formulation: With patient/family Time For Goal Achievement: 03/11/20 Potential to Achieve Goals: Good Progress towards PT goals: Progressing toward goals    Frequency    7X/week      PT Plan Current plan remains appropriate    Co-evaluation              AM-PAC PT "  6 Clicks" Mobility   Outcome Measure  Help needed turning from your back to your side while in a flat bed without using bedrails?: None Help needed moving from lying on your back to sitting on the side of a flat bed without using bedrails?: None Help needed moving to and from a bed to a chair (including a wheelchair)?: None Help needed standing up from a chair using your arms (e.g., wheelchair or bedside chair)?: None Help needed to walk in hospital room?: A Little Help needed climbing 3-5 steps with a railing? : A Little 6 Click Score: 22    End of Session Equipment Utilized During Treatment: Gait belt Activity Tolerance: Patient tolerated treatment well Patient left: in chair;with call bell/phone within  reach;with chair alarm set;with family/visitor present Nurse Communication: Mobility status PT Visit Diagnosis: Unsteadiness on feet (R26.81);Muscle weakness (generalized) (M62.81);Pain Pain - Right/Left: Left Pain - part of body: Knee     Time: 8453-6468 PT Time Calculation (min) (ACUTE ONLY): 22 min  Charges:  $Gait Training: 8-22 mins                     Delice Bison, PT  Acute Rehab Dept ([facility]) [phone] Pager [phone]  03/05/2020    [facility] 03/05/2020, 11:21 AM

## 2020-03-07 DIAGNOSIS — Z471 Aftercare following joint replacement surgery: Secondary | ICD-10-CM | POA: Diagnosis not present

## 2020-03-08 DIAGNOSIS — Z471 Aftercare following joint replacement surgery: Secondary | ICD-10-CM | POA: Diagnosis not present

## 2020-03-11 DIAGNOSIS — Z471 Aftercare following joint replacement surgery: Secondary | ICD-10-CM | POA: Diagnosis not present

## 2020-03-11 NOTE — Discharge Summary (Signed)
Physician Discharge Summary   Patient ID: Stacy Boyd MRN: [id number] DOB/AGE: 09/05/1955 65 y.o.  Admit date: 03/04/2020 Discharge date: 03/05/2020  Primary Diagnosis: Osteoarthritis of Left Knee  Admission Diagnoses:  Past Medical History:  Diagnosis Date  . Anemia   . Arthritis   . Depression    Currently not being treated  . Dry eye   . GERD (gastroesophageal reflux disease)   . H. pylori infection   . History of hiatal hernia   . Hypothyroid   . Pneumonia   . Pre-diabetes    Discharge Diagnoses:   Active Problems:   Primary osteoarthritis of left knee  Estimated body mass index is 29.21 kg/m as calculated from the following:   Height as of this encounter: 5\' 6"  (1.676 m).   Weight as of this encounter: 82.1 kg.  Procedure:  Procedure(s) (LRB): TOTAL KNEE ARTHROPLASTY (Left)   Consults: None  HPI: Stacy Boyd is a 65 y.o. year old female with end stage OA of her left knee with progressively worsening pain and dysfunction. She has constant pain, with activity and at rest and significant functional deficits with difficulties even with ADLs. She has had extensive non-op management including analgesics, injections of cortisone, and home exercise program, but remains in significant pain with significant dysfunction. Radiographs show bone on bone arthritis patellofemoral.  Laboratory Data: Admission on 03/04/2020, Discharged on 03/05/2020  Component Date Value Ref Range Status  . WBC 03/05/2020 10.4  4.0 - 10.5 K/uL Final  . RBC 03/05/2020 3.70* 3.87 - 5.11 MIL/uL Final  . Hemoglobin 03/05/2020 12.0  12.0 - 15.0 g/dL Final  . HCT 03/07/2020 35.5* 36.0 - 46.0 % Final  . MCV 03/05/2020 95.9  80.0 - 100.0 fL Final  . MCH 03/05/2020 32.4  26.0 - 34.0 pg Final  . MCHC 03/05/2020 33.8  30.0 - 36.0 g/dL Final  . RDW 03/07/2020 12.5  11.5 - 15.5 % Final  . Platelets 03/05/2020 343  150 - 400 K/uL Final  . nRBC 03/05/2020 0.0  0.0 - 0.2 % Final   Performed at  [facility], [address] [place], [place]  . Sodium 03/05/2020 141  135 - 145 mmol/L Final  . Potassium 03/05/2020 4.1  3.5 - 5.1 mmol/L Final  . Chloride 03/05/2020 107  98 - 111 mmol/L Final  . CO2 03/05/2020 25  22 - 32 mmol/L Final  . Glucose, Bld 03/05/2020 127* 70 - 99 mg/dL Final   Glucose reference range applies only to samples taken after fasting for at least 8 hours.  . BUN 03/05/2020 10  8 - 23 mg/dL Final  . Creatinine, Ser 03/05/2020 0.67  0.44 - 1.00 mg/dL Final  . Calcium 03/07/2020 8.6* 8.9 - 10.3 mg/dL Final  . GFR, Estimated 03/05/2020 >60  >60 mL/min Final   Comment: (NOTE) Calculated using the CKD-EPI Creatinine Equation (2021)   . Anion gap 03/05/2020 9  5 - 15 Final   Performed at [facility], [address] [place], [place]  Hospital Outpatient Visit on 02/29/2020  Component Date Value Ref Range Status  . SARS Coronavirus 2 02/29/2020 NEGATIVE  NEGATIVE Final   Comment: (NOTE) SARS-CoV-2 target nucleic acids are NOT DETECTED.  The SARS-CoV-2 RNA is generally detectable in upper and lower respiratory specimens during the acute phase of infection. Negative results do not preclude SARS-CoV-2 infection, do not rule out co-infections with other pathogens, and should not be used as the sole basis for  treatment or other patient management decisions. Negative results must be combined with clinical observations, patient history, and epidemiological information. The expected result is Negative.  Fact Sheet for Patients: HairSlick.no  Fact Sheet for Healthcare Providers: quierodirigir.com  This test is not yet approved or cleared by the [place] FDA and  has been authorized for detection and/or diagnosis of SARS-CoV-2 by FDA under an Emergency Use Authorization (EUA). This EUA will remain  in effect (meaning this test can be used) for the  duration of the COVID-19 declaration under Se                          ction 564(b)(1) of the Act, 21 U.S.C. section 360bbb-3(b)(1), unless the authorization is terminated or revoked sooner.  Performed at [facility], [address], [place], [place] [address]   Hospital Outpatient Visit on 02/23/2020  Component Date Value Ref Range Status  . MRSA, PCR 02/23/2020 NEGATIVE  NEGATIVE Final  . Staphylococcus aureus 02/23/2020 NEGATIVE  NEGATIVE Final   Comment: (NOTE) The Xpert SA Assay (FDA approved for NASAL specimens in patients 79 years of age and older), is one component of a comprehensive surveillance program. It is not intended to diagnose infection nor to guide or monitor treatment. Performed at [facility], [address] [place], [place] [address]   . WBC 02/23/2020 5.6  4.0 - 10.5 K/uL Final  . RBC 02/23/2020 4.21  3.87 - 5.11 MIL/uL Final  . Hemoglobin 02/23/2020 13.5  12.0 - 15.0 g/dL Final  . HCT 09/81/1914 40.5  36.0 - 46.0 % Final  . MCV 02/23/2020 96.2  80.0 - 100.0 fL Final  . MCH 02/23/2020 32.1  26.0 - 34.0 pg Final  . MCHC 02/23/2020 33.3  30.0 - 36.0 g/dL Final  . RDW 78/29/5621 12.7  11.5 - 15.5 % Final  . Platelets 02/23/2020 339  150 - 400 K/uL Final  . nRBC 02/23/2020 0.0  0.0 - 0.2 % Final   Performed at [facility], [address], [place], [place] [address]  . Sodium 02/23/2020 138  135 - 145 mmol/L Final  . Potassium 02/23/2020 4.1  3.5 - 5.1 mmol/L Final  . Chloride 02/23/2020 102  98 - 111 mmol/L Final  . CO2 02/23/2020 26  22 - 32 mmol/L Final  . Glucose, Bld 02/23/2020 107* 70 - 99 mg/dL Final   Glucose reference range applies only to samples taken after fasting for at least 8 hours.  . BUN 02/23/2020 19  8 - 23 mg/dL Final  . Creatinine, Ser 02/23/2020 0.82  0.44 - 1.00 mg/dL Final  . Calcium 78/46/9629 9.3  8.9 - 10.3 mg/dL Final  . Total Protein 02/23/2020 7.1  6.5 - 8.1 g/dL Final  . Albumin  52/84/1324 4.3  3.5 - 5.0 g/dL Final  . AST 40/10/2723 22  15 - 41 U/L Final  . ALT 02/23/2020 20  0 - 44 U/L Final  . Alkaline Phosphatase 02/23/2020 59  38 - 126 U/L Final  . Total Bilirubin 02/23/2020 0.6  0.3 - 1.2 mg/dL Final  . GFR, Estimated 02/23/2020 >60  >60 mL/min Final   Comment: (NOTE) Calculated using the CKD-EPI Creatinine Equation (2021)   . Anion gap 02/23/2020 10  5 - 15 Final   Performed at [facility], [address], [place], [place] [address]  . Prothrombin Time 02/23/2020 11.8  11.4 - 15.2 seconds Final  .  INR 02/23/2020 0.9  0.8 - 1.2 Final   Comment: (NOTE) INR goal varies based on device and disease states. Performed at [facility], [address] [place], [place] [address]   . aPTT 02/23/2020 33  24 - 36 seconds Final   Performed at [facility], [address] [place], [place] [address]  . ABO/RH(D) 02/23/2020 B POS   Final  . Antibody Screen 02/23/2020 NEG   Final  . Sample Expiration 02/23/2020 03/07/2020,2359   Final  . Extend sample reason 02/23/2020    Final                   Value:NO TRANSFUSIONS OR PREGNANCY IN THE PAST 3 MONTHS Performed at [facility], [address] [place], [place] [address]   . Hgb A1c MFr Bld 02/23/2020 5.5  4.8 - 5.6 % Final   Comment: (NOTE) Pre diabetes:          5.7%-6.4%  Diabetes:              >6.4%  Glycemic control for   <7.0% adults with diabetes   . Mean Plasma Glucose 02/23/2020 111.15  mg/dL Final   Performed at [facility], [address] [place], [place] [address]     X-Rays:No results found.  EKG:No orders found for this or any previous visit.   Hospital Course: Stacy Boyd is a 65 y.o. who was admitted to [facility]. They were brought to the operating room on 03/04/2020 and underwent Procedure(s): TOTAL KNEE ARTHROPLASTY.  Patient tolerated the procedure well and was later transferred to the  recovery room and then to the orthopaedic floor for postoperative care. They were given PO and IV analgesics for pain control following their surgery. They were given 24 hours of postoperative antibiotics of  Anti-infectives (From admission, onward)   Start     Dose/Rate Route Frequency Ordered Stop   03/04/20 1730  ceFAZolin (ANCEF) IVPB 2g/100 mL premix        2 g 200 mL/hr over 30 Minutes Intravenous Every 6 hours 03/04/20 1418 03/05/20 0007   03/04/20 0915  ceFAZolin (ANCEF) IVPB 2g/100 mL premix        2 g 200 mL/hr over 30 Minutes Intravenous On call to O.R. 03/04/20 2956 03/04/20 1130     and started on DVT prophylaxis in the form of Aspirin.   PT and OT were ordered for total joint protocol. Discharge planning consulted to help with postop disposition and equipment needs.  Patient had an uneventful night on the evening of surgery. They started to get up OOB with therapy on 03/04/20. Pt was seen during rounds and was ready to go home pending progress with therapy. She worked with therapy on POD #1 and was meeting goals. Pt was discharged to home later that day in stable condition.  Diet: Regular diet Activity: WBAT Follow-up: in two weeks Disposition: Home Discharged Condition: good   Discharge Instructions    Call MD / Call 911   Complete by: As directed    If you experience chest pain or shortness of breath, CALL 911 and be transported to the hospital emergency room.  If you develope a fever above 101 F, pus (white drainage) or increased drainage or redness at the wound, or calf pain, call your surgeon's office.   Change dressing   Complete by: As directed    You may remove the bulky bandage (ACE wrap and gauze)  two days after surgery. You will have an adhesive waterproof bandage underneath. Leave this in place until your first follow-up appointment.   Constipation Prevention   Complete by: As directed    Drink plenty of fluids.  Prune juice may be helpful.  You may use a stool  softener, such as Colace (over the counter) 100 mg twice a day.  Use MiraLax (over the counter) for constipation as needed.   Diet - low sodium heart healthy   Complete by: As directed    Do not put a pillow under the knee. Place it under the heel.   Complete by: As directed    Driving restrictions   Complete by: As directed    No driving for two weeks   TED hose   Complete by: As directed    Use stockings (TED hose) for three weeks on both leg(s).  You may remove them at night for sleeping.   Weight bearing as tolerated   Complete by: As directed      Allergies as of 03/05/2020      Reactions   Erythromycin Hives, Nausea Only      Medication List    STOP taking these medications   acetaminophen 650 MG CR tablet Commonly known as: TYLENOL   diclofenac Sodium 1 % Gel Commonly known as: VOLTAREN   ibuprofen 200 MG tablet Commonly known as: ADVIL   UNABLE TO FIND     TAKE these medications   aspirin 325 MG EC tablet Take 1 tablet (325 mg total) by mouth 2 (two) times daily. Then take one 81 mg aspirin once a day for three weeks. Then discontinue aspirin.   CENTRUM SILVER 50+WOMEN PO Take 1 tablet daily by mouth.   Cequa 0.09 % Soln Generic drug: cycloSPORINE (PF) Place 1 drop into both eyes 2 (two) times daily.   cetirizine 10 MG tablet Commonly known as: ZYRTEC Take 10 mg by mouth daily as needed for allergies.   Citracal Calcium Gummies 250-115-250 MG-MG-UNIT Chew Generic drug: Calcium-Phosphorus-Vitamin D Chew 1 tablet by mouth daily.   famotidine-calcium carbonate-magnesium hydroxide 10-800-165 MG chewable tablet Commonly known as: PEPCID COMPLETE Chew 1 tablet by mouth at bedtime. Chewable   Fish Oil Ultra 1400 MG Caps Take 1,400 mg by mouth daily.   gabapentin 300 MG capsule Commonly known as: NEURONTIN Take a 300 mg capsule three times a day for two weeks following surgery.Then take a 300 mg capsule two times a day for two weeks. Then take a 300 mg  capsule once a day for two weeks. Then discontinue.   Melatonin 2.5 MG Caps Take 2.5 mg by mouth at bedtime.   methocarbamol 500 MG tablet Commonly known as: ROBAXIN Take 1 tablet (500 mg total) by mouth every 6 (six) hours as needed for muscle spasms.   oxyCODONE 5 MG immediate release tablet Commonly known as: Oxy IR/ROXICODONE Take 1-2 tablets (5-10 mg total) by mouth every 6 (six) hours as needed for severe pain.   Polyethyl Glycol-Propyl Glycol 0.4-0.3 % Soln Place 1 drop into both eyes 4 (four) times daily as needed (Dry eyes).   SPIRULINA PO Take 3,000 mg by mouth daily.   testosterone cypionate 100 MG/ML injection Commonly known as: DEPOTESTOTERONE CYPIONATE Inject 75 mg into the muscle See admin instructions. Every 12 weeks   THERATEARS STERILID CLEANSER EX Apply 1 application topically 2 (two) times daily.   traMADol 50 MG tablet Commonly known as: ULTRAM Take 1-2 tablets (50-100 mg total) by mouth  every 6 (six) hours as needed for moderate pain.   TURMERIC PO Take 2,000 mg by mouth daily.   Vitamin D-3 125 MCG (5000 UT) Tabs Take 5,000 Units by mouth daily.            Discharge Care Instructions  (From admission, onward)         Start     Ordered   03/05/20 0000  Weight bearing as tolerated        03/05/20 0958   03/05/20 0000  Change dressing       Comments: You may remove the bulky bandage (ACE wrap and gauze) two days after surgery. You will have an adhesive waterproof bandage underneath. Leave this in place until your first follow-up appointment.   03/05/20 9937          Follow-up Information    Ollen Gross, MD. Schedule an appointment as soon as possible for a visit on 03/19/2020.   Specialty: Orthopedic Surgery Contact information: [address] [place] [address] [place] [address] [phone]               Signed: Nelia Shi, MBA, PA-C Orthopedic Surgery 03/11/2020, 7:54 AM

## 2020-03-13 DIAGNOSIS — Z471 Aftercare following joint replacement surgery: Secondary | ICD-10-CM | POA: Diagnosis not present

## 2020-03-15 DIAGNOSIS — Z471 Aftercare following joint replacement surgery: Secondary | ICD-10-CM | POA: Diagnosis not present

## 2020-03-18 DIAGNOSIS — Z471 Aftercare following joint replacement surgery: Secondary | ICD-10-CM | POA: Diagnosis not present

## 2020-03-20 DIAGNOSIS — Z471 Aftercare following joint replacement surgery: Secondary | ICD-10-CM | POA: Diagnosis not present

## 2020-03-22 DIAGNOSIS — Z471 Aftercare following joint replacement surgery: Secondary | ICD-10-CM | POA: Diagnosis not present

## 2020-03-25 DIAGNOSIS — Z471 Aftercare following joint replacement surgery: Secondary | ICD-10-CM | POA: Diagnosis not present

## 2020-03-27 DIAGNOSIS — Z471 Aftercare following joint replacement surgery: Secondary | ICD-10-CM | POA: Diagnosis not present

## 2020-03-29 DIAGNOSIS — Z471 Aftercare following joint replacement surgery: Secondary | ICD-10-CM | POA: Diagnosis not present

## 2020-04-01 DIAGNOSIS — Z471 Aftercare following joint replacement surgery: Secondary | ICD-10-CM | POA: Diagnosis not present

## 2020-04-05 DIAGNOSIS — Z471 Aftercare following joint replacement surgery: Secondary | ICD-10-CM | POA: Diagnosis not present

## 2020-04-08 DIAGNOSIS — Z471 Aftercare following joint replacement surgery: Secondary | ICD-10-CM | POA: Diagnosis not present

## 2020-04-09 DIAGNOSIS — Z471 Aftercare following joint replacement surgery: Secondary | ICD-10-CM | POA: Diagnosis not present

## 2020-04-09 DIAGNOSIS — Z96652 Presence of left artificial knee joint: Secondary | ICD-10-CM | POA: Diagnosis not present

## 2020-04-12 DIAGNOSIS — Z471 Aftercare following joint replacement surgery: Secondary | ICD-10-CM | POA: Diagnosis not present

## 2020-04-15 DIAGNOSIS — Z6828 Body mass index (BMI) 28.0-28.9, adult: Secondary | ICD-10-CM | POA: Diagnosis not present

## 2020-04-15 DIAGNOSIS — E039 Hypothyroidism, unspecified: Secondary | ICD-10-CM | POA: Diagnosis not present

## 2020-04-15 DIAGNOSIS — R7989 Other specified abnormal findings of blood chemistry: Secondary | ICD-10-CM | POA: Diagnosis not present

## 2020-04-15 DIAGNOSIS — Z01419 Encounter for gynecological examination (general) (routine) without abnormal findings: Secondary | ICD-10-CM | POA: Diagnosis not present

## 2020-04-16 DIAGNOSIS — Z471 Aftercare following joint replacement surgery: Secondary | ICD-10-CM | POA: Diagnosis not present

## 2020-04-19 DIAGNOSIS — Z471 Aftercare following joint replacement surgery: Secondary | ICD-10-CM | POA: Diagnosis not present

## 2020-04-24 DIAGNOSIS — Z471 Aftercare following joint replacement surgery: Secondary | ICD-10-CM | POA: Diagnosis not present

## 2020-04-26 DIAGNOSIS — Z471 Aftercare following joint replacement surgery: Secondary | ICD-10-CM | POA: Diagnosis not present

## 2020-04-29 DIAGNOSIS — Z471 Aftercare following joint replacement surgery: Secondary | ICD-10-CM | POA: Diagnosis not present

## 2020-05-03 DIAGNOSIS — Z471 Aftercare following joint replacement surgery: Secondary | ICD-10-CM | POA: Diagnosis not present

## 2020-05-06 DIAGNOSIS — Z471 Aftercare following joint replacement surgery: Secondary | ICD-10-CM | POA: Diagnosis not present

## 2020-05-15 DIAGNOSIS — Z471 Aftercare following joint replacement surgery: Secondary | ICD-10-CM | POA: Diagnosis not present

## 2020-05-22 DIAGNOSIS — R6882 Decreased libido: Secondary | ICD-10-CM | POA: Diagnosis not present

## 2020-05-22 DIAGNOSIS — Z1231 Encounter for screening mammogram for malignant neoplasm of breast: Secondary | ICD-10-CM | POA: Diagnosis not present

## 2020-05-22 LAB — HM MAMMOGRAPHY

## 2020-07-18 DIAGNOSIS — R6882 Decreased libido: Secondary | ICD-10-CM | POA: Diagnosis not present

## 2020-08-29 ENCOUNTER — Ambulatory Visit
Admission: RE | Admit: 2020-08-29 | Discharge: 2020-08-29 | Disposition: A | Discharge status: Home / Self Care | Payer: PPO | Source: Ambulatory Visit | Attending: Sports Medicine | Admitting: Sports Medicine

## 2020-08-29 ENCOUNTER — Other Ambulatory Visit: Payer: Self-pay

## 2020-08-29 ENCOUNTER — Other Ambulatory Visit: Payer: Self-pay | Admitting: Sports Medicine

## 2020-08-29 DIAGNOSIS — M25511 Pain in right shoulder: Secondary | ICD-10-CM

## 2020-09-04 DIAGNOSIS — R6882 Decreased libido: Secondary | ICD-10-CM | POA: Diagnosis not present

## 2020-09-10 DIAGNOSIS — R6882 Decreased libido: Secondary | ICD-10-CM | POA: Diagnosis not present

## 2020-10-15 ENCOUNTER — Other Ambulatory Visit: Payer: Self-pay

## 2020-10-15 ENCOUNTER — Encounter: Payer: Self-pay | Admitting: Physician Assistant

## 2020-10-15 ENCOUNTER — Ambulatory Visit (INDEPENDENT_AMBULATORY_CARE_PROVIDER_SITE_OTHER): Payer: PPO | Admitting: Physician Assistant

## 2020-10-15 VITALS — BP 127/68 | HR 60 | Temp 97.8°F | Ht 66.0 in | Wt 171.0 lb

## 2020-10-15 DIAGNOSIS — Z23 Encounter for immunization: Secondary | ICD-10-CM

## 2020-10-15 DIAGNOSIS — E785 Hyperlipidemia, unspecified: Secondary | ICD-10-CM | POA: Diagnosis not present

## 2020-10-15 DIAGNOSIS — Z0000 Encounter for general adult medical examination without abnormal findings: Secondary | ICD-10-CM

## 2020-10-15 DIAGNOSIS — R7303 Prediabetes: Secondary | ICD-10-CM

## 2020-10-15 DIAGNOSIS — E559 Vitamin D deficiency, unspecified: Secondary | ICD-10-CM | POA: Diagnosis not present

## 2020-10-15 DIAGNOSIS — E039 Hypothyroidism, unspecified: Secondary | ICD-10-CM

## 2020-10-15 DIAGNOSIS — D508 Other iron deficiency anemias: Secondary | ICD-10-CM

## 2020-10-15 NOTE — Progress Notes (Signed)
Subjective:   Stacy Boyd is a 65 y.o. female who presents for Medicare Annual (Subsequent) preventive examination.  Review of Systems    General:   No F/C, wt loss Pulm:   No DIB, SOB, pleuritic chest pain Card:  No CP, palpitations Abd:  No n/v/d or pain Ext:  No inc edema from baseline    Objective:    Today's Vitals   10/15/20 0936  BP: 127/68  Pulse: 60  Temp: 97.8 F (36.6 C)  SpO2: 99%  Weight: 171 lb (77.6 kg)  Height: 5\' 6"  (1.676 m)   Body mass index is 27.6 kg/m.  Advanced Directives 03/04/2020 03/04/2020 02/23/2020 11/20/2019 11/14/2019  Does Patient Have a Medical Advance Directive? - No No No No  Would patient like information on creating a medical advance directive? No - Patient declined - - No - Patient declined Yes (MAU/Ambulatory/Procedural Areas - Information given)    Current Medications (verified) Outpatient Encounter Medications as of 10/15/2020  Medication Sig   cetirizine (ZYRTEC) 10 MG tablet Take 10 mg by mouth daily as needed for allergies.   Cholecalciferol (VITAMIN D-3) 125 MCG (5000 UT) TABS Take 5,000 Units by mouth daily.   cycloSPORINE, PF, (CEQUA) 0.09 % SOLN Place 1 drop into both eyes 2 (two) times daily.   Eyelid Cleansers (THERATEARS STERILID CLEANSER EX) Apply 1 application topically 2 (two) times daily.   famotidine-calcium carbonate-magnesium hydroxide (PEPCID COMPLETE) 10-800-165 MG chewable tablet Chew 1 tablet by mouth at bedtime. Chewable   Melatonin 2.5 MG CAPS Take 2.5 mg by mouth at bedtime.   Multiple Vitamins-Minerals (CENTRUM SILVER 50+WOMEN PO) Take 1 tablet daily by mouth.   Omega-3 Fatty Acids (FISH OIL ULTRA) 1400 MG CAPS Take 1,400 mg by mouth daily.   Polyethyl Glycol-Propyl Glycol 0.4-0.3 % SOLN Place 1 drop into both eyes 4 (four) times daily as needed (Dry eyes).   SPIRULINA PO Take 3,000 mg by mouth daily.   testosterone cypionate (DEPOTESTOTERONE CYPIONATE) 100 MG/ML injection Inject 75 mg into the muscle  See admin instructions. Every 12 weeks   TURMERIC PO Take 2,000 mg by mouth daily.   aspirin EC 325 MG EC tablet Take 1 tablet (325 mg total) by mouth 2 (two) times daily. Then take one 81 mg aspirin once a day for three weeks. Then discontinue aspirin.   Calcium-Phosphorus-Vitamin D (CITRACAL CALCIUM GUMMIES) 250-115-250 MG-MG-UNIT CHEW Chew 1 tablet by mouth daily.   gabapentin (NEURONTIN) 300 MG capsule Take a 300 mg capsule three times a day for two weeks following surgery.Then take a 300 mg capsule two times a day for two weeks. Then take a 300 mg capsule once a day for two weeks. Then discontinue.   methocarbamol (ROBAXIN) 500 MG tablet Take 1 tablet (500 mg total) by mouth every 6 (six) hours as needed for muscle spasms.   oxyCODONE (OXY IR/ROXICODONE) 5 MG immediate release tablet Take 1-2 tablets (5-10 mg total) by mouth every 6 (six) hours as needed for severe pain.   traMADol (ULTRAM) 50 MG tablet Take 1-2 tablets (50-100 mg total) by mouth every 6 (six) hours as needed for moderate pain.   No facility-administered encounter medications on file as of 10/15/2020.    Allergies (verified) Erythromycin   History: Past Medical History:  Diagnosis Date   Anemia    Arthritis    Depression    Currently not being treated   Dry eye    GERD (gastroesophageal reflux disease)    H. pylori infection  History of hiatal hernia    Hypothyroid    Pneumonia    Pre-diabetes    Past Surgical History:  Procedure Laterality Date   ABDOMINAL HYSTERECTOMY     CESAREAN SECTION     COLONOSCOPY     REFRACTIVE SURGERY     TOTAL KNEE ARTHROPLASTY Right 11/20/2019   Procedure: TOTAL KNEE ARTHROPLASTY;  Surgeon: Gaynelle Arabian, MD;  Location: [facility];  Service: Orthopedics;  Laterality: Right;  18min   TOTAL KNEE ARTHROPLASTY Left 03/04/2020   Procedure: TOTAL KNEE ARTHROPLASTY;  Surgeon: Gaynelle Arabian, MD;  Location: [facility];  Service: Orthopedics;  Laterality: Left;  53min   Family History   Problem Relation Age of Onset   Colon cancer Father    Breast cancer Sister    Prostate cancer Brother    Stomach cancer Neg Hx    Rectal cancer Neg Hx    Pancreatic cancer Neg Hx    Social History   Socioeconomic History   Marital status: Single    Spouse name: Not on file   Number of children: Not on file   Years of education: Not on file   Highest education level: Not on file  Occupational History   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[employer]OYED  Tobacco Use   Smoking status: Never   Smokeless tobacco: Never  Vaping Use   Vaping Use: Never used  Substance and Sexual Activity   Alcohol use: Yes    Alcohol/week: 1.0 standard drink    Types: 1 Standard drinks or equivalent per week    Comment: occ   Drug use: No   Sexual activity: Not Currently    Birth control/protection: None, Post-menopausal  Other Topics Concern   Not on file  Social History Narrative   Not on file   Social Determinants of Health   Financial Resource Strain: Not on file  Food Insecurity: Not on file  Transportation Needs: Not on file  Physical Activity: Not on file  Stress: Not on file  Social Connections: Not on file    Tobacco Counseling Counseling given: Not Answered    Diabetic? no   Activities of Daily Living In your present state of health, do you have any difficulty performing the following activities: 10/15/2020 03/04/2020  Hearing? N N  Vision? N N  Difficulty concentrating or making decisions? N N  Walking or climbing stairs? N N  Comment - -  Dressing or bathing? N N  Doing errands, shopping? N N  Some recent data might be hidden    Patient Care Team: Lorrene Reid, PA-C as PCP - General Valinda Party, MD (Rheumatology) Dian Queen, MD as Consulting Physician (Obstetrics and Gynecology) Milus Banister, MD as Attending Physician (Gastroenterology) Wilford Corner, MD as Consulting Physician  (Gastroenterology)  Indicate any recent Medical Services you may have received from other than [facility] providers in the past year (date may be approximate).     Assessment:   This is a routine wellness examination for Kimori.  Hearing/Vision screen No results found.  Dietary issues and exercise activities discussed: -Follows a well balanced, lean meat diet. States has been able to be more active since her knee replacement.   Goals Addressed   None   Depression Screen PHQ 2/9 Scores 10/15/2020 10/11/2019 04/03/2019 11/12/2017 04/22/2017 02/16/2017 11/24/2016  PHQ - 2 Score 0 - 0 0 0 0 0  PHQ- 9 Score 0 - $R'4 3 2 4 5  'Qn$ Exception Documentation -  Patient refusal - - - - -    Fall Risk Fall Risk  10/15/2020 10/11/2019 04/03/2019 11/12/2017 04/22/2017  Falls in the past year? 1 0 1 0 No  Number falls in past yr: 0 - 0 - -  Injury with Fall? 1 - 0 - -  Follow up Falls evaluation completed Falls evaluation completed Falls evaluation completed - -    FALL RISK PREVENTION PERTAINING TO THE HOME:  Any stairs in or around the home? Yes  If so, are there any without handrails? No  Home free of loose throw rugs in walkways, pet beds, electrical cords, etc? Yes  Adequate lighting in your home to reduce risk of falls? Yes   ASSISTIVE DEVICES UTILIZED TO PREVENT FALLS:  Life alert? No  Use of a cane, walker or w/c? No  Grab bars in the bathroom? Yes  Shower chair or bench in shower? No  Elevated toilet seat or a handicapped toilet? Yes   TIMED UP AND GO:  Was the test performed? Yes .  Length of time to ambulate 10 feet: 10 sec.   Gait steady and fast without use of assistive device  Cognitive Function: wnl's     6CIT Screen 10/15/2020 04/03/2019  What Year? 0 points 0 points  What month? 0 points 0 points  What time? 0 points 0 points  Count back from 20 0 points 0 points  Months in reverse 0 points 0 points  Repeat phrase 0 points 0 points  Total Score 0 0    Immunizations Immunization  History  Administered Date(s) Administered   Fluad Quad(high Dose 65+) 10/15/2020   Influenza, High Dose Seasonal PF 10/11/2019   MMR 02/13/1957   Moderna Sars-Covid-2 Vaccination 02/20/2019, 03/13/2019   PPD Test 01/13/1979   Pneumococcal Conjugate-13 02/25/2018   Pneumococcal-Unspecified 01/13/2003, 01/13/2008   Td 01/13/2007   Tdap 01/13/2007, 01/12/2010, 11/26/2017   Zoster Recombinat (Shingrix) 11/26/2017, 02/25/2018   Zoster, Live 08/29/2013    TDAP status: Up to date  Flu Vaccine status: Due, Education has been provided regarding the importance of this vaccine. Advised may receive this vaccine at local pharmacy or Health Dept. Aware to provide a copy of the vaccination record if obtained from local pharmacy or Health Dept. Verbalized acceptance and understanding.  Pneumococcal vaccine status: Up to date  Covid-19 vaccine status: Completed vaccines  Qualifies for Shingles Vaccine? Yes   Zostavax completed Yes   Shingrix Completed?: Yes  Screening Tests Health Maintenance  Topic Date Due   MAMMOGRAM  12/24/2017   COLONOSCOPY (Pts 45-2yrs Insurance coverage will need to be confirmed)  09/27/2024   TETANUS/TDAP  11/27/2027   DEXA SCAN  Completed   COVID-19 Vaccine  Completed   Hepatitis C Screening  Completed   Zoster Vaccines- Shingrix  Completed   HPV VACCINES  Aged Out   INFLUENZA VACCINE  Discontinued    Health Maintenance  Health Maintenance Due  Topic Date Due   MAMMOGRAM  12/24/2017    Colorectal cancer screening: Type of screening: Colonoscopy. Completed 09/27/2020. Repeat every 10 years  Mammogram status: Completed 05/2020. Repeat every year  Bone Density status: Completed 2016. Results reflect: Bone density results: OSTEOPENIA. Repeat every 2 years.  Lung Cancer Screening: (Low Dose CT Chest recommended if Age 71-80 years, 30 pack-year currently smoking OR have quit w/in 15years.) does not qualify.   Lung Cancer Screening Referral:  no  Additional Screening:  Hepatitis C Screening: does not qualify; Completed 2021  Vision Screening: Recommended annual ophthalmology  exams for early detection of glaucoma and other disorders of the eye. Is the patient up to date with their annual eye exam?  Yes  Who is the provider or what is the name of the office in which the patient attends annual eye exams? [facility] If pt is not established with a provider, would they like to be referred to a provider to establish care? No .   Dental Screening: Recommended annual dental exams for proper oral hygiene  Community Resource Referral / Chronic Care Management: CRR required this visit?  No   CCM required this visit?  No      Plan:  -Will obtain fasting labs. -Patient provided copy of mammogram results from 05/22/2020. -Followed by OB/GYN (manage thyroid medication). -Continue to follow up with rheumatology ([facility]). -Reports 1 fall related to tripping and landing on her shoulder. No new falls. -UTD immunizations, will update influenza vaccine. -Follow up in 1 year for MCW and FBW.  I have personally reviewed and noted the following in the patient's chart:   Medical and social history Use of alcohol, tobacco or illicit drugs  Current medications and supplements including opioid prescriptions.  Functional ability and status Nutritional status Physical activity Advanced directives List of other physicians Hospitalizations, surgeries, and ER visits in previous 12 months Vitals Screenings to include cognitive, depression, and falls Referrals and appointments  In addition, I have reviewed and discussed with patient certain preventive protocols, quality metrics, and best practice recommendations. A written personalized care plan for preventive services as well as general preventive health recommendations were provided to patient.       10/15/2020

## 2020-10-15 NOTE — Patient Instructions (Signed)
Preventive Care 40 Years and Older, Female Preventive care refers to lifestyle choices and visits with your health care provider that can promote health and wellness. This includes: A yearly physical exam. This is also called an annual wellness visit. Regular dental and eye exams. Immunizations. Screening for certain conditions. Healthy lifestyle choices, such as: Eating a healthy diet. Getting regular exercise. Not using drugs or products that contain nicotine and tobacco. Limiting alcohol use. What can I expect for my preventive care visit? Physical exam Your health care provider will check your: Height and weight. These may be used to calculate your BMI (body mass index). BMI is a measurement that tells if you are at a healthy weight. Heart rate and blood pressure. Body temperature. Skin for abnormal spots. Counseling Your health care provider may ask you questions about your: Past medical problems. Family's medical history. Alcohol, tobacco, and drug use. Emotional well-being. Home life and relationship well-being. Sexual activity. Diet, exercise, and sleep habits. History of falls. Memory and ability to understand (cognition). Work and work Statistician. Pregnancy and menstrual history. Access to firearms. What immunizations do I need? Vaccines are usually given at various ages, according to a schedule. Your health care provider will recommend vaccines for you based on your age, medical history, and lifestyle or other factors, such as travel or where you work. What tests do I need? Blood tests Lipid and cholesterol levels. These may be checked every 5 years, or more often depending on your overall health. Hepatitis C test. Hepatitis B test. Screening Lung cancer screening. You may have this screening every year starting at age 67 if you have a 30-pack-year history of smoking and currently smoke or have quit within the past 15 years. Colorectal cancer screening. All  adults should have this screening starting at age 67 and continuing until age 9. Your health care provider may recommend screening at age 67 if you are at increased risk. You will have tests every 1-10 years, depending on your results and the type of screening test. Diabetes screening. This is done by checking your blood sugar (glucose) after you have not eaten for a while (fasting). You may have this done every 1-3 years. Mammogram. This may be done every 1-2 years. Talk with your health care provider about how often you should have regular mammograms. Abdominal aortic aneurysm (AAA) screening. You may need this if you are a current or former smoker. BRCA-related cancer screening. This may be done if you have a family history of breast, ovarian, tubal, or peritoneal cancers. Other tests STD (sexually transmitted disease) testing, if you are at risk. Bone density scan. This is done to screen for osteoporosis. You may have this done starting at age 43. Talk with your health care provider about your test results, treatment options, and if necessary, the need for more tests. Follow these instructions at home: Eating and drinking  Eat a diet that includes fresh fruits and vegetables, whole grains, lean protein, and low-fat dairy products. Limit your intake of foods with high amounts of sugar, saturated fats, and salt. Take vitamin and mineral supplements as recommended by your health care provider. Do not drink alcohol if your health care provider tells you not to drink. If you drink alcohol: Limit how much you have to 0-1 drink a day. Be aware of how much alcohol is in your drink. In the [place] one drink equals one 12 oz bottle of beer (355 mL), one 5 oz glass of wine (148 mL), or one  1 oz glass of hard liquor (44 mL). Lifestyle Take daily care of your teeth and gums. Brush your teeth every morning and night with fluoride toothpaste. Floss one time each day. Stay active. Exercise for at least  30 minutes 5 or more days each week. Do not use any products that contain nicotine or tobacco, such as cigarettes, e-cigarettes, and chewing tobacco. If you need help quitting, ask your health care provider. Do not use drugs. If you are sexually active, practice safe sex. Use a condom or other form of protection in order to prevent STIs (sexually transmitted infections). Talk with your health care provider about taking a low-dose aspirin or statin. Find healthy ways to cope with stress, such as: Meditation, yoga, or listening to music. Journaling. Talking to a trusted person. Spending time with friends and family. Safety Always wear your seat belt while driving or riding in a vehicle. Do not drive: If you have been drinking alcohol. Do not ride with someone who has been drinking. When you are tired or distracted. While texting. Wear a helmet and other protective equipment during sports activities. If you have firearms in your house, make sure you follow all gun safety procedures. What's next? Visit your health care provider once a year for an annual wellness visit. Ask your health care provider how often you should have your eyes and teeth checked. Stay up to date on all vaccines. This information is not intended to replace advice given to you by your health care provider. Make sure you discuss any questions you have with your health care provider. Document Revised: 03/08/2020 Document Reviewed: 12/23/2017 Elsevier Patient Education  2022 [employer].

## 2020-10-16 LAB — COMPREHENSIVE METABOLIC PANEL
ALT: 17 IU/L (ref 0–32)
AST: 18 IU/L (ref 0–40)
Albumin/Globulin Ratio: 2 (ref 1.2–2.2)
Albumin: 4.7 g/dL (ref 3.8–4.8)
Alkaline Phosphatase: 69 IU/L (ref 44–121)
BUN/Creatinine Ratio: 19 (ref 12–28)
BUN: 15 mg/dL (ref 8–27)
Bilirubin Total: 0.3 mg/dL (ref 0.0–1.2)
CO2: 24 mmol/L (ref 20–29)
Calcium: 9.9 mg/dL (ref 8.7–10.3)
Chloride: 99 mmol/L (ref 96–106)
Creatinine, Ser: 0.8 mg/dL (ref 0.57–1.00)
Globulin, Total: 2.3 g/dL (ref 1.5–4.5)
Glucose: 97 mg/dL (ref 70–99)
Potassium: 4.9 mmol/L (ref 3.5–5.2)
Sodium: 140 mmol/L (ref 134–144)
Total Protein: 7 g/dL (ref 6.0–8.5)
eGFR: 81 mL/min/{1.73_m2} (ref >59)

## 2020-10-16 LAB — TSH: TSH: 3.48 u[IU]/mL (ref 0.450–4.500)

## 2020-10-16 LAB — CBC WITH DIFFERENTIAL/PLATELET
Basophils Absolute: 0 10*3/uL (ref 0.0–0.2)
Basos: 1 %
EOS (ABSOLUTE): 0.2 10*3/uL (ref 0.0–0.4)
Eos: 4 %
Hematocrit: 45.7 % (ref 34.0–46.6)
Hemoglobin: 15.1 g/dL (ref 11.1–15.9)
Immature Grans (Abs): 0 10*3/uL (ref 0.0–0.1)
Immature Granulocytes: 0 %
Lymphocytes Absolute: 1.2 10*3/uL (ref 0.7–3.1)
Lymphs: 26 %
MCH: 30.8 pg (ref 26.6–33.0)
MCHC: 33 g/dL (ref 31.5–35.7)
MCV: 93 fL (ref 79–97)
Monocytes Absolute: 0.5 10*3/uL (ref 0.1–0.9)
Monocytes: 12 %
Neutrophils Absolute: 2.6 10*3/uL (ref 1.4–7.0)
Neutrophils: 57 %
Platelets: 361 10*3/uL (ref 150–450)
RBC: 4.9 x10E6/uL (ref 3.77–5.28)
RDW: 14.5 % (ref 11.7–15.4)
WBC: 4.5 10*3/uL (ref 3.4–10.8)

## 2020-10-16 LAB — LIPID PANEL
Chol/HDL Ratio: 2.9 ratio (ref 0.0–4.4)
Cholesterol, Total: 255 mg/dL — ABNORMAL HIGH (ref 100–199)
HDL: 87 mg/dL (ref >39)
LDL Chol Calc (NIH): 150 mg/dL — ABNORMAL HIGH (ref 0–99)
Triglycerides: 105 mg/dL (ref 0–149)
VLDL Cholesterol Cal: 18 mg/dL (ref 5–40)

## 2020-10-16 LAB — VITAMIN D 25 HYDROXY (VIT D DEFICIENCY, FRACTURES): Vit D, 25-Hydroxy: 78.3 ng/mL (ref 30.0–100.0)

## 2020-10-16 LAB — IRON,TIBC AND FERRITIN PANEL
Ferritin: 46 ng/mL (ref 15–150)
Iron Saturation: 28 % (ref 15–55)
Iron: 104 ug/dL (ref 27–139)
Total Iron Binding Capacity: 367 ug/dL (ref 250–450)
UIBC: 263 ug/dL (ref 118–369)

## 2020-10-16 LAB — HEMOGLOBIN A1C
Est. average glucose Bld gHb Est-mCnc: 120 mg/dL
Hgb A1c MFr Bld: 5.8 % — ABNORMAL HIGH (ref 4.8–5.6)

## 2020-10-21 ENCOUNTER — Encounter: Payer: Self-pay | Admitting: Physician Assistant

## 2020-10-22 DIAGNOSIS — Z23 Encounter for immunization: Secondary | ICD-10-CM | POA: Diagnosis not present

## 2020-10-22 DIAGNOSIS — E281 Androgen excess: Secondary | ICD-10-CM | POA: Diagnosis not present

## 2020-10-30 DIAGNOSIS — R6882 Decreased libido: Secondary | ICD-10-CM | POA: Diagnosis not present

## 2020-10-31 DIAGNOSIS — M25511 Pain in right shoulder: Secondary | ICD-10-CM | POA: Diagnosis not present

## 2020-11-04 DIAGNOSIS — M25511 Pain in right shoulder: Secondary | ICD-10-CM | POA: Diagnosis not present

## 2020-11-11 DIAGNOSIS — M25511 Pain in right shoulder: Secondary | ICD-10-CM | POA: Diagnosis not present

## 2020-11-18 DIAGNOSIS — M25511 Pain in right shoulder: Secondary | ICD-10-CM | POA: Diagnosis not present

## 2020-11-27 DIAGNOSIS — R6882 Decreased libido: Secondary | ICD-10-CM | POA: Diagnosis not present

## 2020-12-25 DIAGNOSIS — R6882 Decreased libido: Secondary | ICD-10-CM | POA: Diagnosis not present

## 2021-01-12 HISTORY — PX: COLONOSCOPY: SHX174

## 2021-01-22 DIAGNOSIS — R7989 Other specified abnormal findings of blood chemistry: Secondary | ICD-10-CM | POA: Diagnosis not present

## 2021-01-22 DIAGNOSIS — E039 Hypothyroidism, unspecified: Secondary | ICD-10-CM | POA: Diagnosis not present

## 2021-01-23 ENCOUNTER — Other Ambulatory Visit: Payer: Self-pay | Admitting: Sports Medicine

## 2021-01-23 ENCOUNTER — Ambulatory Visit
Admission: RE | Admit: 2021-01-23 | Discharge: 2021-01-23 | Disposition: A | Discharge status: Home / Self Care | Payer: PPO | Source: Ambulatory Visit | Attending: Sports Medicine | Admitting: Sports Medicine

## 2021-01-23 DIAGNOSIS — M25511 Pain in right shoulder: Secondary | ICD-10-CM

## 2021-01-31 DIAGNOSIS — Z96651 Presence of right artificial knee joint: Secondary | ICD-10-CM | POA: Diagnosis not present

## 2021-01-31 DIAGNOSIS — Z96652 Presence of left artificial knee joint: Secondary | ICD-10-CM | POA: Diagnosis not present

## 2021-02-19 DIAGNOSIS — M8580 Other specified disorders of bone density and structure, unspecified site: Secondary | ICD-10-CM | POA: Diagnosis not present

## 2021-02-19 DIAGNOSIS — R768 Other specified abnormal immunological findings in serum: Secondary | ICD-10-CM | POA: Diagnosis not present

## 2021-02-19 DIAGNOSIS — M810 Age-related osteoporosis without current pathological fracture: Secondary | ICD-10-CM | POA: Diagnosis not present

## 2021-02-19 DIAGNOSIS — M3500 Sicca syndrome, unspecified: Secondary | ICD-10-CM | POA: Diagnosis not present

## 2021-02-19 DIAGNOSIS — M1990 Unspecified osteoarthritis, unspecified site: Secondary | ICD-10-CM | POA: Diagnosis not present

## 2021-02-21 DIAGNOSIS — M9907 Segmental and somatic dysfunction of upper extremity: Secondary | ICD-10-CM | POA: Diagnosis not present

## 2021-02-21 DIAGNOSIS — S46011D Strain of muscle(s) and tendon(s) of the rotator cuff of right shoulder, subsequent encounter: Secondary | ICD-10-CM | POA: Diagnosis not present

## 2021-02-21 DIAGNOSIS — M25511 Pain in right shoulder: Secondary | ICD-10-CM | POA: Diagnosis not present

## 2021-02-28 DIAGNOSIS — H35381 Toxic maculopathy, right eye: Secondary | ICD-10-CM | POA: Diagnosis not present

## 2021-02-28 DIAGNOSIS — H25813 Combined forms of age-related cataract, bilateral: Secondary | ICD-10-CM | POA: Diagnosis not present

## 2021-02-28 DIAGNOSIS — H0102B Squamous blepharitis left eye, upper and lower eyelids: Secondary | ICD-10-CM | POA: Diagnosis not present

## 2021-02-28 DIAGNOSIS — H04123 Dry eye syndrome of bilateral lacrimal glands: Secondary | ICD-10-CM | POA: Diagnosis not present

## 2021-02-28 DIAGNOSIS — H40013 Open angle with borderline findings, low risk, bilateral: Secondary | ICD-10-CM | POA: Diagnosis not present

## 2021-02-28 DIAGNOSIS — H0102A Squamous blepharitis right eye, upper and lower eyelids: Secondary | ICD-10-CM | POA: Diagnosis not present

## 2021-02-28 DIAGNOSIS — H40033 Anatomical narrow angle, bilateral: Secondary | ICD-10-CM | POA: Diagnosis not present

## 2021-03-24 DIAGNOSIS — M2550 Pain in unspecified joint: Secondary | ICD-10-CM | POA: Diagnosis not present

## 2021-03-24 DIAGNOSIS — M3500 Sicca syndrome, unspecified: Secondary | ICD-10-CM | POA: Diagnosis not present

## 2021-03-24 DIAGNOSIS — N951 Menopausal and female climacteric states: Secondary | ICD-10-CM | POA: Diagnosis not present

## 2021-03-24 DIAGNOSIS — E039 Hypothyroidism, unspecified: Secondary | ICD-10-CM | POA: Diagnosis not present

## 2021-03-24 DIAGNOSIS — G479 Sleep disorder, unspecified: Secondary | ICD-10-CM | POA: Diagnosis not present

## 2021-03-24 DIAGNOSIS — R5383 Other fatigue: Secondary | ICD-10-CM | POA: Diagnosis not present

## 2021-03-24 DIAGNOSIS — R232 Flushing: Secondary | ICD-10-CM | POA: Diagnosis not present

## 2021-03-24 DIAGNOSIS — R6882 Decreased libido: Secondary | ICD-10-CM | POA: Diagnosis not present

## 2021-03-24 DIAGNOSIS — Z6828 Body mass index (BMI) 28.0-28.9, adult: Secondary | ICD-10-CM | POA: Diagnosis not present

## 2021-04-04 DIAGNOSIS — M25511 Pain in right shoulder: Secondary | ICD-10-CM | POA: Diagnosis not present

## 2021-04-04 DIAGNOSIS — M9907 Segmental and somatic dysfunction of upper extremity: Secondary | ICD-10-CM | POA: Diagnosis not present

## 2021-04-21 DIAGNOSIS — N951 Menopausal and female climacteric states: Secondary | ICD-10-CM | POA: Diagnosis not present

## 2021-04-21 DIAGNOSIS — E039 Hypothyroidism, unspecified: Secondary | ICD-10-CM | POA: Diagnosis not present

## 2021-04-23 DIAGNOSIS — E039 Hypothyroidism, unspecified: Secondary | ICD-10-CM | POA: Diagnosis not present

## 2021-04-23 DIAGNOSIS — R6882 Decreased libido: Secondary | ICD-10-CM | POA: Diagnosis not present

## 2021-04-23 DIAGNOSIS — M2550 Pain in unspecified joint: Secondary | ICD-10-CM | POA: Diagnosis not present

## 2021-04-23 DIAGNOSIS — N951 Menopausal and female climacteric states: Secondary | ICD-10-CM | POA: Diagnosis not present

## 2021-04-23 DIAGNOSIS — Z6828 Body mass index (BMI) 28.0-28.9, adult: Secondary | ICD-10-CM | POA: Diagnosis not present

## 2021-04-23 DIAGNOSIS — G479 Sleep disorder, unspecified: Secondary | ICD-10-CM | POA: Diagnosis not present

## 2021-04-27 ENCOUNTER — Other Ambulatory Visit: Payer: Self-pay

## 2021-04-27 ENCOUNTER — Ambulatory Visit
Admission: EM | Admit: 2021-04-27 | Discharge: 2021-04-27 | Disposition: A | Discharge status: Home / Self Care | Payer: HMO | Attending: Physician Assistant | Admitting: Physician Assistant

## 2021-04-27 DIAGNOSIS — U071 COVID-19: Secondary | ICD-10-CM | POA: Diagnosis not present

## 2021-04-27 MED ORDER — NIRMATRELVIR/RITONAVIR (PAXLOVID)TABLET: 3.0000 | ORAL_TABLET | Freq: Two times a day (BID) | ORAL | 0 refills | Status: AC

## 2021-04-27 MED ORDER — NIRMATRELVIR/RITONAVIR (PAXLOVID)TABLET: 3.0000 | ORAL_TABLET | Freq: Two times a day (BID) | ORAL | 0 refills | Status: DC

## 2021-04-27 NOTE — ED Provider Notes (Signed)
?[facility] ? ? ? ?CSN: [id number] ?Arrival date & time: 04/27/21  0830 ? ? ?  ? ?History   ?Chief Complaint ?Chief Complaint  ?Patient presents with  ? Sore Throat  ? Cough  ? Generalized Body Aches  ? ? ?HPI ?Stacy Boyd is a 66 y.o. female.  ? ?Patient here c/w "COVID", reports she tested positive at home today.  Admits 1 day history of chills, fatigue, nasal congestion, rhinorrhea, sore throat, cough, myalgia.  Denies fever, n/v/d, abdominal pain, wheezing, SOB.  No advil or tylenol today.  She has had COVID vaccines.  No history of asthma, DM, COPD, HTN. ? ? ?Past Medical History:  ?Diagnosis Date  ? Anemia   ? Arthritis   ? Depression   ? Currently not being treated  ? Dry eye   ? GERD (gastroesophageal reflux disease)   ? H. pylori infection   ? History of hiatal hernia   ? Hypothyroid   ? Pneumonia   ? Pre-diabetes   ? ? ?Patient Active Problem List  ? Diagnosis Date Noted  ? Primary osteoarthritis of left knee 03/04/2020  ? Primary osteoarthritis of right knee 11/20/2019  ? Disorder of thyroid gland 10/11/2019  ? Obesity 04/03/2019  ? Bilateral knee pain 12/14/2017  ? Leukocytosis 11/12/2017  ? Helicobacter pylori gastritis 04/22/2017  ? Synovial cyst of left popliteal space 03/12/2017  ? NSAID long-term use 02/16/2017  ? Tricompartment osteoarthritis of knee 02/16/2017  ? Acquired iron deficiency anemia due to decreased absorption- due to rheum meds and tums etc 02/16/2017  ? Rheumatoid arthritis (HCC) 11/24/2016  ? Low testosterone level in female 11/24/2016  ? Family history of colon cancer 08/31/2014  ? Bilateral leg edema 08/29/2013  ? Osteoarthritis, knee 08/29/2012  ? Acquired hypothyroidism 05/07/2010  ? Osteopenia 05/07/2010  ? Rosacea 05/07/2010  ? Sjogren's disease (HCC) 05/07/2010  ? Menopausal hot flushes 02/11/2009  ? ? ?Past Surgical History:  ?Procedure Laterality Date  ? ABDOMINAL HYSTERECTOMY    ? CESAREAN SECTION    ? COLONOSCOPY    ? REFRACTIVE SURGERY    ? TOTAL  KNEE ARTHROPLASTY Right 11/20/2019  ? Procedure: TOTAL KNEE ARTHROPLASTY;  Surgeon: Ollen Gross, MD;  Location: [facility];  Service: Orthopedics;  Laterality: Right;   ? TOTAL KNEE ARTHROPLASTY Left 03/04/2020  ? Procedure: TOTAL KNEE ARTHROPLASTY;  Surgeon: Ollen Gross, MD;  Location: [facility];  Service: Orthopedics;  Laterality: Left;   ? ? ?OB History   ?No obstetric history on file. ?  ? ? ? ?Home Medications   ? ?Prior to Admission medications   ?Medication Sig Start Date End Date Taking? Authorizing Provider  ?nirmatrelvir/ritonavir EUA (PAXLOVID) 20 x 150 MG & 10 x 100MG  TABS Take 3 tablets by mouth 2 (two) times daily for 5 days. Patient GFR is 81. Take nirmatrelvir (150 mg) two tablets twice daily for 5 days and ritonavir (100 mg) one tablet twice daily for 5 days. 04/27/21 05/02/21 Yes Evern Core, PA-C  ?aspirin EC 325 MG EC tablet Take 1 tablet (325 mg total) by mouth 2 (two) times daily. Then take one 81 mg aspirin once a day for three weeks. Then discontinue aspirin. 03/05/20   Alyssa Grove, PA-C  ?Calcium-Phosphorus-Vitamin D (CITRACAL CALCIUM GUMMIES) 250-115-250 MG-MG-UNIT CHEW Chew 1 tablet by mouth daily.    [provider]  ?cetirizine (ZYRTEC) 10 MG tablet Take 10 mg by mouth daily as needed for allergies.    [provider]  ?  Cholecalciferol (VITAMIN D-3) 125 MCG (5000 UT) TABS Take 5,000 Units by mouth daily.    [provider]  ?cycloSPORINE, PF, (CEQUA) 0.09 % SOLN Place 1 drop into both eyes 2 (two) times daily.    [provider]  ?Eyelid Cleansers (THERATEARS STERILID CLEANSER EX) Apply 1 application topically 2 (two) times daily.    [provider]  ?famotidine-calcium carbonate-magnesium hydroxide (PEPCID COMPLETE) 10-800-165 MG chewable tablet Chew 1 tablet by mouth at bedtime. Chewable    [provider]  ?gabapentin (NEURONTIN) 300 MG capsule Take a 300 mg capsule three times a day for two weeks following  surgery.Then take a 300 mg capsule two times a day for two weeks. Then take a 300 mg capsule once a day for two weeks. Then discontinue. 03/05/20   Alyssa Grovehildress, Sean D, PA-C  ?Melatonin 2.5 MG CAPS Take 2.5 mg by mouth at bedtime.    [provider]  ?methocarbamol (ROBAXIN) 500 MG tablet Take 1 tablet (500 mg total) by mouth every 6 (six) hours as needed for muscle spasms. 03/05/20   Alyssa Grovehildress, Sean D, PA-C  ?Multiple Vitamins-Minerals (CENTRUM SILVER 50+WOMEN PO) Take 1 tablet daily by mouth.    [provider]  ?Omega-3 Fatty Acids (FISH OIL ULTRA) 1400 MG CAPS Take 1,400 mg by mouth daily.    [provider]  ?oxyCODONE (OXY IR/ROXICODONE) 5 MG immediate release tablet Take 1-2 tablets (5-10 mg total) by mouth every 6 (six) hours as needed for severe pain. 03/05/20   Alyssa Grovehildress, Sean D, PA-C  ?Polyethyl Glycol-Propyl Glycol 0.4-0.3 % SOLN Place 1 drop into both eyes 4 (four) times daily as needed (Dry eyes).    [provider]  ?SPIRULINA PO Take 3,000 mg by mouth daily.    [provider]  ?testosterone cypionate (DEPOTESTOTERONE CYPIONATE) 100 MG/ML injection Inject 75 mg into the muscle See admin instructions. Every 12 weeks    [provider]  ?traMADol (ULTRAM) 50 MG tablet Take 1-2 tablets (50-100 mg total) by mouth every 6 (six) hours as needed for moderate pain. 03/05/20   Alyssa Grovehildress, Sean D, PA-C  ?TURMERIC PO Take 2,000 mg by mouth daily.    [provider]  ? ? ?Family History ?Family History  ?Problem Relation Age of Onset  ? Colon cancer Father   ? Breast cancer Sister   ? Prostate cancer Brother   ? Stomach cancer Neg Hx   ? Rectal cancer Neg Hx   ? Pancreatic cancer Neg Hx   ? ? ?Social History ?Social History  ? ?Tobacco Use  ? Smoking status: Never  ? Smokeless tobacco: Never  ?Vaping Use  ? Vaping Use: Never used  ?Substance Use Topics  ? Alcohol use: Yes  ?  Alcohol/week: 1.0 standard drink  ?  Types: 1 Standard drinks or equivalent per  week  ?  Comment: occ  ? Drug use: No  ? ? ? ?Allergies   ?Erythromycin ? ? ?Review of Systems ?Review of Systems  ?Constitutional:  Positive for fatigue. Negative for chills and fever.  ?HENT:  Positive for congestion, rhinorrhea and sore throat. Negative for ear pain, nosebleeds, postnasal drip, sinus pressure and sinus pain.   ?Eyes:  Negative for pain and redness.  ?Respiratory:  Positive for cough. Negative for shortness of breath and wheezing.   ?Gastrointestinal:  Negative for abdominal pain, diarrhea, nausea and vomiting.  ?Musculoskeletal:  Positive for arthralgias and myalgias.  ?Skin:  Negative for rash.  ?Neurological:  Negative for dizziness,  weakness, light-headedness and headaches.  ?Hematological:  Negative for adenopathy. Does not bruise/bleed easily.  ?Psychiatric/Behavioral:  Negative for confusion and sleep disturbance.   ? ? ?Physical Exam ?Triage Vital Signs ?ED Triage Vitals  ?Enc Vitals Group  ?   BP 04/27/21 1052 (!) 141/78  ?   Pulse Rate 04/27/21 1052 79  ?   Resp 04/27/21 1052 16  ?   Temp 04/27/21 1052 99.3 ?F (37.4 ?C)  ?   Temp Source 04/27/21 1052 Oral  ?   SpO2 04/27/21 1052 96 %  ?   Weight --   ?   Height --   ?   Head Circumference --   ?   Peak Flow --   ?   Pain Score 04/27/21 1051 4  ?   Pain Loc --   ?   Pain Edu? --   ?   Excl. in GC? --   ? ?No data found. ? ?Updated Vital Signs ?BP (!) 141/78 (BP Location: Left Arm)   Pulse 79   Temp 99.3 ?F (37.4 ?C) (Oral)   Resp 16   SpO2 96%  ? ?Visual Acuity ?Right Eye Distance:   ?Left Eye Distance:   ?Bilateral Distance:   ? ?Right Eye Near:   ?Left Eye Near:    ?Bilateral Near:    ? ?Physical Exam ?Vitals and nursing note reviewed.  ?Constitutional:   ?   General: She is not in acute distress. ?   Appearance: Normal appearance. She is not ill-appearing.  ?HENT:  ?   Head: Normocephalic and atraumatic.  ?   Right Ear: Tympanic membrane and ear canal normal.  ?   Left Ear: Tympanic membrane and ear canal normal.  ?   Nose:  Congestion and rhinorrhea present.  ?Eyes:  ?   General: No scleral icterus. ?   Extraocular Movements: Extraocular movements intact.  ?   Conjunctiva/sclera: Conjunctivae normal.  ?Cardiovascular:  ?   Rate and Rhythm: Nor

## 2021-04-27 NOTE — ED Triage Notes (Signed)
Patient presents to Urgent Care with complaints of sore throat, cough, and body aches since yesterday evening. Pt tested positive for COVID today.  ? ?Denies fever.  ?

## 2021-04-27 NOTE — Discharge Instructions (Addendum)
Go to ED if you have worsening symptoms ?

## 2021-05-26 DIAGNOSIS — Z124 Encounter for screening for malignant neoplasm of cervix: Secondary | ICD-10-CM | POA: Diagnosis not present

## 2021-05-26 DIAGNOSIS — Z1231 Encounter for screening mammogram for malignant neoplasm of breast: Secondary | ICD-10-CM | POA: Diagnosis not present

## 2021-05-26 DIAGNOSIS — Z1272 Encounter for screening for malignant neoplasm of vagina: Secondary | ICD-10-CM | POA: Diagnosis not present

## 2021-05-26 DIAGNOSIS — Z6827 Body mass index (BMI) 27.0-27.9, adult: Secondary | ICD-10-CM | POA: Diagnosis not present

## 2021-05-27 LAB — HM PAP SMEAR

## 2021-06-24 DIAGNOSIS — N951 Menopausal and female climacteric states: Secondary | ICD-10-CM | POA: Diagnosis not present

## 2021-06-24 DIAGNOSIS — E039 Hypothyroidism, unspecified: Secondary | ICD-10-CM | POA: Diagnosis not present

## 2021-06-26 DIAGNOSIS — Z6828 Body mass index (BMI) 28.0-28.9, adult: Secondary | ICD-10-CM | POA: Diagnosis not present

## 2021-06-26 DIAGNOSIS — E039 Hypothyroidism, unspecified: Secondary | ICD-10-CM | POA: Diagnosis not present

## 2021-06-26 DIAGNOSIS — N951 Menopausal and female climacteric states: Secondary | ICD-10-CM | POA: Diagnosis not present

## 2021-06-26 DIAGNOSIS — R232 Flushing: Secondary | ICD-10-CM | POA: Diagnosis not present

## 2021-06-26 DIAGNOSIS — G479 Sleep disorder, unspecified: Secondary | ICD-10-CM | POA: Diagnosis not present

## 2021-06-26 DIAGNOSIS — M2550 Pain in unspecified joint: Secondary | ICD-10-CM | POA: Diagnosis not present

## 2021-06-26 DIAGNOSIS — R6882 Decreased libido: Secondary | ICD-10-CM | POA: Diagnosis not present

## 2021-08-01 ENCOUNTER — Encounter: Payer: Self-pay | Admitting: Gastroenterology

## 2021-09-03 ENCOUNTER — Telehealth: Payer: Self-pay | Admitting: *Deleted

## 2021-09-03 NOTE — Telephone Encounter (Signed)
DJ patient rescheduled with [facility].

## 2021-09-09 ENCOUNTER — Ambulatory Visit (AMBULATORY_SURGERY_CENTER): Payer: PPO | Admitting: *Deleted

## 2021-09-09 VITALS — Ht 66.5 in | Wt 175.0 lb

## 2021-09-09 DIAGNOSIS — Z800 Family history of malignant neoplasm of digestive organs: Secondary | ICD-10-CM

## 2021-09-09 MED ORDER — PEG 3350-KCL-NA BICARB-NACL 420 G PO SOLR: 4000.0000 mL | Freq: Once | ORAL | 0 refills | Status: AC

## 2021-09-09 NOTE — Progress Notes (Signed)
Patient is here in-person for PV. Patient denies any allergies to eggs or soy. Patient denies any problems with anesthesia/sedation. Patient is not on any oxygen at home. Patient is not taking any diet/weight loss medications or blood thinners. Went over procedure prep instructions with the patient. Patient is aware of our care-partner policy.   

## 2021-09-23 ENCOUNTER — Encounter: Payer: Self-pay | Admitting: Gastroenterology

## 2021-09-23 DIAGNOSIS — E039 Hypothyroidism, unspecified: Secondary | ICD-10-CM | POA: Diagnosis not present

## 2021-09-23 DIAGNOSIS — N951 Menopausal and female climacteric states: Secondary | ICD-10-CM | POA: Diagnosis not present

## 2021-09-26 DIAGNOSIS — G479 Sleep disorder, unspecified: Secondary | ICD-10-CM | POA: Diagnosis not present

## 2021-09-26 DIAGNOSIS — E039 Hypothyroidism, unspecified: Secondary | ICD-10-CM | POA: Diagnosis not present

## 2021-09-26 DIAGNOSIS — R6882 Decreased libido: Secondary | ICD-10-CM | POA: Diagnosis not present

## 2021-09-26 DIAGNOSIS — N951 Menopausal and female climacteric states: Secondary | ICD-10-CM | POA: Diagnosis not present

## 2021-09-26 DIAGNOSIS — Z6828 Body mass index (BMI) 28.0-28.9, adult: Secondary | ICD-10-CM | POA: Diagnosis not present

## 2021-10-01 ENCOUNTER — Encounter: Payer: PPO | Admitting: Gastroenterology

## 2021-10-08 ENCOUNTER — Encounter: Payer: Self-pay | Admitting: Gastroenterology

## 2021-10-08 ENCOUNTER — Encounter: Payer: PPO | Admitting: Gastroenterology

## 2021-10-08 ENCOUNTER — Ambulatory Visit (AMBULATORY_SURGERY_CENTER): Payer: PPO | Admitting: Gastroenterology

## 2021-10-08 VITALS — BP 113/50 | HR 58 | Temp 97.5°F | Resp 11 | Ht 66.0 in | Wt 175.0 lb

## 2021-10-08 DIAGNOSIS — Z1211 Encounter for screening for malignant neoplasm of colon: Secondary | ICD-10-CM

## 2021-10-08 DIAGNOSIS — E039 Hypothyroidism, unspecified: Secondary | ICD-10-CM | POA: Diagnosis not present

## 2021-10-08 DIAGNOSIS — Z800 Family history of malignant neoplasm of digestive organs: Secondary | ICD-10-CM | POA: Diagnosis not present

## 2021-10-08 DIAGNOSIS — R7303 Prediabetes: Secondary | ICD-10-CM | POA: Diagnosis not present

## 2021-10-08 DIAGNOSIS — F32A Depression, unspecified: Secondary | ICD-10-CM | POA: Diagnosis not present

## 2021-10-08 MED ORDER — SODIUM CHLORIDE 0.9 % IV SOLN: 500.0000 mL | Freq: Once | INTRAVENOUS | Status: DC

## 2021-10-08 NOTE — Progress Notes (Signed)
Report to pacu rn. Vss. Care resumed by rn. 

## 2021-10-08 NOTE — Progress Notes (Signed)
[facility] History and Physical   Primary Care Physician:  Mayer Masker, PA-C   Reason for Procedure:   Colon cancer screening  Plan:    Colonoscopy     HPI: Stacy Boyd is a 66 y.o. female undergoing risk screening colonoscopy.  She has no no chronic lower GI symptoms. Her father was diagnosed with colon cancer in his 87s. She had a colonoscopy in 2016 by Dr. Bosie Clos at [facility] which showed internal hemorrhoids, otherwise normal.  She was recommended to repeat colonoscopy in 5 years.  She states she has 2 previous colonoscopies and has not had any polyps.   Past Medical History:  Diagnosis Date   Anemia    Arthritis    Depression    Currently not being treated   Dry eye    GERD (gastroesophageal reflux disease)    H. pylori infection    History of hiatal hernia    Hypothyroid    Pneumonia    Pre-diabetes     Past Surgical History:  Procedure Laterality Date   ABDOMINAL HYSTERECTOMY     CESAREAN SECTION     COLONOSCOPY  2016   at [facility]   REFRACTIVE SURGERY     TOTAL KNEE ARTHROPLASTY Right 11/20/2019   Procedure: TOTAL KNEE ARTHROPLASTY;  Surgeon: Ollen Gross, MD;  Location: [facility];  Service: Orthopedics;  Laterality: Right;    TOTAL KNEE ARTHROPLASTY Left 03/04/2020   Procedure: TOTAL KNEE ARTHROPLASTY;  Surgeon: Ollen Gross, MD;  Location: [facility];  Service: Orthopedics;  Laterality: Left;     Prior to Admission medications   Medication Sig Start Date End Date Taking? Authorizing Provider  Calcium-Phosphorus-Vitamin D (CITRACAL +D3 PO)  03/21/20  Yes [provider]  cetirizine (ZYRTEC) 10 MG tablet Take 10 mg by mouth daily as needed for allergies.   Yes [provider]  Cholecalciferol (VITAMIN D-3) 125 MCG (5000 UT) TABS Take 5,000 Units by mouth daily.   Yes [provider]  Coenzyme Q10 (COQ10) 100 MG CAPS  02/12/21  Yes [provider]  cycloSPORINE (RESTASIS) 0.05 % ophthalmic  emulsion  08/25/21  Yes [provider]  Eyelid Cleansers (THERATEARS STERILID CLEANSER EX) Apply 1 application topically 2 (two) times daily.   Yes [provider]  famotidine-calcium carbonate-magnesium hydroxide (PEPCID COMPLETE) 10-800-165 MG chewable tablet Chew 1 tablet by mouth at bedtime. Chewable   Yes [provider]  Multiple Vitamins-Minerals (CENTRUM SILVER 50+WOMEN PO) Take 1 tablet daily by mouth.   Yes [provider]  Omega-3 Fatty Acids (FISH OIL ULTRA) 1400 MG CAPS Take 1,400 mg by mouth daily.   Yes [provider]  Polyethyl Glycol-Propyl Glycol 0.4-0.3 % SOLN Place 1 drop into both eyes 4 (four) times daily as needed (Dry eyes).   Yes [provider]  progesterone (PROMETRIUM) 100 MG capsule 100 mg. 06/26/21  Yes [provider]  SPIRULINA PO Take 3,000 mg by mouth daily.   Yes [provider]  Testosterone Undecanoate 150 MG CAPS 150 mg 1 day or 1 dose. Pellet inplant 09/26/21  Yes [provider]  Thyroid (NATURE-THROID PO) COMPOUNDED MEDICATION  LIOTHYRONINE SODIUM (T3)/THYROXINE-L(T4) SR 30-100MCG CAPSULE  Take 1 capsule by mouth in the morning on an empty stomach.   Yes [provider]  TURMERIC PO Take 2,000 mg by mouth daily.   Yes [provider]  Testosterone 100 MG PLLT  03/24/21   [provider]    Current Outpatient Medications  Medication Sig Dispense Refill   Calcium-Phosphorus-Vitamin D (CITRACAL +D3 PO)      cetirizine (ZYRTEC) 10 MG tablet Take 10 mg by mouth daily as needed for allergies.     Cholecalciferol (VITAMIN D-3) 125 MCG (5000 UT) TABS Take 5,000 Units by mouth daily.     Coenzyme Q10 (COQ10) 100 MG CAPS      cycloSPORINE (RESTASIS) 0.05 % ophthalmic emulsion      Eyelid Cleansers (THERATEARS STERILID CLEANSER EX) Apply 1 application topically 2 (two) times daily.     famotidine-calcium carbonate-magnesium hydroxide (PEPCID COMPLETE)  10-800-165 MG chewable tablet Chew 1 tablet by mouth at bedtime. Chewable     Multiple Vitamins-Minerals (CENTRUM SILVER 50+WOMEN PO) Take 1 tablet daily by mouth.     Omega-3 Fatty Acids (FISH OIL ULTRA) 1400 MG CAPS Take 1,400 mg by mouth daily.     Polyethyl Glycol-Propyl Glycol 0.4-0.3 % SOLN Place 1 drop into both eyes 4 (four) times daily as needed (Dry eyes).     progesterone (PROMETRIUM) 100 MG capsule 100 mg.     SPIRULINA PO Take 3,000 mg by mouth daily.     Testosterone Undecanoate 150 MG CAPS 150 mg 1 day or 1 dose. Pellet inplant     Thyroid (NATURE-THROID PO) COMPOUNDED MEDICATION  LIOTHYRONINE SODIUM (T3)/THYROXINE-L(T4) SR 30-100MCG CAPSULE  Take 1 capsule by mouth in the morning on an empty stomach.     TURMERIC PO Take 2,000 mg by mouth daily.     Testosterone 100 MG PLLT      Current Facility-Administered Medications  Medication Dose Route Frequency Provider Last Rate Last Admin   0.9 %  sodium chloride infusion  500 mL Intravenous Once Daryel November, MD        Allergies as of 10/08/2021 - Review Complete 10/08/2021  Allergen Reaction Noted   Erythromycin Hives and Nausea Only 05/17/2010    Family History  Problem Relation Age of Onset   Colon cancer Father 63   Breast cancer Sister    Prostate cancer Brother    Stomach cancer Neg Hx    Rectal cancer Neg Hx    Pancreatic cancer Neg Hx    Esophageal cancer Neg Hx     Social History   Socioeconomic History   Marital status: Single    Spouse name: Not on file   Number of children: Not on file   Years of education: Not on file   Highest education level: Not on file  Occupational History   Occupation: Engineer, petroleum. 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  Tobacco Use   Smoking status: Never   Smokeless tobacco: Never  Vaping Use   Vaping Use: Never used  Substance and Sexual Activity   Alcohol use: Yes    Alcohol/week: 2.0 standard drinks of alcohol    Types: 2 Standard drinks or  equivalent per week   Drug use: No   Sexual activity: Not Currently    Birth control/protection: None, Post-menopausal  Other Topics Concern   Not on file  Social History Narrative   Not on file   Social Determinants of Health   Financial Resource Strain: Not on file  Food Insecurity: Not on file  Transportation Needs: Not on file  Physical Activity: Unknown (11/24/2016)   Exercise Vital Sign    Days of Exercise per Week: 5 days    Minutes of Exercise per Session: Not on file  Stress: Not on file  Social Connections: Not on file  Intimate Partner Violence: Not on file    Review of Systems:  All other review of systems negative except as mentioned in the HPI.  Physical Exam: Vital signs BP (!) 124/59   Pulse (!) 57   Temp (!) 97.5 F (36.4 C)   Ht 5\' 6"  (1.676 m)   Wt 175 lb (79.4 kg)   SpO2 100%   BMI 28.25 kg/m   General:   Alert,  Well-developed, well-nourished, pleasant and cooperative in NAD Airway:  Mallampati 1 Lungs:  Clear throughout to auscultation.   Heart:  Regular rate and rhythm; no murmurs, clicks, rubs,  or gallops. Abdomen:  Soft, nontender and nondistended. Normal bowel sounds.   Neuro/Psych:  Normal mood and affect. A and O x 3   Stacy Boyd E. , MD [facility]

## 2021-10-08 NOTE — Op Note (Signed)
[place] Patient Name: Stacy Boyd Procedure Date: 10/08/2021 8:01 AM MRN: [id number] Endoscopist: Nicki Reaper E. Candis Schatz , MD Age: 66 Referring MD:  Date of Birth: 1955-12-03 Gender: Female Account #: [id number] Procedure:                Colonoscopy Indications:              Screening in patient at increased risk: Colorectal                            cancer in father 48 or older Medicines:                Monitored Anesthesia Care Procedure:                Pre-Anesthesia Assessment:                           - Prior to the procedure, a History and Physical                            was performed, and patient medications and                            allergies were reviewed. The patient's tolerance of                            previous anesthesia was also reviewed. The risks                            and benefits of the procedure and the sedation                            options and risks were discussed with the patient.                            All questions were answered, and informed consent                            was obtained. Prior Anticoagulants: The patient has                            taken no previous anticoagulant or antiplatelet                            agents. ASA Grade Assessment: II - A patient with                            mild systemic disease. After reviewing the risks                            and benefits, the patient was deemed in                            satisfactory condition to undergo the procedure.  After obtaining informed consent, the colonoscope                            was passed under direct vision. Throughout the                            procedure, the patient's blood pressure, pulse, and                            oxygen saturations were monitored continuously. The                            CF [id number] was introduced through the anus                            and advanced to the the  cecum, identified by                            appendiceal orifice and ileocecal valve. The                            colonoscopy was somewhat difficult due to a                            tortuous colon. Successful completion of the                            procedure was aided by using manual pressure. The                            patient tolerated the procedure well. The quality                            of the bowel preparation was good. The ileocecal                            valve, appendiceal orifice, and rectum were                            photographed. The bowel preparation used was                            GoLYTELY via split dose instruction. Scope In: 8:06:15 AM Scope Out: 8:26:03 AM Scope Withdrawal Time: 0 hours 10 minutes 21 seconds  Total Procedure Duration: 0 hours 19 minutes 48 seconds  Findings:                 The perianal and digital rectal examinations were                            normal. Pertinent negatives include normal                            sphincter tone and no palpable rectal lesions.  A few small-mouthed diverticula were found in the                            sigmoid colon and ascending colon. There was no                            evidence of diverticular bleeding.                           The exam was otherwise normal throughout the                            examined colon.                           The retroflexed view of the distal rectum and anal                            verge was normal and showed no anal or rectal                            abnormalities. Complications:            No immediate complications. Estimated Blood Loss:     Estimated blood loss: none. Impression:               - Mild diverticulosis in the sigmoid colon and in                            the ascending colon. There was no evidence of                            diverticular bleeding.                           - The distal rectum  and anal verge are normal on                            retroflexion view.                           - No specimens collected. Recommendation:           - Patient has a contact number available for                            emergencies. The signs and symptoms of potential                            delayed complications were discussed with the                            patient. Return to normal activities tomorrow.                            Written discharge instructions were provided to the  patient.                           - Resume previous diet.                           - Continue present medications.                           - Consider repeat colonoscopy in 10 years for                            screening purposes. Given that patient will be >                            75, would recommend proceeding with colonoscopy                            based on patient's relative health at that time. Shawnia Vizcarrondo E. Candis Schatz, MD 10/08/2021 8:36:23 AM This report has been signed electronically.

## 2021-10-08 NOTE — Patient Instructions (Addendum)
Handout on diverticulosis given to you today  Return to normal activities tomorrow. Resume previous diet. Continue present medications.   YOU HAD AN ENDOSCOPIC PROCEDURE TODAY AT [facility]:   Refer to the procedure report that was given to you for any specific questions about what was found during the examination.  If the procedure report does not answer your questions, please call your gastroenterologist to clarify.  If you requested that your care partner not be given the details of your procedure findings, then the procedure report has been included in a sealed envelope for you to review at your convenience later.  YOU SHOULD EXPECT: Some feelings of bloating in the abdomen. Passage of more gas than usual.  Walking can help get rid of the air that was put into your GI tract during the procedure and reduce the bloating. If you had a lower endoscopy (such as a colonoscopy or flexible sigmoidoscopy) you may notice spotting of blood in your stool or on the toilet paper. If you underwent a bowel prep for your procedure, you may not have a normal bowel movement for a few days.  Please Note:  You might notice some irritation and congestion in your nose or some drainage.  This is from the oxygen used during your procedure.  There is no need for concern and it should clear up in a day or so.  SYMPTOMS TO REPORT IMMEDIATELY:  Following lower endoscopy (colonoscopy or flexible sigmoidoscopy):  Excessive amounts of blood in the stool  Significant tenderness or worsening of abdominal pains  Swelling of the abdomen that is new, acute  Fever of 100F or higher  For urgent or emergent issues, a gastroenterologist can be reached at any hour by calling [phone]. Do not use MyChart messaging for urgent concerns.    DIET:  We do recommend a small meal at first, but then you may proceed to your regular diet.  Drink plenty of fluids but you should avoid alcoholic beverages for 24  hours.  ACTIVITY:  You should plan to take it easy for the rest of today and you should NOT DRIVE or use heavy machinery until tomorrow (because of the sedation medicines used during the test).    FOLLOW UP: Our staff will call the number listed on your records the next business day following your procedure.  We will call around 7:15- 8:00 am to check on you and address any questions or concerns that you may have regarding the information given to you following your procedure. If we do not reach you, we will leave a message.    Consider repeat colonoscopy in 10 years for screening purposes. Given that patient will be > 75, would recommend proceeding with colonoscopy based on patient's relative health at that time.  SIGNATURES/CONFIDENTIALITY: You and/or your care partner have signed paperwork which will be entered into your electronic medical record.  These signatures attest to the fact that that the information above on your After Visit Summary has been reviewed and is understood.  Full responsibility of the confidentiality of this discharge information lies with you and/or your care-partner.

## 2021-10-09 ENCOUNTER — Telehealth: Payer: Self-pay | Admitting: *Deleted

## 2021-10-09 NOTE — Telephone Encounter (Signed)
  Follow up Call-     10/08/2021    7:18 AM  Call back number  Post procedure Call Back phone  # [phone]  Permission to leave phone message Yes     Patient questions:  Do you have a fever, pain , or abdominal swelling? No. Pain Score  0 *  Have you tolerated food without any problems? Yes.    Have you been able to return to your normal activities? Yes.    Do you have any questions about your discharge instructions: Diet   No. Medications  No. Follow up visit  No.  Do you have questions or concerns about your Care? No.  Actions: * If pain score is 4 or above: No action needed, pain <4.

## 2021-10-20 ENCOUNTER — Ambulatory Visit (INDEPENDENT_AMBULATORY_CARE_PROVIDER_SITE_OTHER): Payer: PPO | Admitting: Physician Assistant

## 2021-10-20 ENCOUNTER — Encounter: Payer: Self-pay | Admitting: Physician Assistant

## 2021-10-20 VITALS — BP 122/78 | HR 56 | Temp 98.0°F | Ht 66.5 in | Wt 182.0 lb

## 2021-10-20 DIAGNOSIS — Z0000 Encounter for general adult medical examination without abnormal findings: Secondary | ICD-10-CM

## 2021-10-20 DIAGNOSIS — E785 Hyperlipidemia, unspecified: Secondary | ICD-10-CM | POA: Diagnosis not present

## 2021-10-20 DIAGNOSIS — R7303 Prediabetes: Secondary | ICD-10-CM | POA: Diagnosis not present

## 2021-10-20 NOTE — Progress Notes (Signed)
Subjective:   Stacy Boyd is a 66 y.o. female who presents for Medicare Annual (Subsequent) preventive examination.  Review of Systems    General:   No F/C, wt loss Pulm:   No DIB, SOB, pleuritic chest pain Card:  No CP, palpitations Abd:  No n/v/d or pain Ext:  No edema  Objective:    Today's Vitals   10/20/21 0828 10/20/21 0848  BP: (!) 148/89 122/78  Pulse: (!) 56   Temp: 98 F (36.7 C)   TempSrc: Temporal   Weight: 182 lb (82.6 kg)   Height: 5' 6.5" (1.689 m)    Body mass index is 28.94 kg/m.     03/04/2020    2:47 PM 03/04/2020    2:43 PM 02/23/2020   10:25 AM 11/20/2019   11:04 AM 11/14/2019   11:34 AM  Advanced Directives  Does Patient Have a Medical Advance Directive?  No No No No  Would patient like information on creating a medical advance directive? No - Patient declined   No - Patient declined Yes (MAU/Ambulatory/Procedural Areas - Information given)    Current Medications (verified) Outpatient Encounter Medications as of 10/20/2021  Medication Sig   Calcium-Phosphorus-Vitamin D (CITRACAL +D3 PO)    cetirizine (ZYRTEC) 10 MG tablet Take 10 mg by mouth daily as needed for allergies.   Cholecalciferol (VITAMIN D-3) 125 MCG (5000 UT) TABS Take 5,000 Units by mouth daily.   Coenzyme Q10 (COQ10) 100 MG CAPS    cycloSPORINE (RESTASIS) 0.05 % ophthalmic emulsion    Eyelid Cleansers (THERATEARS STERILID CLEANSER EX) Apply 1 application topically 2 (two) times daily.   famotidine-calcium carbonate-magnesium hydroxide (PEPCID COMPLETE) 10-800-165 MG chewable tablet Chew 1 tablet by mouth at bedtime. Chewable   Multiple Vitamins-Minerals (CENTRUM SILVER 50+WOMEN PO) Take 1 tablet daily by mouth.   Omega-3 Fatty Acids (FISH OIL ULTRA) 1400 MG CAPS Take 1,400 mg by mouth daily.   Polyethyl Glycol-Propyl Glycol 0.4-0.3 % SOLN Place 1 drop into both eyes 4 (four) times daily as needed (Dry eyes).   progesterone (PROMETRIUM) 100 MG capsule 100 mg.   SPIRULINA PO  Take 3,000 mg by mouth daily.   Thyroid (NATURE-THROID PO) COMPOUNDED MEDICATION  LIOTHYRONINE SODIUM (T3)/THYROXINE-L(T4) SR 30-100MCG CAPSULE  Take 1 capsule by mouth in the morning on an empty stomach.   TURMERIC PO Take 2,000 mg by mouth daily.   Testosterone Undecanoate 150 MG CAPS 150 mg 1 day or 1 dose. Pellet inplant   No facility-administered encounter medications on file as of 10/20/2021.    Allergies (verified) Erythromycin   History: Past Medical History:  Diagnosis Date   Anemia    Arthritis    Depression    Currently not being treated   Dry eye    GERD (gastroesophageal reflux disease)    H. pylori infection    History of hiatal hernia    Hypothyroid    Pneumonia    Pre-diabetes    Past Surgical History:  Procedure Laterality Date   ABDOMINAL HYSTERECTOMY     CESAREAN SECTION     COL[facility]OSCOPY  2016   at [facility]   REFRACTIVE SURGERY     TOTAL KNEE ARTHROPLASTY Right 11/20/2019   Procedure: TOTAL KNEE ARTHROPLASTY;  Surgeon: Ollen Gross, MD;  Location: [facility];  Service: Orthopedics;  Laterality: Right;    TOTAL KNEE ARTHROPLASTY Left 03/04/2020   Procedure: TOTAL KNEE ARTHROPLASTY;  Surgeon: Ollen Gross, MD;  Location: [facility];  Service: Orthopedics;  Laterality: Left;   Family History  Problem Relation Age of Onset   Colon cancer Father 87   Breast cancer Sister    Prostate cancer Brother    Stomach cancer Neg Hx    Rectal cancer Neg Hx    Pancreatic cancer Neg Hx    Esophageal cancer Neg Hx    Social History   Socioeconomic History   Marital status: Single    Spouse name: Not on file   Number of children: Not on file   Years of education: Not on file   Highest education level: Not on file  Occupational History   Occupation: Dance movement psychotherapist. music teacher    Employer: [employer], [employer] S[employer] EM[employer]  Tobacco Use   Smoking status: Never   Smokeless tobacco: Never  Vaping Use   Vaping Use: Never used  Substance and Sexual Activity    Alcohol use: Yes    Alcohol/week: 2.0 standard drinks of alcohol    Types: 2 Standard drinks or equivalent per week   Drug use: No   Sexual activity: Not Currently    Birth control/protection: None, Post-menopausal  Other Topics Concern   Not on file  Social History Narrative   Not on file   Social Determinants of Health   Financial Resource Strain: Not on file  Food Insecurity: Not on file  Transportation Needs: Not on file  Physical Activity: Unknown (11/24/2016)   Exercise Vital Sign    Days of Exercise per Week: 5 days    Minutes of Exercise per Session: Not on file  Stress: Not on file  Social Connections: Not on file    Tobacco Counseling Counseling given: Not Answered   Diabetic?no, The 10-year ASCVD risk score (Arnett DK, et al., 2019) is: 6.8%   Values used to calculate the score:     Age: 36 years     Sex: Female     Is Non-Hispanic African American: No     Diabetic: No     Tobacco smoker: No     Systolic Blood Pressure: 122 mmHg     Is BP treated: No     HDL Cholesterol: 87 mg/dL     Total Cholesterol: 255 mg/dL    Activities of Daily Living    10/20/2021    8:30 AM  In your present state of health, do you have any difficulty performing the following activities:  Hearing? 0  Vision? 1  Difficulty concentrating or making decisions? 0  Walking or climbing stairs? 0  Dressing or bathing? 0  Doing errands, shopping? 0    Patient Care Team: Mayer Masker, PA-C as PCP - General Rossie Muskrat, MD (Rheumatology) Marcelle Overlie, MD as Consulting Physician (Obstetrics and Gynecology) Rachael Fee, MD as Attending Physician (Gastroenterology) Charlott Rakes, MD as Consulting Physician (Gastroenterology)  Indicate any recent Medical Services you may have received from other than [facility] providers in the past year (date may be approximate).     Assessment:   This is a routine wellness examination for Stacy Boyd.  Hearing/Vision screen No results  found.  Dietary issues and exercise activities discussed: -Heart Healthy diet and reports has been more active.    Goals Addressed   None   Depression Screen    10/20/2021    8:29 AM 10/15/2020    9:38 AM 10/11/2019    1:54 PM 04/03/2019    9:26 AM 11/12/2017    8:25 AM 04/22/2017    9:46 AM 02/16/2017    1:25 PM  PHQ 2/9 Scores  PHQ - 2 Score 0 0  0 0 0 0  PHQ- 9 Score 5 0  $'4 3 2 4  'b$ Exception Documentation   Patient refusal        Fall Risk    10/20/2021    8:30 AM 10/15/2020    9:38 AM 10/11/2019    1:54 PM 04/03/2019    9:24 AM 11/12/2017    8:26 AM  Fall Risk   Falls in the past year? 0 1 0 1 0  Number falls in past yr: 0 0  0   Injury with Fall? 0 1  0   Risk for fall due to : No Fall Risks      Follow up Falls evaluation completed Falls evaluation completed Falls evaluation completed Falls evaluation completed     [place]:  Any stairs in or around the home? Yes  If so, are there any without handrails? No  Home free of loose throw rugs in walkways, pet beds, electrical cords, etc? Yes  Adequate lighting in your home to reduce risk of falls? Yes   ASSISTIVE DEVICES UTILIZED TO PREVENT FALLS:  Life alert? No  Use of a cane, walker or w/c? No  Grab bars in the bathroom? Yes  Shower chair or bench in shower? No  Elevated toilet seat or a handicapped toilet? No   TIMED UP AND GO:  Was the test performed? Yes .  Length of time to ambulate 10 feet: 11 sec.   Gait steady and fast without use of assistive device  Cognitive Function: wnl's        10/20/2021    8:29 AM 10/15/2020    9:39 AM 04/03/2019    9:30 AM  6CIT Screen  What Year? 0 points 0 points 0 points  What month? 0 points 0 points 0 points  What time? 0 points 0 points 0 points  Count back from 20 0 points 0 points 0 points  Months in reverse 0 points 0 points 0 points  Repeat phrase 0 points 0 points 0 points  Total Score 0 points 0 points 0 points     Immunizations Immunization History  Administered Date(s) Administered   Fluad Quad(high Dose 65+) 10/15/2020   Influenza, High Dose Seasonal PF 10/11/2019   Influenza-Unspecified 10/02/2021   MMR 02/13/1957   Moderna Sars-Covid-2 Vaccination 02/20/2019, 03/13/2019   PPD Test 01/13/1979   Pneumococcal Conjugate-13 02/25/2018   Pneumococcal-Unspecified 01/13/2003, 01/13/2008   Td 01/13/2007   Tdap 01/13/2007, 01/12/2010, 11/26/2017   Zoster Recombinat (Shingrix) 11/26/2017, 02/25/2018   Zoster, Live 08/29/2013    TDAP status: Up to date  Flu Vaccine status: Up to date  Pneumococcal vaccine status: Up to date  Covid-19 vaccine status: Completed vaccines  Qualifies for Shingles Vaccine? Yes   Zostavax completed No   Shingrix Completed?: Yes  Screening Tests Health Maintenance  Topic Date Due   Pneumonia Vaccine 54+ Years old (2 - PPSV23 or PCV20) 02/26/2019   COVID-19 Vaccine (3 - Moderna series) 05/08/2019   MAMMOGRAM  05/23/2022   TETANUS/TDAP  11/27/2027   COLONOSCOPY (Pts 45-65yrs Insurance coverage will need to be confirmed)  10/09/2031   DEXA SCAN  Completed   Hepatitis C Screening  Completed   Zoster Vaccines- Shingrix  Completed   HPV VACCINES  Aged Out   INFLUENZA VACCINE  Discontinued    Health Maintenance  Health Maintenance Due  Topic Date Due   Pneumonia Vaccine 71+ Years old (2 -  PPSV23 or PCV20) 02/26/2019   COVID-19 Vaccine (3 - Moderna series) 05/08/2019    Colorectal cancer screening: Type of screening: Colonoscopy. Completed 10/08/2021. Repeat every 10 years  Mammogram status: Completed 05/22/2020. Repeat every year  Bone Density status: Ordered scheduled 11/10/21 with [facility]. Pt provided with contact info and advised to call to schedule appt.  Lung Cancer Screening: (Low Dose CT Chest recommended if Age 57-80 years, 30 pack-year currently smoking OR have quit w/in 15years.) does not qualify.   Lung Cancer Screening Referral:  n/a  Additional Screening:  Hepatitis C Screening: does qualify; Completed deferred  Vision Screening: Recommended annual ophthalmology exams for early detection of glaucoma and other disorders of the eye. Is the patient up to date with their annual eye exam?  Yes  Who is the provider or what is the name of the office in which the patient attends annual eye exams? Dr. Katy Fitch If pt is not established with a provider, would they like to be referred to a provider to establish care? No .   Dental Screening: Recommended annual dental exams for proper oral hygiene  Community Resource Referral / Chronic Care Management: CRR required this visit?  No   CCM required this visit?  No      Plan:  -Patient followed by [facility]-Rheumatology, will request upcoming bone density report. -Patient followed by Lyn Henri MD for HRT.   -Will repeat lipid panel and A1c. Pending lipid results, patient would like to consider calcium score to help evaluate for the need of statin therapy which is reasonable. -The 10-year ASCVD risk score (Arnett DK, et al., 2019) is: 6.8%   Values used to calculate the score:     Age: 21 years     Sex: Female     Is Non-Hispanic African American: No     Diabetic: No     Tobacco smoker: No     Systolic Blood Pressure: 479 mmHg     Is BP treated: No     HDL Cholesterol: 87 mg/dL     Total Cholesterol: 255 mg/dL -BP elevated on intake, repeated with improvement and wnl's. -Follow-up in 1 year for annual medicare wellness or sooner if needed.  I have personally reviewed and noted the following in the patient's chart:   Medical and social history Use of alcohol, tobacco or illicit drugs  Current medications and supplements including opioid prescriptions. Patient is not currently taking opioid prescriptions. Functional ability and status Nutritional status Physical activity Advanced directives List of other physicians Hospitalizations, surgeries, and ER visits in previous 12  months Vitals Screenings to include cognitive, depression, and falls Referrals and appointments  In addition, I have reviewed and discussed with patient certain preventive protocols, quality metrics, and best practice recommendations. A written personalized care plan for preventive services as well as general preventive health recommendations were provided to patient.     Lorrene Reid, PA-C   10/20/2021

## 2021-10-20 NOTE — Patient Instructions (Signed)
Preventive Care 65 Years and Older, Female Preventive care refers to lifestyle choices and visits with your health care provider that can promote health and wellness. Preventive care visits are also called wellness exams. What can I expect for my preventive care visit? Counseling Your health care provider may ask you questions about your: Medical history, including: Past medical problems. Family medical history. Pregnancy and menstrual history. History of falls. Current health, including: Memory and ability to understand (cognition). Emotional well-being. Home life and relationship well-being. Sexual activity and sexual health. Lifestyle, including: Alcohol, nicotine or tobacco, and drug use. Access to firearms. Diet, exercise, and sleep habits. Work and work environment. Sunscreen use. Safety issues such as seatbelt and bike helmet use. Physical exam Your health care provider will check your: Height and weight. These may be used to calculate your BMI (body mass index). BMI is a measurement that tells if you are at a healthy weight. Waist circumference. This measures the distance around your waistline. This measurement also tells if you are at a healthy weight and may help predict your risk of certain diseases, such as type 2 diabetes and high blood pressure. Heart rate and blood pressure. Body temperature. Skin for abnormal spots. What immunizations do I need?  Vaccines are usually given at various ages, according to a schedule. Your health care provider will recommend vaccines for you based on your age, medical history, and lifestyle or other factors, such as travel or where you work. What tests do I need? Screening Your health care provider may recommend screening tests for certain conditions. This may include: Lipid and cholesterol levels. Hepatitis C test. Hepatitis B test. HIV (human immunodeficiency virus) test. STI (sexually transmitted infection) testing, if you are at  risk. Lung cancer screening. Colorectal cancer screening. Diabetes screening. This is done by checking your blood sugar (glucose) after you have not eaten for a while (fasting). Mammogram. Talk with your health care provider about how often you should have regular mammograms. BRCA-related cancer screening. This may be done if you have a family history of breast, ovarian, tubal, or peritoneal cancers. Bone density scan. This is done to screen for osteoporosis. Talk with your health care provider about your test results, treatment options, and if necessary, the need for more tests. Follow these instructions at home: Eating and drinking  Eat a diet that includes fresh fruits and vegetables, whole grains, lean protein, and low-fat dairy products. Limit your intake of foods with high amounts of sugar, saturated fats, and salt. Take vitamin and mineral supplements as recommended by your health care provider. Do not drink alcohol if your health care provider tells you not to drink. If you drink alcohol: Limit how much you have to 0-1 drink a day. Know how much alcohol is in your drink. In the [place] one drink equals one 12 oz bottle of beer (355 mL), one 5 oz glass of wine (148 mL), or one 1 oz glass of hard liquor (44 mL). Lifestyle Brush your teeth every morning and night with fluoride toothpaste. Floss one time each day. Exercise for at least 30 minutes 5 or more days each week. Do not use any products that contain nicotine or tobacco. These products include cigarettes, chewing tobacco, and vaping devices, such as e-cigarettes. If you need help quitting, ask your health care provider. Do not use drugs. If you are sexually active, practice safe sex. Use a condom or other form of protection in order to prevent STIs. Take aspirin only as told by   your health care provider. Make sure that you understand how much to take and what form to take. Work with your health care provider to find out whether it  is safe and beneficial for you to take aspirin daily. Ask your health care provider if you need to take a cholesterol-lowering medicine (statin). Find healthy ways to manage stress, such as: Meditation, yoga, or listening to music. Journaling. Talking to a trusted person. Spending time with friends and family. Minimize exposure to UV radiation to reduce your risk of skin cancer. Safety Always wear your seat belt while driving or riding in a vehicle. Do not drive: If you have been drinking alcohol. Do not ride with someone who has been drinking. When you are tired or distracted. While texting. If you have been using any mind-altering substances or drugs. Wear a helmet and other protective equipment during sports activities. If you have firearms in your house, make sure you follow all gun safety procedures. What's next? Visit your health care provider once a year for an annual wellness visit. Ask your health care provider how often you should have your eyes and teeth checked. Stay up to date on all vaccines. This information is not intended to replace advice given to you by your health care provider. Make sure you discuss any questions you have with your health care provider. Document Revised: 06/26/2020 Document Reviewed: 06/26/2020 Elsevier Patient Education  2023 Elsevier Inc.  

## 2021-10-21 LAB — COMPREHENSIVE METABOLIC PANEL
ALT: 24 IU/L (ref 0–32)
AST: 19 IU/L (ref 0–40)
Albumin/Globulin Ratio: 2.4 — ABNORMAL HIGH (ref 1.2–2.2)
Albumin: 4.8 g/dL (ref 3.9–4.9)
Alkaline Phosphatase: 75 IU/L (ref 44–121)
BUN/Creatinine Ratio: 19 (ref 12–28)
BUN: 16 mg/dL (ref 8–27)
Bilirubin Total: 0.4 mg/dL (ref 0.0–1.2)
CO2: 22 mmol/L (ref 20–29)
Calcium: 9.7 mg/dL (ref 8.7–10.3)
Chloride: 100 mmol/L (ref 96–106)
Creatinine, Ser: 0.86 mg/dL (ref 0.57–1.00)
Globulin, Total: 2 g/dL (ref 1.5–4.5)
Glucose: 95 mg/dL (ref 70–99)
Potassium: 4.8 mmol/L (ref 3.5–5.2)
Sodium: 141 mmol/L (ref 134–144)
Total Protein: 6.8 g/dL (ref 6.0–8.5)
eGFR: 74 mL/min/{1.73_m2} (ref >59)

## 2021-10-21 LAB — LIPID PANEL
Chol/HDL Ratio: 2.8 ratio (ref 0.0–4.4)
Cholesterol, Total: 269 mg/dL — ABNORMAL HIGH (ref 100–199)
HDL: 96 mg/dL (ref >39)
LDL Chol Calc (NIH): 159 mg/dL — ABNORMAL HIGH (ref 0–99)
Triglycerides: 86 mg/dL (ref 0–149)
VLDL Cholesterol Cal: 14 mg/dL (ref 5–40)

## 2021-10-21 LAB — HEMOGLOBIN A1C
Est. average glucose Bld gHb Est-mCnc: 126 mg/dL
Hgb A1c MFr Bld: 6 % — ABNORMAL HIGH (ref 4.8–5.6)

## 2021-10-25 ENCOUNTER — Encounter: Payer: Self-pay | Admitting: Physician Assistant

## 2021-10-27 ENCOUNTER — Other Ambulatory Visit: Payer: Self-pay

## 2021-10-27 DIAGNOSIS — E785 Hyperlipidemia, unspecified: Secondary | ICD-10-CM

## 2021-10-27 DIAGNOSIS — R7303 Prediabetes: Secondary | ICD-10-CM

## 2021-10-31 ENCOUNTER — Telehealth: Payer: Self-pay

## 2021-10-31 NOTE — Telephone Encounter (Signed)
Called for a Prior Authorization spoke with Stacy Boyd. On 10/31/2021 he stated no PA is needed

## 2021-11-07 ENCOUNTER — Ambulatory Visit
Admission: RE | Admit: 2021-11-07 | Discharge: 2021-11-07 | Disposition: A | Discharge status: Home / Self Care | Payer: PPO | Source: Ambulatory Visit | Attending: Physician Assistant | Admitting: Physician Assistant

## 2021-11-07 DIAGNOSIS — E785 Hyperlipidemia, unspecified: Secondary | ICD-10-CM

## 2021-11-07 DIAGNOSIS — R7303 Prediabetes: Secondary | ICD-10-CM

## 2021-11-14 DIAGNOSIS — M1990 Unspecified osteoarthritis, unspecified site: Secondary | ICD-10-CM | POA: Diagnosis not present

## 2021-11-14 DIAGNOSIS — M810 Age-related osteoporosis without current pathological fracture: Secondary | ICD-10-CM | POA: Diagnosis not present

## 2021-11-14 DIAGNOSIS — M3500 Sicca syndrome, unspecified: Secondary | ICD-10-CM | POA: Diagnosis not present

## 2021-11-14 DIAGNOSIS — R768 Other specified abnormal immunological findings in serum: Secondary | ICD-10-CM | POA: Diagnosis not present

## 2021-12-24 DIAGNOSIS — E039 Hypothyroidism, unspecified: Secondary | ICD-10-CM | POA: Diagnosis not present

## 2021-12-24 DIAGNOSIS — N951 Menopausal and female climacteric states: Secondary | ICD-10-CM | POA: Diagnosis not present

## 2021-12-26 DIAGNOSIS — Z6829 Body mass index (BMI) 29.0-29.9, adult: Secondary | ICD-10-CM | POA: Diagnosis not present

## 2021-12-26 DIAGNOSIS — M2550 Pain in unspecified joint: Secondary | ICD-10-CM | POA: Diagnosis not present

## 2021-12-26 DIAGNOSIS — E039 Hypothyroidism, unspecified: Secondary | ICD-10-CM | POA: Diagnosis not present

## 2021-12-26 DIAGNOSIS — G479 Sleep disorder, unspecified: Secondary | ICD-10-CM | POA: Diagnosis not present

## 2021-12-26 DIAGNOSIS — N951 Menopausal and female climacteric states: Secondary | ICD-10-CM | POA: Diagnosis not present

## 2021-12-26 DIAGNOSIS — Z79890 Hormone replacement therapy (postmenopausal): Secondary | ICD-10-CM | POA: Diagnosis not present

## 2022-01-20 DIAGNOSIS — M9902 Segmental and somatic dysfunction of thoracic region: Secondary | ICD-10-CM | POA: Diagnosis not present

## 2022-01-20 DIAGNOSIS — M542 Cervicalgia: Secondary | ICD-10-CM | POA: Diagnosis not present

## 2022-01-20 DIAGNOSIS — M9907 Segmental and somatic dysfunction of upper extremity: Secondary | ICD-10-CM | POA: Diagnosis not present

## 2022-01-20 DIAGNOSIS — M25512 Pain in left shoulder: Secondary | ICD-10-CM | POA: Diagnosis not present

## 2022-01-20 DIAGNOSIS — M9901 Segmental and somatic dysfunction of cervical region: Secondary | ICD-10-CM | POA: Diagnosis not present

## 2022-01-20 DIAGNOSIS — M9908 Segmental and somatic dysfunction of rib cage: Secondary | ICD-10-CM | POA: Diagnosis not present

## 2022-01-28 ENCOUNTER — Telehealth: Payer: Self-pay | Admitting: *Deleted

## 2022-01-28 NOTE — Telephone Encounter (Signed)
Pt calling stating she had a cardiac CT scoring and that the recommendation was to have some follow up imaging if you will please review and order the appropriate imaging.  One of the options was a repeat CT and that is the preference of the patient. Syria Kestner Zimmerman Rumple, CMA

## 2022-01-28 NOTE — Telephone Encounter (Signed)
This test, done in October, did recommend she have repeat chest CT in three months due to the pulmonary nodule which was found. She has not seen me before, but I believe I can order the chest CT for follow up. She does not have another scheduled visit until October.

## 2022-01-29 ENCOUNTER — Encounter: Payer: Self-pay | Admitting: Nurse Practitioner

## 2022-01-29 ENCOUNTER — Other Ambulatory Visit: Payer: Self-pay | Admitting: Nurse Practitioner

## 2022-01-29 DIAGNOSIS — R911 Solitary pulmonary nodule: Secondary | ICD-10-CM

## 2022-01-29 NOTE — Telephone Encounter (Signed)
I have placed order for Chest Ct with contrast for her to [place] [facility].

## 2022-01-29 NOTE — Telephone Encounter (Signed)
Contacted pt and she would like for you to go ahead and place that order for the CT.

## 2022-02-10 ENCOUNTER — Ambulatory Visit
Admission: RE | Admit: 2022-02-10 | Discharge: 2022-02-10 | Disposition: A | Discharge status: Home / Self Care | Payer: PPO | Source: Ambulatory Visit | Attending: Nurse Practitioner | Admitting: Nurse Practitioner

## 2022-02-10 DIAGNOSIS — I700 Atherosclerosis of aorta: Secondary | ICD-10-CM | POA: Diagnosis not present

## 2022-02-10 DIAGNOSIS — K449 Diaphragmatic hernia without obstruction or gangrene: Secondary | ICD-10-CM | POA: Diagnosis not present

## 2022-02-10 DIAGNOSIS — R918 Other nonspecific abnormal finding of lung field: Secondary | ICD-10-CM | POA: Diagnosis not present

## 2022-02-10 DIAGNOSIS — R911 Solitary pulmonary nodule: Secondary | ICD-10-CM

## 2022-02-10 MED ORDER — IOHEXOL 300 MG/ML  SOLN
100.0000 mL | Freq: Once | INTRAMUSCULAR | Status: AC | PRN
  Administered 2022-02-10: 80 mL via INTRAVENOUS

## 2022-02-10 NOTE — Progress Notes (Signed)
Please let the patient know that the chest Ct showed stability of the pulmonary nodules. Recommendation is to repeat in 18 months. We can consider repeat in 12 months. Again, the large hiatal hernia is noted and plaque in the aorta.  Thanks so much.   -HB

## 2022-02-27 DIAGNOSIS — E039 Hypothyroidism, unspecified: Secondary | ICD-10-CM | POA: Diagnosis not present

## 2022-03-02 DIAGNOSIS — H40013 Open angle with borderline findings, low risk, bilateral: Secondary | ICD-10-CM | POA: Diagnosis not present

## 2022-03-09 DIAGNOSIS — R5383 Other fatigue: Secondary | ICD-10-CM | POA: Diagnosis not present

## 2022-03-09 DIAGNOSIS — G479 Sleep disorder, unspecified: Secondary | ICD-10-CM | POA: Diagnosis not present

## 2022-03-09 DIAGNOSIS — E039 Hypothyroidism, unspecified: Secondary | ICD-10-CM | POA: Diagnosis not present

## 2022-03-09 DIAGNOSIS — H04123 Dry eye syndrome of bilateral lacrimal glands: Secondary | ICD-10-CM | POA: Diagnosis not present

## 2022-03-24 DIAGNOSIS — N951 Menopausal and female climacteric states: Secondary | ICD-10-CM | POA: Diagnosis not present

## 2022-03-27 DIAGNOSIS — E039 Hypothyroidism, unspecified: Secondary | ICD-10-CM | POA: Diagnosis not present

## 2022-03-27 DIAGNOSIS — Z6830 Body mass index (BMI) 30.0-30.9, adult: Secondary | ICD-10-CM | POA: Diagnosis not present

## 2022-03-27 DIAGNOSIS — G479 Sleep disorder, unspecified: Secondary | ICD-10-CM | POA: Diagnosis not present

## 2022-03-27 DIAGNOSIS — R5383 Other fatigue: Secondary | ICD-10-CM | POA: Diagnosis not present

## 2022-03-27 DIAGNOSIS — N951 Menopausal and female climacteric states: Secondary | ICD-10-CM | POA: Diagnosis not present

## 2022-04-13 DIAGNOSIS — H40033 Anatomical narrow angle, bilateral: Secondary | ICD-10-CM | POA: Diagnosis not present

## 2022-04-13 DIAGNOSIS — H40013 Open angle with borderline findings, low risk, bilateral: Secondary | ICD-10-CM | POA: Diagnosis not present

## 2022-04-13 DIAGNOSIS — H0102B Squamous blepharitis left eye, upper and lower eyelids: Secondary | ICD-10-CM | POA: Diagnosis not present

## 2022-04-13 DIAGNOSIS — H25813 Combined forms of age-related cataract, bilateral: Secondary | ICD-10-CM | POA: Diagnosis not present

## 2022-04-13 DIAGNOSIS — H0102A Squamous blepharitis right eye, upper and lower eyelids: Secondary | ICD-10-CM | POA: Diagnosis not present

## 2022-06-05 DIAGNOSIS — Z6829 Body mass index (BMI) 29.0-29.9, adult: Secondary | ICD-10-CM | POA: Diagnosis not present

## 2022-06-05 DIAGNOSIS — Z01419 Encounter for gynecological examination (general) (routine) without abnormal findings: Secondary | ICD-10-CM | POA: Diagnosis not present

## 2022-06-05 DIAGNOSIS — F5104 Psychophysiologic insomnia: Secondary | ICD-10-CM | POA: Diagnosis not present

## 2022-06-05 DIAGNOSIS — Z1231 Encounter for screening mammogram for malignant neoplasm of breast: Secondary | ICD-10-CM | POA: Diagnosis not present

## 2022-06-05 LAB — HM MAMMOGRAPHY

## 2022-06-22 DIAGNOSIS — N951 Menopausal and female climacteric states: Secondary | ICD-10-CM | POA: Diagnosis not present

## 2022-06-22 DIAGNOSIS — E039 Hypothyroidism, unspecified: Secondary | ICD-10-CM | POA: Diagnosis not present

## 2022-06-24 DIAGNOSIS — Z79890 Hormone replacement therapy (postmenopausal): Secondary | ICD-10-CM | POA: Diagnosis not present

## 2022-06-24 DIAGNOSIS — G479 Sleep disorder, unspecified: Secondary | ICD-10-CM | POA: Diagnosis not present

## 2022-06-24 DIAGNOSIS — M3500 Sicca syndrome, unspecified: Secondary | ICD-10-CM | POA: Diagnosis not present

## 2022-06-24 DIAGNOSIS — M2550 Pain in unspecified joint: Secondary | ICD-10-CM | POA: Diagnosis not present

## 2022-06-24 DIAGNOSIS — R6882 Decreased libido: Secondary | ICD-10-CM | POA: Diagnosis not present

## 2022-06-24 DIAGNOSIS — Z6830 Body mass index (BMI) 30.0-30.9, adult: Secondary | ICD-10-CM | POA: Diagnosis not present

## 2022-06-24 DIAGNOSIS — N951 Menopausal and female climacteric states: Secondary | ICD-10-CM | POA: Diagnosis not present

## 2022-06-24 DIAGNOSIS — H04123 Dry eye syndrome of bilateral lacrimal glands: Secondary | ICD-10-CM | POA: Diagnosis not present

## 2022-06-24 DIAGNOSIS — E039 Hypothyroidism, unspecified: Secondary | ICD-10-CM | POA: Diagnosis not present

## 2022-09-21 DIAGNOSIS — E039 Hypothyroidism, unspecified: Secondary | ICD-10-CM | POA: Diagnosis not present

## 2022-09-21 DIAGNOSIS — N951 Menopausal and female climacteric states: Secondary | ICD-10-CM | POA: Diagnosis not present

## 2022-09-23 DIAGNOSIS — E039 Hypothyroidism, unspecified: Secondary | ICD-10-CM | POA: Diagnosis not present

## 2022-09-23 DIAGNOSIS — G479 Sleep disorder, unspecified: Secondary | ICD-10-CM | POA: Diagnosis not present

## 2022-09-23 DIAGNOSIS — N898 Other specified noninflammatory disorders of vagina: Secondary | ICD-10-CM | POA: Diagnosis not present

## 2022-09-23 DIAGNOSIS — N951 Menopausal and female climacteric states: Secondary | ICD-10-CM | POA: Diagnosis not present

## 2022-09-23 DIAGNOSIS — Z79890 Hormone replacement therapy (postmenopausal): Secondary | ICD-10-CM | POA: Diagnosis not present

## 2022-09-23 DIAGNOSIS — Z6831 Body mass index (BMI) 31.0-31.9, adult: Secondary | ICD-10-CM | POA: Diagnosis not present

## 2022-09-28 ENCOUNTER — Other Ambulatory Visit: Payer: Self-pay | Admitting: Family Medicine

## 2022-09-28 DIAGNOSIS — R7303 Prediabetes: Secondary | ICD-10-CM

## 2022-09-28 DIAGNOSIS — E785 Hyperlipidemia, unspecified: Secondary | ICD-10-CM

## 2022-09-28 DIAGNOSIS — E039 Hypothyroidism, unspecified: Secondary | ICD-10-CM

## 2022-09-28 DIAGNOSIS — D508 Other iron deficiency anemias: Secondary | ICD-10-CM

## 2022-09-28 DIAGNOSIS — Z0000 Encounter for general adult medical examination without abnormal findings: Secondary | ICD-10-CM

## 2022-10-01 ENCOUNTER — Other Ambulatory Visit: Payer: PPO

## 2022-10-01 DIAGNOSIS — R7303 Prediabetes: Secondary | ICD-10-CM | POA: Diagnosis not present

## 2022-10-01 DIAGNOSIS — D508 Other iron deficiency anemias: Secondary | ICD-10-CM | POA: Diagnosis not present

## 2022-10-01 DIAGNOSIS — Z0000 Encounter for general adult medical examination without abnormal findings: Secondary | ICD-10-CM

## 2022-10-01 DIAGNOSIS — E785 Hyperlipidemia, unspecified: Secondary | ICD-10-CM

## 2022-10-01 DIAGNOSIS — E039 Hypothyroidism, unspecified: Secondary | ICD-10-CM | POA: Diagnosis not present

## 2022-10-02 LAB — CBC WITH DIFFERENTIAL/PLATELET
Basophils Absolute: 0 10*3/uL (ref 0.0–0.2)
Basos: 1 %
EOS (ABSOLUTE): 0.1 10*3/uL (ref 0.0–0.4)
Eos: 3 %
Hematocrit: 43 % (ref 34.0–46.6)
Hemoglobin: 14.7 g/dL (ref 11.1–15.9)
Immature Grans (Abs): 0 10*3/uL (ref 0.0–0.1)
Immature Granulocytes: 0 %
Lymphocytes Absolute: 1.5 10*3/uL (ref 0.7–3.1)
Lymphs: 30 %
MCH: 32.4 pg (ref 26.6–33.0)
MCHC: 34.2 g/dL (ref 31.5–35.7)
MCV: 95 fL (ref 79–97)
Monocytes Absolute: 0.5 10*3/uL (ref 0.1–0.9)
Monocytes: 10 %
Neutrophils Absolute: 2.9 10*3/uL (ref 1.4–7.0)
Neutrophils: 56 %
Platelets: 335 10*3/uL (ref 150–450)
RBC: 4.54 x10E6/uL (ref 3.77–5.28)
RDW: 13.2 % (ref 11.7–15.4)
WBC: 5.1 10*3/uL (ref 3.4–10.8)

## 2022-10-02 LAB — COMPREHENSIVE METABOLIC PANEL
ALT: 18 IU/L (ref 0–32)
AST: 20 IU/L (ref 0–40)
Albumin: 4.6 g/dL (ref 3.9–4.9)
Alkaline Phosphatase: 64 IU/L (ref 44–121)
BUN/Creatinine Ratio: 23 (ref 12–28)
BUN: 23 mg/dL (ref 8–27)
Bilirubin Total: 0.4 mg/dL (ref 0.0–1.2)
CO2: 21 mmol/L (ref 20–29)
Calcium: 9.4 mg/dL (ref 8.7–10.3)
Chloride: 98 mmol/L (ref 96–106)
Creatinine, Ser: 1 mg/dL (ref 0.57–1.00)
Globulin, Total: 2.2 g/dL (ref 1.5–4.5)
Glucose: 99 mg/dL (ref 70–99)
Potassium: 4.6 mmol/L (ref 3.5–5.2)
Sodium: 135 mmol/L (ref 134–144)
Total Protein: 6.8 g/dL (ref 6.0–8.5)
eGFR: 61 mL/min/{1.73_m2} (ref >59)

## 2022-10-02 LAB — LIPID PANEL
Chol/HDL Ratio: 3.2 ratio (ref 0.0–4.4)
Cholesterol, Total: 239 mg/dL — ABNORMAL HIGH (ref 100–199)
HDL: 74 mg/dL (ref >39)
LDL Chol Calc (NIH): 149 mg/dL — ABNORMAL HIGH (ref 0–99)
Triglycerides: 94 mg/dL (ref 0–149)
VLDL Cholesterol Cal: 16 mg/dL (ref 5–40)

## 2022-10-02 LAB — HEMOGLOBIN A1C
Est. average glucose Bld gHb Est-mCnc: 126 mg/dL
Hgb A1c MFr Bld: 6 % — ABNORMAL HIGH (ref 4.8–5.6)

## 2022-10-08 ENCOUNTER — Encounter: Payer: Self-pay | Admitting: Family Medicine

## 2022-10-08 ENCOUNTER — Ambulatory Visit (INDEPENDENT_AMBULATORY_CARE_PROVIDER_SITE_OTHER): Payer: PPO | Admitting: Family Medicine

## 2022-10-08 VITALS — BP 126/80 | HR 66 | Ht 66.5 in | Wt 200.8 lb

## 2022-10-08 DIAGNOSIS — Z0000 Encounter for general adult medical examination without abnormal findings: Secondary | ICD-10-CM | POA: Diagnosis not present

## 2022-10-08 DIAGNOSIS — R7303 Prediabetes: Secondary | ICD-10-CM | POA: Insufficient documentation

## 2022-10-08 DIAGNOSIS — K449 Diaphragmatic hernia without obstruction or gangrene: Secondary | ICD-10-CM | POA: Insufficient documentation

## 2022-10-08 DIAGNOSIS — E7800 Pure hypercholesterolemia, unspecified: Secondary | ICD-10-CM

## 2022-10-08 HISTORY — DX: Pure hypercholesterolemia, unspecified: E78.00

## 2022-10-08 NOTE — Progress Notes (Signed)
Complete physical exam  Patient: Stacy Boyd   DOB: Feb 10, 1955   67 y.o. Female  MRN: [id number]  Subjective:    Chief Complaint  Patient presents with   Annual Exam    Stacy Boyd is a 67 y.o. female who presents today for a complete physical exam. She reports consuming a general diet.  She generally feels well. She reports sleeping fairly well. She does not have additional problems to discuss today.  She has followed every 3 months by [facility] MD.  They manage her thyroid medication and check thyroid levels and manage hormone therapy.   Most recent fall risk assessment:    10/08/2022    3:24 PM  Fall Risk   Falls in the past year? 0  Number falls in past yr: 0  Injury with Fall? 0  Risk for fall due to : No Fall Risks  Follow up Falls evaluation completed     Most recent depression and anxiety screenings:    10/08/2022    3:24 PM 10/20/2021    8:29 AM  PHQ 2/9 Scores  PHQ - 2 Score 0 0  PHQ- 9 Score 1 5      10/20/2021    8:30 AM 10/15/2020    9:38 AM  GAD 7 : Generalized Anxiety Score  Nervous, Anxious, on Edge 0 0  Control/stop worrying 0 0  Worry too much - different things 0 0  Trouble relaxing 0 0  Restless 0 1  Easily annoyed or irritable 0 0  Afraid - awful might happen 0 0  Total GAD 7 Score 0 1  Anxiety Difficulty Not difficult at all     Patient Active Problem List   Diagnosis Date Noted   Hypercholesteremia 10/08/2022   Prediabetes 10/08/2022   Hiatal hernia 10/08/2022   Obesity 04/03/2019   NSAID long-term use 02/16/2017   Rheumatoid arthritis (HCC) 11/24/2016   Low testosterone level in female 11/24/2016   Family history of colon cancer 08/31/2014   Hypothyroidism 05/07/2010   Osteopenia 05/07/2010   Rosacea 05/07/2010   Sjogren's disease (HCC) 05/07/2010    Past Surgical History:  Procedure Laterality Date   ABDOMINAL HYSTERECTOMY     CESAREAN SECTION     COLONOSCOPY  2016   at [facility]   EYE SURGERY  1999   Lasix    JOINT REPLACEMENT  Nov 2021 & Feb 2022   Right knee Nov, Left knee Feb   REFRACTIVE SURGERY     TOTAL KNEE ARTHROPLASTY Right 11/20/2019   Procedure: TOTAL KNEE ARTHROPLASTY;  Surgeon: Ollen Gross, MD;  Location: [facility];  Service: Orthopedics;  Laterality: Right;    TOTAL KNEE ARTHROPLASTY Left 03/04/2020   Procedure: TOTAL KNEE ARTHROPLASTY;  Surgeon: Ollen Gross, MD;  Location: [facility];  Service: Orthopedics;  Laterality: Left;    Social History   Tobacco Use   Smoking status: Never   Smokeless tobacco: Never  Vaping Use   Vaping status: Never Used  Substance Use Topics   Alcohol use: Yes    Alcohol/week: 2.0 standard drinks of alcohol    Types: 2 Standard drinks or equivalent per week   Drug use: No   Family History  Problem Relation Age of Onset   Colon cancer Father 76   Cancer Father    Heart disease Father    Breast cancer Sister    Diabetes Sister    Prostate cancer Brother    Diabetes Brother    Hearing loss  Brother    Diabetes Mother    Hearing loss Mother    Hypertension Mother    Stroke Mother    Cancer Sister    Early death Sister    Stomach cancer Neg Hx    Rectal cancer Neg Hx    Pancreatic cancer Neg Hx    Esophageal cancer Neg Hx    Allergies  Allergen Reactions   Erythromycin Hives, Nausea Only and Rash     Patient Care Team: Melida Quitter, PA as PCP - General (Family Medicine) Rossie Muskrat, MD (Rheumatology) Marcelle Overlie, MD as Consulting Physician (Obstetrics and Gynecology) Rachael Fee, MD as Attending Physician (Gastroenterology) Charlott Rakes, MD as Consulting Physician (Gastroenterology)   Outpatient Medications Prior to Visit  Medication Sig   Calcium-Phosphorus-Vitamin D (CITRACAL +D3 PO)    cetirizine (ZYRTEC) 10 MG tablet Take 10 mg by mouth daily as needed for allergies.   Cholecalciferol (VITAMIN D-3) 125 MCG (5000 UT) TABS Take 5,000 Units by mouth daily.   Coenzyme Q10 (COQ10) 100 MG  CAPS    famotidine-calcium carbonate-magnesium hydroxide (PEPCID COMPLETE) 10-800-165 MG chewable tablet Chew 1 tablet by mouth at bedtime. Chewable   levothyroxine (SYNTHROID) 75 MCG tablet 1 tablet in the morning on an empty stomach Orally Once a day for 90 days   liothyronine (CYTOMEL) 5 MCG tablet 2 tablets Orally Once a day for 90 days   Multiple Vitamins-Minerals (CENTRUM SILVER 50+WOMEN PO) Take 1 tablet daily by mouth.   Omega-3 Fatty Acids (FISH OIL ULTRA) 1400 MG CAPS Take 1,400 mg by mouth daily.   Polyethyl Glycol-Propyl Glycol 0.4-0.3 % SOLN Place 1 drop into both eyes 4 (four) times daily as needed (Dry eyes).   progesterone (PROMETRIUM) 100 MG capsule 100 mg.   SPIRULINA PO Take 3,000 mg by mouth daily.   TURMERIC PO Take 2,000 mg by mouth daily.   [DISCONTINUED] cycloSPORINE (RESTASIS) 0.05 % ophthalmic emulsion    [DISCONTINUED] Eyelid Cleansers (THERATEARS STERILID CLEANSER EX) Apply 1 application topically 2 (two) times daily.   [DISCONTINUED] Testosterone Undecanoate 150 MG CAPS 150 mg 1 day or 1 dose. Pellet inplant   [DISCONTINUED] Thyroid (NATURE-THROID PO) COMPOUNDED MEDICATION  LIOTHYRONINE SODIUM (T3)/THYROXINE-L(T4) SR 30-100MCG CAPSULE  Take 1 capsule by mouth in the morning on an empty stomach.   No facility-administered medications prior to visit.    Review of Systems  Constitutional:  Negative for chills, fever and malaise/fatigue.  HENT:  Negative for congestion and hearing loss.   Eyes:  Negative for blurred vision and double vision.  Respiratory:  Negative for cough and shortness of breath.   Cardiovascular:  Negative for chest pain, palpitations and leg swelling.  Gastrointestinal:  Negative for abdominal pain, constipation, diarrhea and heartburn.  Genitourinary:  Negative for frequency and urgency.  Musculoskeletal:  Negative for myalgias and neck pain.  Neurological:  Negative for headaches.  Endo/Heme/Allergies:  Negative for polydipsia.   Psychiatric/Behavioral:  Negative for depression. The patient is not nervous/anxious and does not have insomnia.       Objective:    BP 126/80   Pulse 66   Ht 5' 6.5" (1.689 m)   Wt 200 lb 12.8 oz (91.1 kg)   SpO2 95%   BMI 31.92 kg/m    Physical Exam Constitutional:      General: She is not in acute distress.    Appearance: Normal appearance.  HENT:     Head: Normocephalic and atraumatic.     Right Ear: Tympanic membrane,  ear canal and external ear normal. There is no impacted cerumen.     Left Ear: Tympanic membrane, ear canal and external ear normal. There is no impacted cerumen.     Nose: Nose normal.     Mouth/Throat:     Mouth: Mucous membranes are moist.     Pharynx: No oropharyngeal exudate or posterior oropharyngeal erythema.  Eyes:     Extraocular Movements: Extraocular movements intact.     Conjunctiva/sclera: Conjunctivae normal.     Pupils: Pupils are equal, round, and reactive to light.     Comments: Wearing glasses, prescription up-to-date  Neck:     Thyroid: No thyroid mass, thyromegaly or thyroid tenderness.  Cardiovascular:     Rate and Rhythm: Normal rate and regular rhythm.     Heart sounds: Normal heart sounds. No murmur heard.    No friction rub. No gallop.  Pulmonary:     Effort: Pulmonary effort is normal. No respiratory distress.     Breath sounds: Normal breath sounds. No wheezing, rhonchi or rales.  Abdominal:     General: Abdomen is flat. Bowel sounds are normal. There is no distension.     Palpations: There is no mass.     Tenderness: There is no abdominal tenderness. There is no guarding.  Musculoskeletal:        General: Normal range of motion.     Cervical back: Normal range of motion and neck supple.  Lymphadenopathy:     Cervical: No cervical adenopathy.  Skin:    General: Skin is warm and dry.  Neurological:     Mental Status: She is alert and oriented to person, place, and time.     Cranial Nerves: No cranial nerve deficit.      Motor: No weakness.     Deep Tendon Reflexes: Reflexes normal.  Psychiatric:        Mood and Affect: Mood normal.       Assessment & Plan:    Routine Health Maintenance and Physical Exam  Immunization History  Administered Date(s) Administered   Fluad Quad(high Dose 65+) 10/15/2020, 09/09/2022   Influenza, High Dose Seasonal PF 10/11/2019   Influenza-Unspecified 10/02/2021   MMR 01/14/1957, 02[birth year]13[birth year]1955   Moderna Sars-Covid-2 Vaccination 02/20/2019, 03/13/2019   PPD Test 01/13/1979   Pneumococcal Conjugate-13 02/25/2018   Pneumococcal-Unspecified 01/13/2003, 01/13/2008   Td 01/13/2007   Td (Adult), 2 Lf Tetanus Toxid, Preservative Free 01/13/2007   Tdap 01/13/2007, 01/12/2010, 11/26/2017   Unspecified SARS-COV-2 Vaccination 03/06/2019, 10/27/2019, 06/01/2020, 09/20/2020, 09/26/2020, 10/02/2021   Zoster Recombinant(Shingrix) 11/26/2017, 02/25/2018   Zoster, Live 08/29/2013, 02/25/2018    Health Maintenance  Topic Date Due   Pneumonia Vaccine 67+ Years old (2 of 2 - PPSV23 or PCV20) 02/26/2019   MAMMOGRAM  05/23/2022   Medicare Annual Wellness (AWV)  10/21/2022   COVID-19 Vaccine (9 - 2023-24 season) 10/24/2022 (Originally 09/13/2022)   DTaP/Tdap/Td (6 - Td or Tdap) 11/27/2027   Colonoscopy  10/09/2031   INFLUENZA VACCINE  Completed   DEXA SCAN  Completed   Hepatitis C Screening  Completed   Zoster Vaccines- Shingrix  Completed   HPV VACCINES  Aged Out    Reviewed most recent labs including CBC, CMP, lipid panel, A1C, TSH, and vitamin D. All within normal limits/stable from last check other than LDL elevated at 149.  Previous CT calcium score of 0 indicates that she does not need statin or aspirin therapy.  Discussed health benefits of physical activity, and encouraged her to engage in regular exercise  appropriate for her age and condition.  Wellness examination  Hypercholesteremia Assessment & Plan: Last lipid panel: LDL 149, HDL 74, triglycerides 94.  LDL has slightly  improved from 159 a year ago. The 10-year ASCVD risk score (Arnett DK, et al., 2019) is: 9.7%. Previous CT calcium score of 0 in 2023 indicates that she does not need statin or aspirin therapy.  Reassess in 2028.   Prediabetes Assessment & Plan: A1c stable at 6.0.  Continue making efforts to get routine physical activity and limit sugar/carbs.  Will continue to monitor.     Return in about 1 year (around 10/08/2023) for annual physical, fasting blood work 1 week before.     Melida Quitter, PA

## 2022-10-08 NOTE — Assessment & Plan Note (Addendum)
Last lipid panel: LDL 149, HDL 74, triglycerides 94.  LDL has slightly improved from 159 a year ago. The 10-year ASCVD risk score (Arnett DK, et al., 2019) is: 9.7%. Previous CT calcium score of 0 in 2023 indicates that she does not need statin or aspirin therapy.  Reassess in 2028.

## 2022-10-08 NOTE — Assessment & Plan Note (Addendum)
A1c stable at 6.0.  Continue making efforts to get routine physical activity and limit sugar/carbs.  Will continue to monitor.

## 2022-10-08 NOTE — Patient Instructions (Addendum)
If you do decide that you would like to be more aggressive for your cholesterol levels with a statin, we can do so.  Given that your score on the CT scan was 0 it is not a requirement though!  You are also eligible for the pneumonia PCV 20 vaccine at the pharmacy.

## 2022-10-22 ENCOUNTER — Ambulatory Visit: Payer: PPO

## 2022-10-23 ENCOUNTER — Ambulatory Visit: Payer: PPO | Admitting: Nurse Practitioner

## 2022-11-15 ENCOUNTER — Other Ambulatory Visit: Payer: Self-pay | Admitting: Medical Genetics

## 2022-11-15 DIAGNOSIS — Z006 Encounter for examination for normal comparison and control in clinical research program: Secondary | ICD-10-CM

## 2022-11-16 DIAGNOSIS — R768 Other specified abnormal immunological findings in serum: Secondary | ICD-10-CM | POA: Diagnosis not present

## 2022-11-16 DIAGNOSIS — M1990 Unspecified osteoarthritis, unspecified site: Secondary | ICD-10-CM | POA: Diagnosis not present

## 2022-11-16 DIAGNOSIS — M810 Age-related osteoporosis without current pathological fracture: Secondary | ICD-10-CM | POA: Diagnosis not present

## 2022-11-16 DIAGNOSIS — Z23 Encounter for immunization: Secondary | ICD-10-CM | POA: Diagnosis not present

## 2022-11-16 DIAGNOSIS — M3500 Sicca syndrome, unspecified: Secondary | ICD-10-CM | POA: Diagnosis not present

## 2022-12-11 ENCOUNTER — Other Ambulatory Visit (HOSPITAL_COMMUNITY)
Admission: RE | Admit: 2022-12-11 | Discharge: 2022-12-11 | Disposition: A | Discharge status: Home / Self Care | Payer: PPO | Source: Ambulatory Visit | Attending: Oncology | Admitting: Oncology

## 2022-12-11 DIAGNOSIS — Z006 Encounter for examination for normal comparison and control in clinical research program: Secondary | ICD-10-CM | POA: Insufficient documentation

## 2022-12-21 LAB — GENECONNECT MOLECULAR SCREEN: Genetic Analysis Overall Interpretation: NEGATIVE

## 2022-12-29 DIAGNOSIS — E039 Hypothyroidism, unspecified: Secondary | ICD-10-CM | POA: Diagnosis not present

## 2022-12-29 DIAGNOSIS — N951 Menopausal and female climacteric states: Secondary | ICD-10-CM | POA: Diagnosis not present

## 2022-12-31 DIAGNOSIS — R6882 Decreased libido: Secondary | ICD-10-CM | POA: Diagnosis not present

## 2022-12-31 DIAGNOSIS — R5383 Other fatigue: Secondary | ICD-10-CM | POA: Diagnosis not present

## 2022-12-31 DIAGNOSIS — Z79890 Hormone replacement therapy (postmenopausal): Secondary | ICD-10-CM | POA: Diagnosis not present

## 2022-12-31 DIAGNOSIS — M3500 Sicca syndrome, unspecified: Secondary | ICD-10-CM | POA: Diagnosis not present

## 2022-12-31 DIAGNOSIS — Z6832 Body mass index (BMI) 32.0-32.9, adult: Secondary | ICD-10-CM | POA: Diagnosis not present

## 2022-12-31 DIAGNOSIS — R232 Flushing: Secondary | ICD-10-CM | POA: Diagnosis not present

## 2022-12-31 DIAGNOSIS — G479 Sleep disorder, unspecified: Secondary | ICD-10-CM | POA: Diagnosis not present

## 2022-12-31 DIAGNOSIS — E039 Hypothyroidism, unspecified: Secondary | ICD-10-CM | POA: Diagnosis not present

## 2022-12-31 DIAGNOSIS — N951 Menopausal and female climacteric states: Secondary | ICD-10-CM | POA: Diagnosis not present

## 2022-12-31 DIAGNOSIS — N898 Other specified noninflammatory disorders of vagina: Secondary | ICD-10-CM | POA: Diagnosis not present

## 2023-01-14 ENCOUNTER — Encounter: Payer: Self-pay | Admitting: Family Medicine

## 2023-01-14 ENCOUNTER — Ambulatory Visit: Payer: PPO

## 2023-02-18 ENCOUNTER — Encounter: Payer: Self-pay | Admitting: Family Medicine

## 2023-02-18 ENCOUNTER — Ambulatory Visit: Payer: PPO

## 2023-02-18 DIAGNOSIS — Z0000 Encounter for general adult medical examination without abnormal findings: Secondary | ICD-10-CM

## 2023-02-18 NOTE — Patient Instructions (Signed)
 Stacy Boyd , Thank you for taking time to come for your Medicare Wellness Visit. I appreciate your ongoing commitment to your health goals. Please review the following plan we discussed and let me know if I can assist you in the future.   Referrals/Orders/Follow-Ups/Clinician Recommendations: none  This is a list of the screening recommended for you and due dates:  Health Maintenance  Topic Date Due   Medicare Annual Wellness Visit  02/18/2024   Mammogram  06/04/2024   DTaP/Tdap/Td vaccine (6 - Td or Tdap) 11/27/2027   Colon Cancer Screening  10/09/2031   Pneumonia Vaccine  Completed   Flu Shot  Completed   DEXA scan (bone density measurement)  Completed   COVID-19 Vaccine  Completed   Hepatitis C Screening  Completed   Zoster (Shingles) Vaccine  Completed   HPV Vaccine  Aged Out    Advanced directives: (ACP Link)Information on Advanced Care Planning can be found at [place]  Secretary of State Advance Health Care Directives Advance Health Care Directives ([email or web address]   Next Medicare Annual Wellness Visit scheduled for next year: Yes  insert Preventive Care attachment Insert FALL PREVENTION attachment if needed

## 2023-02-18 NOTE — Progress Notes (Signed)
 Subjective:   Stacy Boyd is a 68 y.o. female who presents for Medicare Annual (Subsequent) preventive examination.  Visit Complete: Virtual I connected with  Stacy Boyd on 02/18/23 by a audio enabled telemedicine application and verified that I am speaking with the correct person using two identifiers.  Patient Location: Home  Provider Location: Office/Clinic  I discussed the limitations of evaluation and management by telemedicine. The patient expressed understanding and agreed to proceed.  Vital Signs: Because this visit was a virtual/telehealth visit, some criteria may be missing or patient reported. Any vitals not documented were not able to be obtained and vitals that have been documented are patient reported.    Cardiac Risk Factors include: advanced age (>1men, >28 women)     Objective:    Today's Vitals   There is no height or weight on file to calculate BMI.     02/18/2023   10:54 AM 03/04/2020    2:47 PM 03/04/2020    2:43 PM 02/23/2020   10:25 AM 11/20/2019   11:04 AM 11/14/2019   11:34 AM  Advanced Directives  Does Patient Have a Medical Advance Directive? No  No No No No  Would patient like information on creating a medical advance directive? No - Patient declined No - Patient declined   No - Patient declined Yes (MAU/Ambulatory/Procedural Areas - Information given)    Current Medications (verified) Outpatient Encounter Medications as of 02/18/2023  Medication Sig   Calcium-Phosphorus-Vitamin D  (CITRACAL +D3 PO)    cetirizine (ZYRTEC) 10 MG tablet Take 10 mg by mouth daily as needed for allergies.   Cholecalciferol (VITAMIN D -3) 125 MCG (5000 UT) TABS Take 5,000 Units by mouth daily.   Coenzyme Q10 (COQ10) 100 MG CAPS    famotidine -calcium carbonate-magnesium  hydroxide (PEPCID  COMPLETE) 10-800-165 MG chewable tablet Chew 1 tablet by mouth at bedtime. Chewable   levothyroxine  (SYNTHROID ) 75 MCG tablet 1 tablet in the morning on an empty  stomach Orally Once a day for 90 days   liothyronine  (CYTOMEL ) 5 MCG tablet 2 tablets Orally Once a day for 90 days   Multiple Vitamins-Minerals (CENTRUM SILVER 50+WOMEN PO) Take 1 tablet daily by mouth.   Omega-3 Fatty Acids (FISH OIL ULTRA) 1400 MG CAPS Take 1,400 mg by mouth daily.   Polyethyl Glycol-Propyl Glycol 0.4-0.3 % SOLN Place 1 drop into both eyes 4 (four) times daily as needed (Dry eyes).   progesterone  (PROMETRIUM ) 100 MG capsule 100 mg.   SPIRULINA PO Take 3,000 mg by mouth daily.   TURMERIC PO Take 2,000 mg by mouth daily.   No facility-administered encounter medications on file as of 02/18/2023.    Allergies (verified) Erythromycin   History: Past Medical History:  Diagnosis Date   Allergy 2020   seasonal   Anemia    Arthritis    Cataract 2020   Clotting disorder (HCC)    see chart   Depression    Currently not being treated   Dry eye    GERD (gastroesophageal reflux disease)    H. pylori infection    History of hiatal hernia    Hypercholesteremia 10/08/2022   Hypothyroid    Pneumonia    Pre-diabetes    Ulcer    see chart   Past Surgical History:  Procedure Laterality Date   ABDOMINAL HYSTERECTOMY     CESAREAN SECTION     COL[facility]OSCOPY  2016   at [facility]   EYE SURGERY  1999   Lasix   JOINT REPLACEMENT  Nov  2021 & Feb 2022   Right knee Nov, Left knee Feb   REFRACTIVE SURGERY     TOTAL KNEE ARTHROPLASTY Right 11/20/2019   Procedure: TOTAL KNEE ARTHROPLASTY;  Surgeon: Melodi Lerner, MD;  Location: [facility];  Service: Orthopedics;  Laterality: Right;    TOTAL KNEE ARTHROPLASTY Left 03/04/2020   Procedure: TOTAL KNEE ARTHROPLASTY;  Surgeon: Melodi Lerner, MD;  Location: [facility];  Service: Orthopedics;  Laterality: Left;    Family History  Problem Relation Age of Onset   Colon cancer Father 75   Cancer Father    Heart disease Father    Breast cancer Sister    Diabetes Sister    Hyperlipidemia Sister    Prostate cancer Brother    Diabetes  Brother    Hearing loss Brother    Hyperlipidemia Brother    Diabetes Mother    Hearing loss Mother    Hypertension Mother    Stroke Mother    Cancer Sister    Early death Sister    Stomach cancer Neg Hx    Rectal cancer Neg Hx    Pancreatic cancer Neg Hx    Esophageal cancer Neg Hx    Social History   Socioeconomic History   Marital status: Single    Spouse name: Not on file   Number of children: Not on file   Years of education: Not on file   Highest education level: Bachelor's degree (e.g., BA, AB, BS)  Occupational History   Occupation: Dance Movement Psychotherapist. Horticulturist, Commercial: PRODUCTION ASSISTANT, [employer] EMPL[employer]ED  Tobacco Use   Smoking status: Never   Smokeless tobacco: Never  Vaping Use   Vaping status: Never Used  Substance and Sexual Activity   Alcohol  use: Not Currently    Alcohol /week: 2.0 standard drinks of alcohol     Types: 2 Standard drinks or equivalent per week    Comment: on occasion   Drug use: Never   Sexual activity: Not Currently    Birth control/protection: Post-menopausal, None  Other Topics Concern   Not on file  Social History Narrative   Not on file   Social Drivers of Health   Financial Resource Strain: Low Risk  (02/18/2023)   Overall Financial Resource Strain (CARDIA)    Difficulty of Paying Living Expenses: Not hard at all  Food Insecurity: No Food Insecurity (02/18/2023)   Hunger Vital Sign    Worried About Running Out of Food in the Last Year: Never true    Ran Out of Food in the Last Year: Never true  Transportation Needs: No Transportation Needs (02/18/2023)   PRAPARE - Administrator, Civil Service (Medical): No    Lack of Transportation (Non-Medical): No  Physical Activity: Inactive (02/18/2023)   Exercise Vital Sign    Days of Exercise per Week: 0 days    Minutes of Exercise per Session: 0 min  Stress: No Stress Concern Present (02/18/2023)   [employer] of Occupational Health - Occupational Stress Questionnaire    Feeling  of Stress : Not at all  Social Connections: Moderately Integrated (02/18/2023)   Social Connection and Isolation Panel [NHANES]    Frequency of Communication with Friends and Family: More than three times a week    Frequency of Social Gatherings with Friends and Family: Twice a week    Attends Religious Services: More than 4 times per year    Active Member of [employer] or Organizations: Yes    Attends Banker Meetings:  More than 4 times per year    Marital Status: Divorced    Tobacco Counseling Counseling given: Not Answered   Clinical Intake:  Pre-visit preparation completed: Yes  Pain : No/denies pain     Nutritional Risks: None Diabetes: No  How often do you need to have someone help you when you read instructions, pamphlets, or other written materials from your doctor or pharmacy?: 1 - Never  Interpreter Needed?: No  Information entered by :: NAllen LPN   Activities of Daily Living    02/18/2023   10:47 AM  In your present state of health, do you have any difficulty performing the following activities:  Hearing? 0  Vision? 1  Comment chronic dry eye  Difficulty concentrating or making decisions? 0  Walking or climbing stairs? 1  Comment slowly  Dressing or bathing? 0  Doing errands, shopping? 0  Preparing Food and eating ? N  Using the Toilet? N  In the past six months, have you accidently leaked urine? N  Do you have problems with loss of bowel control? N  Managing your Medications? N  Managing your Finances? N  Housekeeping or managing your Housekeeping? N    Patient Care Team: Wallace Joesph LABOR, PA as PCP - General (Family Medicine) Syed, Tauseef G, MD (Rheumatology) Mat Browning, MD as Consulting Physician (Obstetrics and Gynecology) Teressa Toribio SQUIBB, MD (Inactive) as Attending Physician (Gastroenterology) Dianna Specking, MD as Consulting Physician (Gastroenterology)  Indicate any recent Medical Services you may have received from  other than [facility] providers in the past year (date may be approximate).     Assessment:   This is a routine wellness examination for Stacy Boyd.  Hearing/Vision screen Hearing Screening - Comments:: Denies hearing issues Vision Screening - Comments:: Regular eye exams, [facility]   Goals Addressed             This Visit's Progress    Patient Stated       02/18/2023, wants to get eyes in better shape, Wants to get hiatal hernia taken care of       Depression Screen    02/18/2023   10:55 AM 10/08/2022    3:24 PM 10/20/2021    8:29 AM 10/15/2020    9:38 AM 10/11/2019    1:54 PM 04/03/2019    9:26 AM 11/12/2017    8:25 AM  PHQ 2/9 Scores  PHQ - 2 Score 0 0 0 0  0 0  PHQ- 9 Score 4 1 5  0  4 3  Exception Documentation     Patient refusal      Fall Risk    02/18/2023   10:55 AM 10/08/2022    3:24 PM 10/20/2021    8:30 AM 10/15/2020    9:38 AM 10/11/2019    1:54 PM  Fall Risk   Falls in the past year? 0 0 0 1 0  Number falls in past yr: 0 0 0 0   Injury with Fall? 0 0 0 1   Risk for fall due to : Medication side effect No Fall Risks No Fall Risks    Follow up Falls prevention discussed;Falls evaluation completed Falls evaluation completed Falls evaluation completed Falls evaluation completed Falls evaluation completed    MEDICARE RISK AT HOME: Medicare Risk at Home Any stairs in or around the home?: Yes If so, are there any without handrails?: No Home free of loose throw rugs in walkways, pet beds, electrical cords, etc?: Yes Adequate lighting in your home to  reduce risk of falls?: Yes Life alert?: No Use of a cane, walker or w/c?: No Grab bars in the bathroom?: Yes Shower chair or bench in shower?: No Elevated toilet seat or a handicapped toilet?: Yes  TIMED UP AND GO:  Was the test performed?  No    Cognitive Function:        02/18/2023   10:57 AM 10/20/2021    8:29 AM 10/15/2020    9:39 AM 04/03/2019    9:30 AM  6CIT Screen  What Year? 0 points 0 points 0 points 0  points  What month? 0 points 0 points 0 points 0 points  What time? 0 points 0 points 0 points 0 points  Count back from 20 0 points 0 points 0 points 0 points  Months in reverse 0 points 0 points 0 points 0 points  Repeat phrase 0 points 0 points 0 points 0 points  Total Score 0 points 0 points 0 points 0 points    Immunizations Immunization History  Administered Date(s) Administered   Fluad Quad(high Dose 65+) 10/15/2020, 09/09/2022   Influenza, High Dose Seasonal PF 10/11/2019   Influenza-Unspecified 10/02/2021   MMR 01/14/1957, [birth year]13/1957   Moderna Sars-Covid-2 Vaccination 02/20/2019, 03/13/2019   PNEUMOCOCCAL CONJUGATE-20 11/16/2022   PPD Test 01/13/1979   Pneumococcal Conjugate-13 02/25/2018   Pneumococcal-Unspecified 01/13/2003, 01/13/2008   Td 01/13/2007   Td (Adult), 2 Lf Tetanus Toxid, Preservative Free 01/13/2007   Tdap 01/13/2007, 01/12/2010, 11/26/2017   Unspecified SARS-COV-2 Vaccination 03/06/2019, 10/27/2019, 06/01/2020, 09/20/2020, 09/26/2020, 10/02/2021, 09/09/2022   Zoster Recombinant(Shingrix ) 11/26/2017, 02/25/2018   Zoster, Live 08/29/2013, 02/25/2018    TDAP status: Up to date  Flu Vaccine status: Up to date  Pneumococcal vaccine status: Up to date  Covid-19 vaccine status: Completed vaccines  Qualifies for Shingles Vaccine? Yes   Zostavax completed Yes   Shingrix  Completed?: Yes  Screening Tests Health Maintenance  Topic Date Due   Medicare Annual Wellness (AWV)  02/18/2024   MAMMOGRAM  06/04/2024   DTaP/Tdap/Td (6 - Td or Tdap) 11/27/2027   Colonoscopy  10/09/2031   Pneumonia Vaccine 82+ Years old  Completed   INFLUENZA VACCINE  Completed   DEXA SCAN  Completed   COVID-19 Vaccine  Completed   Hepatitis C Screening  Completed   Zoster Vaccines- Shingrix   Completed   HPV VACCINES  Aged Out    Health Maintenance  There are no preventive care reminders to display for this patient.   Colorectal cancer screening: Type of screening:  Colonoscopy. Completed 10/08/2021. Repeat every 10 years  Mammogram status: Completed 06/05/2022. Repeat every year  Bone Density status: Completed 02/05/2014.   Lung Cancer Screening: (Low Dose CT Chest recommended if Age 67-80 years, 20 pack-year currently smoking OR have quit w/in 15years.) does not qualify.   Lung Cancer Screening Referral: no  Additional Screening:  Hepatitis C Screening: does qualify; Completed 04/05/2019  Vision Screening: Recommended annual ophthalmology exams for early detection of glaucoma and other disorders of the eye. Is the patient up to date with their annual eye exam?  Yes  Who is the provider or what is the name of the office in which the patient attends annual eye exams? [facility] If pt is not established with a provider, would they like to be referred to a provider to establish care? No .   Dental Screening: Recommended annual dental exams for proper oral hygiene  Diabetic Foot Exam: n/a  Community Resource Referral / Chronic Care Management: CRR required this visit?  No   CCM required this visit?  No     Plan:     I have personally reviewed and noted the following in the patient's chart:   Medical and social history Use of alcohol , tobacco or illicit drugs  Current medications and supplements including opioid prescriptions. Patient is not currently taking opioid prescriptions. Functional ability and status Nutritional status Physical activity Advanced directives List of other physicians Hospitalizations, surgeries, and ER visits in previous 12 months Vitals Screenings to include cognitive, depression, and falls Referrals and appointments  In addition, I have reviewed and discussed with patient certain preventive protocols, quality metrics, and best practice recommendations. A written personalized care plan for preventive services as well as general preventive health recommendations were provided to patient.     Ardella FORBES Dawn,  LPN   07/14/7972   After Visit Summary: (MyChart) Due to this being a telephonic visit, the after visit summary with patients personalized plan was offered to patient via MyChart   Nurse Notes: none

## 2023-03-29 DIAGNOSIS — N951 Menopausal and female climacteric states: Secondary | ICD-10-CM | POA: Diagnosis not present

## 2023-03-29 DIAGNOSIS — E039 Hypothyroidism, unspecified: Secondary | ICD-10-CM | POA: Diagnosis not present

## 2023-03-31 DIAGNOSIS — R6882 Decreased libido: Secondary | ICD-10-CM | POA: Diagnosis not present

## 2023-03-31 DIAGNOSIS — E039 Hypothyroidism, unspecified: Secondary | ICD-10-CM | POA: Diagnosis not present

## 2023-03-31 DIAGNOSIS — R5383 Other fatigue: Secondary | ICD-10-CM | POA: Diagnosis not present

## 2023-03-31 DIAGNOSIS — N898 Other specified noninflammatory disorders of vagina: Secondary | ICD-10-CM | POA: Diagnosis not present

## 2023-03-31 DIAGNOSIS — H04123 Dry eye syndrome of bilateral lacrimal glands: Secondary | ICD-10-CM | POA: Diagnosis not present

## 2023-03-31 DIAGNOSIS — N951 Menopausal and female climacteric states: Secondary | ICD-10-CM | POA: Diagnosis not present

## 2023-03-31 DIAGNOSIS — Z79890 Hormone replacement therapy (postmenopausal): Secondary | ICD-10-CM | POA: Diagnosis not present

## 2023-03-31 DIAGNOSIS — M2550 Pain in unspecified joint: Secondary | ICD-10-CM | POA: Diagnosis not present

## 2023-03-31 DIAGNOSIS — G479 Sleep disorder, unspecified: Secondary | ICD-10-CM | POA: Diagnosis not present

## 2023-04-19 DIAGNOSIS — H25813 Combined forms of age-related cataract, bilateral: Secondary | ICD-10-CM | POA: Diagnosis not present

## 2023-04-19 DIAGNOSIS — H40033 Anatomical narrow angle, bilateral: Secondary | ICD-10-CM | POA: Diagnosis not present

## 2023-04-19 DIAGNOSIS — H40013 Open angle with borderline findings, low risk, bilateral: Secondary | ICD-10-CM | POA: Diagnosis not present

## 2023-04-19 DIAGNOSIS — H0102B Squamous blepharitis left eye, upper and lower eyelids: Secondary | ICD-10-CM | POA: Diagnosis not present

## 2023-04-19 DIAGNOSIS — H0102A Squamous blepharitis right eye, upper and lower eyelids: Secondary | ICD-10-CM | POA: Diagnosis not present

## 2023-05-31 ENCOUNTER — Ambulatory Visit (INDEPENDENT_AMBULATORY_CARE_PROVIDER_SITE_OTHER): Admitting: Family Medicine

## 2023-05-31 VITALS — BP 137/82 | HR 80 | Ht 66.5 in | Wt 202.9 lb

## 2023-05-31 DIAGNOSIS — K449 Diaphragmatic hernia without obstruction or gangrene: Secondary | ICD-10-CM

## 2023-05-31 DIAGNOSIS — E039 Hypothyroidism, unspecified: Secondary | ICD-10-CM

## 2023-05-31 DIAGNOSIS — R5383 Other fatigue: Secondary | ICD-10-CM | POA: Diagnosis not present

## 2023-05-31 MED ORDER — PANTOPRAZOLE SODIUM 40 MG PO TBEC: 40.0000 mg | DELAYED_RELEASE_TABLET | Freq: Every day | ORAL | Status: DC

## 2023-05-31 NOTE — Progress Notes (Signed)
   Established Patient Office Visit  Subjective   Patient ID: Stacy Boyd, female    DOB: 01-13-1955  Age: 68 y.o. MRN: [id number]  Chief Complaint  Patient presents with   Annual Exam    Establish care    HPI  Subjective - Hiatal hernia: Asymptomatic for 5 years, worsening over past year - Cannot wear waistbands, must be careful with sitting for long periods - Experiences epigastric pain, burning, difficulty breathing, fatigue - Relief with burping - Taking OTC Pepcid  20mg  at night  Medications: Pepcid  20mg  at night, thyroid  medication managed by Marykay Snipes MD, hormone therapy (testosterone  pellets).  PMH, PSH, FH, Social Hx: Hiatal hernia diagnosed on endoscopy in 2019, H. pylori infection in 2019 (resolved after treatment), high cholesterol with high HDL (not on statins), cardiac CT calcium score of 0, lung nodules (follow-up showed no concern), complete hysterectomy including ovaries in mid-40s, Sjogren's syndrome (currently in remission), bilateral knee replacements in 2021 and 2022.  ROS: Decreased energy and endurance compared to last year, heat intolerance, difficulty sleeping (falls asleep easily but only sleeps 6 hours), no known snoring, Fitbit shows "low estimated oxygen variation."   The 10-year ASCVD risk score (Arnett DK, et al., 2019) is: 10.7%  Health Maintenance Due  Topic Date Due   COVID-19 Vaccine (10 - 2024-25 season) 03/12/2023      Objective:     BP 137/82   Pulse 80   Ht 5' 6.5" (1.689 m)   Wt 202 lb 14.4 oz (92 kg)   SpO2 97%   BMI 32.26 kg/m    Physical Exam Gen: alert, oriented Cv: rrr Pulm: lctab Gi: soft, nontender.  Nbs.    No results found for any visits on 05/31/23.      Assessment & Plan:   Other fatigue Assessment & Plan: Hypothyroidism is being ttreated.  Also gets hormonal treatment from [facility] incuding testosterone .  So these are less likely the cause.  Will get ferritin and cbc as well as cortisol level  given her hx of long term steroid use for autoimmune issues.   Orders: -     TSH; Future -     CBC; Future -     Iron, TIBC and Ferritin Panel; Future -     Cortisol; Future  Hiatal hernia Assessment & Plan: -  Symptoms of epigastric pain, burning, difficulty breathing - has known hiatal hernia seen on previous imaging and has had egd that showed h pylori infection in 2019. Discussed maximizing medical mgmt prior to pursuing surgical options.  -  Recommend follow-up with gastroenterologist for further workup and possible EGD -  Symptoms worsening over past year, significantly impacting quality of life -  Start pantoprazole  (Protonix ) 40mg  in morning, continue Pepcid  at night. After 8 weeks, taper pantoprazole  to 20mg  daily, then every other day, then stop    Hypothyroidism, unspecified type Assessment & Plan: Goes to [facility]' for mgmt.  Sees them every three months.  I do not hve access to their records.  Will get tsh when I get labs for fatigue   Other orders -     Pantoprazole  Sodium; Take 1 tablet (40 mg total) by mouth daily.     Return in about 2 months (around 07/31/2023) for hiatal hernia, fatigue.    Laneta Pintos, MD

## 2023-05-31 NOTE — Assessment & Plan Note (Signed)
 Hypothyroidism is being ttreated.  Also gets hormonal treatment from [facility] incuding testosterone .  So these are less likely the cause.  Will get ferritin and cbc as well as cortisol level given her hx of long term steroid use for autoimmune issues.

## 2023-05-31 NOTE — Patient Instructions (Signed)
 It was nice to see you today,  We addressed the following topics today: -I would like you to start taking 40 mg pantoprazole  (Protonix ) in the morning and continue taking your Pepcid  at night.  After 8 weeks you can start tapering off of the pantoprazole  by taking 20 mg/day and then every other day, then stopping completely. - I would like you to reach out to your gastroenterologist regarding further treatment and workup of your hiatal hernia now that your symptoms are worsening.  Have a great day,  Etha Henle, MD

## 2023-05-31 NOTE — Assessment & Plan Note (Addendum)
 Goes to '[facility] for mgmt.  Sees them every three months.  I do not hve access to their records.  Will get tsh when I get labs for fatigue

## 2023-05-31 NOTE — Assessment & Plan Note (Signed)
-    Symptoms of epigastric pain, burning, difficulty breathing - has known hiatal hernia seen on previous imaging and has had egd that showed h pylori infection in 2019. Discussed maximizing medical mgmt prior to pursuing surgical options.  -  Recommend follow-up with gastroenterologist for further workup and possible EGD -  Symptoms worsening over past year, significantly impacting quality of life -  Start pantoprazole  (Protonix ) 40mg  in morning, continue Pepcid  at night. After 8 weeks, taper pantoprazole  to 20mg  daily, then every other day, then stop

## 2023-06-02 ENCOUNTER — Other Ambulatory Visit: Payer: Self-pay | Admitting: Family Medicine

## 2023-06-02 MED ORDER — PANTOPRAZOLE SODIUM 40 MG PO TBEC: 40.0000 mg | DELAYED_RELEASE_TABLET | Freq: Every day | ORAL | Status: DC

## 2023-06-02 NOTE — Telephone Encounter (Signed)
 Copied from CRM [id number]. Topic: Clinical - Medication Refill >> Jun 02, 2023  8:21 AM Adaline Holly wrote: Medication: pantoprazole  (PROTONIX ) 40 MG tablet  Has the patient contacted their pharmacy? Yes  Patient states that she did not receive the RX at her visit. RX shows class print. Please resend RX to pharmacy and advise patient.  This is the patient's preferred pharmacy:  [facility] - [place], [place] - [address] [place] [address] Phone: [phone] Fax: [phone]  Is this the correct pharmacy for this prescription? Yes Has the patient been seen for an appointment in the last year OR does the patient have an upcoming appointment? Yes  Can we respond through MyChart? No

## 2023-06-10 ENCOUNTER — Other Ambulatory Visit: Payer: Self-pay | Admitting: *Deleted

## 2023-06-10 ENCOUNTER — Other Ambulatory Visit

## 2023-06-10 DIAGNOSIS — R5383 Other fatigue: Secondary | ICD-10-CM | POA: Diagnosis not present

## 2023-06-10 MED ORDER — PANTOPRAZOLE SODIUM 20 MG PO TBEC: DELAYED_RELEASE_TABLET | ORAL | 0 refills | Status: DC

## 2023-06-11 ENCOUNTER — Ambulatory Visit: Payer: Self-pay | Admitting: Family Medicine

## 2023-06-11 LAB — TSH: TSH: 2.07 u[IU]/mL (ref 0.450–4.500)

## 2023-06-11 LAB — CORTISOL: Cortisol: 10.7 ug/dL (ref 6.2–19.4)

## 2023-06-11 LAB — IRON,TIBC AND FERRITIN PANEL
Ferritin: 50 ng/mL (ref 15–150)
Iron Saturation: 29 % (ref 15–55)
Iron: 107 ug/dL (ref 27–139)
Total Iron Binding Capacity: 368 ug/dL (ref 250–450)
UIBC: 261 ug/dL (ref 118–369)

## 2023-06-11 LAB — CBC
Hematocrit: 47.1 % — ABNORMAL HIGH (ref 34.0–46.6)
Hemoglobin: 15.6 g/dL (ref 11.1–15.9)
MCH: 32.4 pg (ref 26.6–33.0)
MCHC: 33.1 g/dL (ref 31.5–35.7)
MCV: 98 fL — ABNORMAL HIGH (ref 79–97)
Platelets: 333 10*3/uL (ref 150–450)
RBC: 4.82 x10E6/uL (ref 3.77–5.28)
RDW: 13.2 % (ref 11.7–15.4)
WBC: 5.2 10*3/uL (ref 3.4–10.8)

## 2023-06-21 ENCOUNTER — Ambulatory Visit: Admitting: Family Medicine

## 2023-06-28 DIAGNOSIS — E039 Hypothyroidism, unspecified: Secondary | ICD-10-CM | POA: Diagnosis not present

## 2023-06-28 DIAGNOSIS — N951 Menopausal and female climacteric states: Secondary | ICD-10-CM | POA: Diagnosis not present

## 2023-07-01 DIAGNOSIS — R232 Flushing: Secondary | ICD-10-CM | POA: Diagnosis not present

## 2023-07-01 DIAGNOSIS — R6882 Decreased libido: Secondary | ICD-10-CM | POA: Diagnosis not present

## 2023-07-01 DIAGNOSIS — N951 Menopausal and female climacteric states: Secondary | ICD-10-CM | POA: Diagnosis not present

## 2023-07-01 DIAGNOSIS — Z79890 Hormone replacement therapy (postmenopausal): Secondary | ICD-10-CM | POA: Diagnosis not present

## 2023-07-01 DIAGNOSIS — K219 Gastro-esophageal reflux disease without esophagitis: Secondary | ICD-10-CM | POA: Diagnosis not present

## 2023-07-01 DIAGNOSIS — M2550 Pain in unspecified joint: Secondary | ICD-10-CM | POA: Diagnosis not present

## 2023-07-01 DIAGNOSIS — N898 Other specified noninflammatory disorders of vagina: Secondary | ICD-10-CM | POA: Diagnosis not present

## 2023-07-01 DIAGNOSIS — E039 Hypothyroidism, unspecified: Secondary | ICD-10-CM | POA: Diagnosis not present

## 2023-07-01 DIAGNOSIS — Z6832 Body mass index (BMI) 32.0-32.9, adult: Secondary | ICD-10-CM | POA: Diagnosis not present

## 2023-07-19 DIAGNOSIS — Z124 Encounter for screening for malignant neoplasm of cervix: Secondary | ICD-10-CM | POA: Diagnosis not present

## 2023-07-19 DIAGNOSIS — Z1231 Encounter for screening mammogram for malignant neoplasm of breast: Secondary | ICD-10-CM | POA: Diagnosis not present

## 2023-07-19 DIAGNOSIS — Z1272 Encounter for screening for malignant neoplasm of vagina: Secondary | ICD-10-CM | POA: Diagnosis not present

## 2023-07-19 DIAGNOSIS — Z6832 Body mass index (BMI) 32.0-32.9, adult: Secondary | ICD-10-CM | POA: Diagnosis not present

## 2023-07-21 ENCOUNTER — Encounter: Payer: Self-pay | Admitting: Gastroenterology

## 2023-07-21 ENCOUNTER — Ambulatory Visit: Admitting: Gastroenterology

## 2023-07-21 VITALS — BP 122/68 | HR 71 | Ht 66.0 in | Wt 204.0 lb

## 2023-07-21 DIAGNOSIS — K449 Diaphragmatic hernia without obstruction or gangrene: Secondary | ICD-10-CM | POA: Diagnosis not present

## 2023-07-21 DIAGNOSIS — K219 Gastro-esophageal reflux disease without esophagitis: Secondary | ICD-10-CM

## 2023-07-21 NOTE — Progress Notes (Signed)
 Discussed the use of AI scribe software for clinical note transcription with the patient, who gave verbal consent to proceed.  HPI : Stacy Boyd is a 68 year old female with a known hiatal hernia who presents with worsening symptoms she feels are related to her hernia.  She experiences a pressure sensation in her chest, which worsens with deep breaths and with eating.  These episodes vary in intensity, with the most severe attack occurring several months ago, characterized by chest tightness that resolved after burping. Symptoms are always related to food intake, especially if she eats too fast or too much.  Occasionally, she has heartburn, but it is less frequent than the pressure sensation. She has been on pantoprazole  for nearly two months, initially at 40 mg for 60 days, then reduced to 20 mg for 30 days with plans to transition to every other day. While the medication has improved her symptoms, she still experiences attacks once or twice a week.  She experiences occasional dysphagia, with food feeling like it 'just won't go down,' particularly after eating.   She has a history of a large hiatal hernia, with an upper endoscopy performed in March 2019 and a CT scan in January 2024. She also had H. pylori gastritis and Cameron's lesions identified during the endoscopy. A stool test confirmed the eradication of H. pylori.  She has adjusted her lifestyle by avoiding wearing bras and tight waistbands, which seem to exacerbate her symptoms.   She denies any nausea, vomiting, nocturnal pain, or reflux symptoms that wake her from sleep. Her bowel movements are regular and well-formed.   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   Her brother recently underwent surgery for a similar condition.      Colonoscopy Sept 2023 (Dr. Stacia) Mild  diverticulosis in the sigmoid and descending colon Otherwise normal Recommend consider repeat colonoscopy in 10 years   EGD March 2019 (Dr.  Teressa) Gastritis, H. Pylori positive Medium sized hiatal hernia with Cameron's lesions   CT Chest Feb 10, 2022 IMPRESSION: 1. Unchanged bilateral pulmonary nodules measuring up to 8 mm. Given stability of nodules at 3 months, a future CT at 18-24 months (from 11/07/2021) is considered optional for low-risk patients, but is recommended for high-risk patients. This recommendation follows the consensus statement: Guidelines for Management of Incidental Pulmonary Nodules Detected on CT Images: From the Fleischner Society 2017; Radiology 2017; [id number]. 2. Large hiatal hernia. 3. Aortic Atherosclerosis (ICD10-I70.0)  Past Medical History:  Diagnosis Date   Allergy 2020   seasonal   Anemia    Arthritis    Cataract 2020   Clotting disorder (HCC)    see chart   Depression    Currently not being treated   Dry eye    GERD (gastroesophageal reflux disease)    H. pylori infection    History of hiatal hernia    Hypercholesteremia 10/08/2022   Hypothyroid    Pneumonia    Pre-diabetes    Ulcer    see chart     Past Surgical History:  Procedure Laterality Date   ABDOMINAL HYSTERECTOMY     CESAREAN SECTION     COLONOSCOPY  2016   at [facility]   EYE SURGERY  1999   Lasix   JOINT REPLACEMENT  Nov 2021 & Feb 2022   Right knee Nov, Left knee Feb  REFRACTIVE SURGERY     TOTAL KNEE ARTHROPLASTY Right 11/20/2019   Procedure: TOTAL KNEE ARTHROPLASTY;  Surgeon: Melodi Lerner, MD;  Location: [facility];  Service: Orthopedics;  Laterality: Right;    TOTAL KNEE ARTHROPLASTY Left 03/04/2020   Procedure: TOTAL KNEE ARTHROPLASTY;  Surgeon: Melodi Lerner, MD;  Location: [facility];  Service: Orthopedics;  Laterality: Left;    Family History  Problem Relation Age of Onset   Colon cancer Father 27   Cancer Father    Heart disease Father     Breast cancer Sister    Diabetes Sister    Hyperlipidemia Sister    Prostate cancer Brother    Diabetes Brother    Hearing loss Brother    Hyperlipidemia Brother    Diabetes Mother    Hearing loss Mother    Hypertension Mother    Stroke Mother    Cancer Sister    Early death Sister    Stomach cancer Neg Hx    Rectal cancer Neg Hx    Pancreatic cancer Neg Hx    Esophageal cancer Neg Hx    Social History   Tobacco Use   Smoking status: Never   Smokeless tobacco: Never  Vaping Use   Vaping status: Never Used  Substance Use Topics   Alcohol use: Not Currently    Alcohol/week: 2.0 standard drinks of alcohol    Types: 2 Standard drinks or equivalent per week    Comment: on occasion   Drug use: Never   Current Outpatient Medications  Medication Sig Dispense Refill   Calcium-Phosphorus-Vitamin D  (CITRACAL +D3 PO)      Cholecalciferol (VITAMIN D -3) 125 MCG (5000 UT) TABS Take 5,000 Units by mouth daily.     Coenzyme Q10 (COQ10) 100 MG CAPS      famotidine -calcium carbonate-magnesium hydroxide (PEPCID  COMPLETE) 10-800-165 MG chewable tablet Chew 1 tablet by mouth at bedtime. Chewable     levothyroxine (SYNTHROID) 75 MCG tablet 1 tablet in the morning on an empty stomach Orally Once a day for 90 days     liothyronine (CYTOMEL) 5 MCG tablet 2 tablets Orally Once a day for 90 days     Multiple Vitamins-Minerals (CENTRUM SILVER 50+WOMEN PO) Take 1 tablet daily by mouth.     Omega-3 Fatty Acids (FISH OIL ULTRA) 1400 MG CAPS Take 1,400 mg by mouth daily.     pantoprazole  (PROTONIX ) 20 MG tablet Take 2 tablets (40 mg total) by mouth daily for 60 days, THEN 1 tablet (20 mg total) daily for 30 days, THEN 1 tablet (20 mg total) every other day. 165 tablet 0   Polyethyl Glycol-Propyl Glycol 0.4-0.3 % SOLN Place 1 drop into both eyes 4 (four) times daily as needed (Dry eyes).     progesterone  (PROMETRIUM ) 100 MG capsule 100 mg.     TURMERIC PO Take 2,000 mg by mouth daily.     No current  facility-administered medications for this visit.   Allergies  Allergen Reactions   Erythromycin Hives, Nausea Only and Rash     Review of Systems: All systems reviewed and negative except where noted in HPI.    No results found.  Physical Exam: BP 122/68   Pulse 71   Ht 5' 6 (1.676 m)   Wt 204 lb (92.5 kg)   BMI 32.93 kg/m  Constitutional: Pleasant,well-developed, Caucasian female in no acute distress. HEENT: Normocephalic and atraumatic. Conjunctivae are normal. No scleral icterus. Neck supple.  Cardiovascular: Normal rate, regular rhythm.  Pulmonary/chest: Effort normal  and breath sounds normal. No wheezing, rales or rhonchi. Abdominal: Soft, nondistended, nontender. Bowel sounds active throughout. There are no masses palpable. No hepatomegaly. Extremities: no edema Lymphadenopathy: No cervical adenopathy noted. Neurological: Alert and oriented to person place and time. Skin: Skin is warm and dry. No rashes noted. Psychiatric: Normal mood and affect. Behavior is normal.  CBC    Component Value Date/Time   WBC 5.2 06/10/2023 0859   WBC 10.4 03/05/2020 0315   RBC 4.82 06/10/2023 0859   RBC 3.70 (L) 03/05/2020 0315   HGB 15.6 06/10/2023 0859   HCT 47.1 (H) 06/10/2023 0859   PLT 333 06/10/2023 0859   MCV 98 (H) 06/10/2023 0859   MCH 32.4 06/10/2023 0859   MCH 32.4 03/05/2020 0315   MCHC 33.1 06/10/2023 0859   MCHC 33.8 03/05/2020 0315   RDW 13.2 06/10/2023 0859   LYMPHSABS 1.5 10/01/2022 0947   MONOABS 0.7 04/28/2018 1433   EOSABS 0.1 10/01/2022 0947   BASOSABS 0.0 10/01/2022 0947    CMP     Component Value Date/Time   NA 135 10/01/2022 0947   K 4.6 10/01/2022 0947   CL 98 10/01/2022 0947   CO2 21 10/01/2022 0947   GLUCOSE 99 10/01/2022 0947   GLUCOSE 127 (H) 03/05/2020 0315   BUN 23 10/01/2022 0947   CREATININE 1.00 10/01/2022 0947   CALCIUM 9.4 10/01/2022 0947   PROT 6.8 10/01/2022 0947   ALBUMIN 4.6 10/01/2022 0947   AST 20 10/01/2022 0947    ALT 18 10/01/2022 0947   ALKPHOS 64 10/01/2022 0947   BILITOT 0.4 10/01/2022 0947   GFRNONAA >60 03/05/2020 0315   GFRAA 86 10/11/2019 1401       Latest Ref Rng & Units 06/10/2023    8:59 AM 10/01/2022    9:47 AM 10/15/2020   10:29 AM  CBC EXTENDED  WBC 3.4 - 10.8 x10E3/uL 5.2  5.1  4.5   RBC 3.77 - 5.28 x10E6/uL 4.82  4.54  4.90   Hemoglobin 11.1 - 15.9 g/dL 84.3  85.2  84.8   HCT 34.0 - 46.6 % 47.1  43.0  45.7   Platelets 150 - 450 x10E3/uL 333  335  361   NEUT# 1.4 - 7.0 x10E3/uL  2.9  2.6   Lymph# 0.7 - 3.1 x10E3/uL  1.5  1.2       ASSESSMENT AND PLAN:  68 year old female with known medium to large size hiatal hernia, with worsening symptoms of postprandial discomfort and pressure sensation.  It is likely that the symptoms are related to her hernia.  Her typical GERD symptoms seem to be fairly well-controlled with PPI.  Recommend patient meet with surgeon to determine if hiatal hernia repair suitable.  Will perform upper endoscopy to endoscopically reassess hernia and see if there are persistent Cameron's ulcers and gastritis   Hiatal hernia with gastroesophageal reflux Intermittent postprandial chest pressure, likely due to large hiatal hernia. Symptoms somewhat improved with pantoprazole . Hernia noted on EGD in 2019 and CT Jan 2024 . Surgical repair considered due to hernia size and symptoms.  - Place referral to general surgery for hiatal hernia repair. - Order upper endoscopy to assess hernia size and check for esophageal narrowing, inflammation, or ulcers.  Cameron's lesions due to hiatal hernia Cameron's lesions identified in March 2019, likely due to large hiatal hernia. - Order upper endoscopy to reassess for Cameron's lesions and evaluate for new ulcers or inflammation.  Recording duration: 11 minutes     Jacarri Gesner E. Stacia, MD  [facility]    Chandra Toribio POUR, MD

## 2023-07-21 NOTE — Patient Instructions (Signed)
 We have sent a referral [facility] and they will contact you to make an appointment .  You have been scheduled for an endoscopy. Please follow written instructions given to you at your visit today.  If you use inhalers (even only as needed), please bring them with you on the day of your procedure.  If you take any of the following medications, they will need to be adjusted prior to your procedure:   DO NOT TAKE 7 DAYS PRIOR TO TEST- Trulicity (dulaglutide) Ozempic, Wegovy (semaglutide) Mounjaro (tirzepatide) Bydureon Bcise (exanatide extended release)  DO NOT TAKE 1 DAY PRIOR TO YOUR TEST Rybelsus (semaglutide) Adlyxin (lixisenatide) Victoza (liraglutide) Byetta (exanatide) ___________________________________________________________________________   _______________________________________________________  If your blood pressure at your visit was 140/90 or greater, please contact your primary care physician to follow up on this.  _______________________________________________________  If you are age 65 or older, your body mass index should be between 23-30. Your Body mass index is 32.93 kg/m. If this is out of the aforementioned range listed, please consider follow up with your Primary Care Provider.  If you are age 67 or younger, your body mass index should be between 19-25. Your Body mass index is 32.93 kg/m. If this is out of the aformentioned range listed, please consider follow up with your Primary Care Provider.   ________________________________________________________  The [facility] providers would like to encourage you to use MYCHART to communicate with providers for non-urgent requests or questions.  Due to long hold times on the telephone, sending your provider a message by [facility] may be a faster and more efficient way to get a response.  Please allow 48 business hours for a response.  Please remember that this is for non-urgent requests.   _______________________________________________________   It was a pleasure to see you today!  Thank you for trusting me with your gastrointestinal care!    Scott E.Stacia, MD

## 2023-07-31 IMAGING — CR DG SHOULDER 2+V*R*
3 series · 3 of 3 positions shown · non-contrast
Comparison: Right shoulder x-ray 08/29/2020.

CLINICAL DATA: Right shoulder pain.

EXAM:
RIGHT SHOULDER - 2+ VIEW

[w shoulder grashey right]
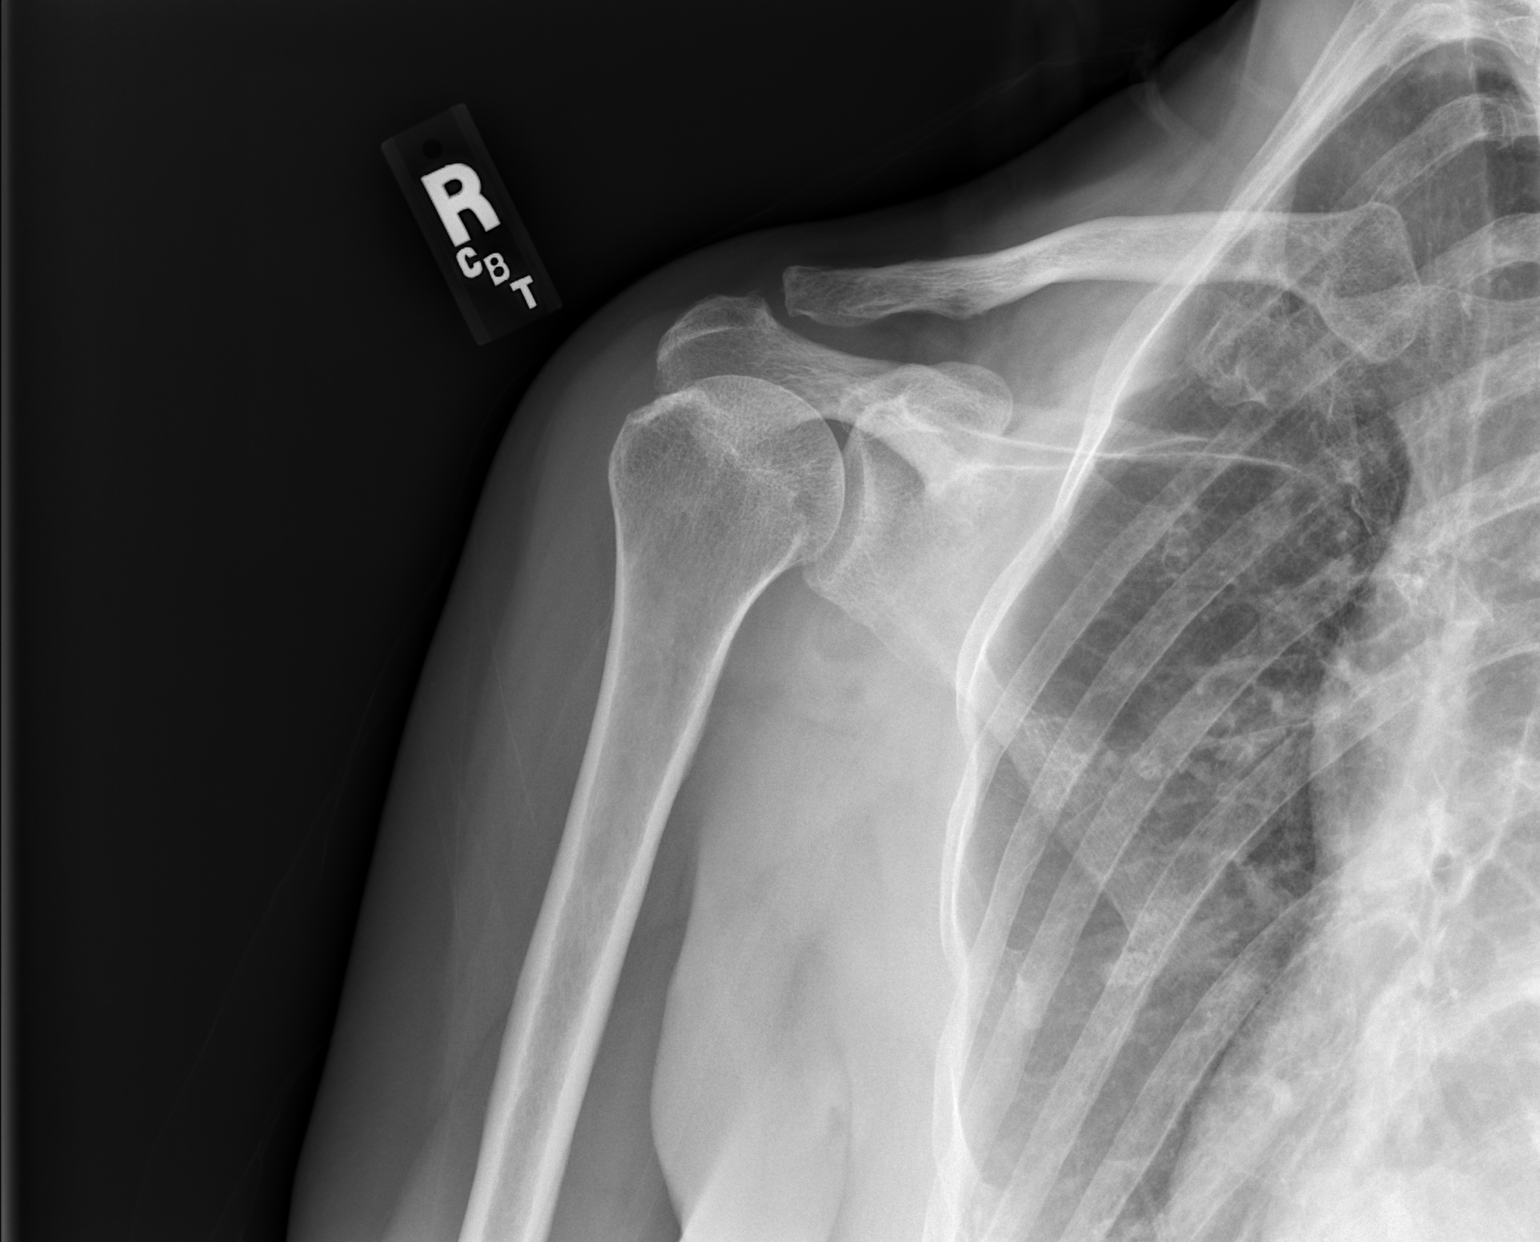

[w shoulder y-view right]
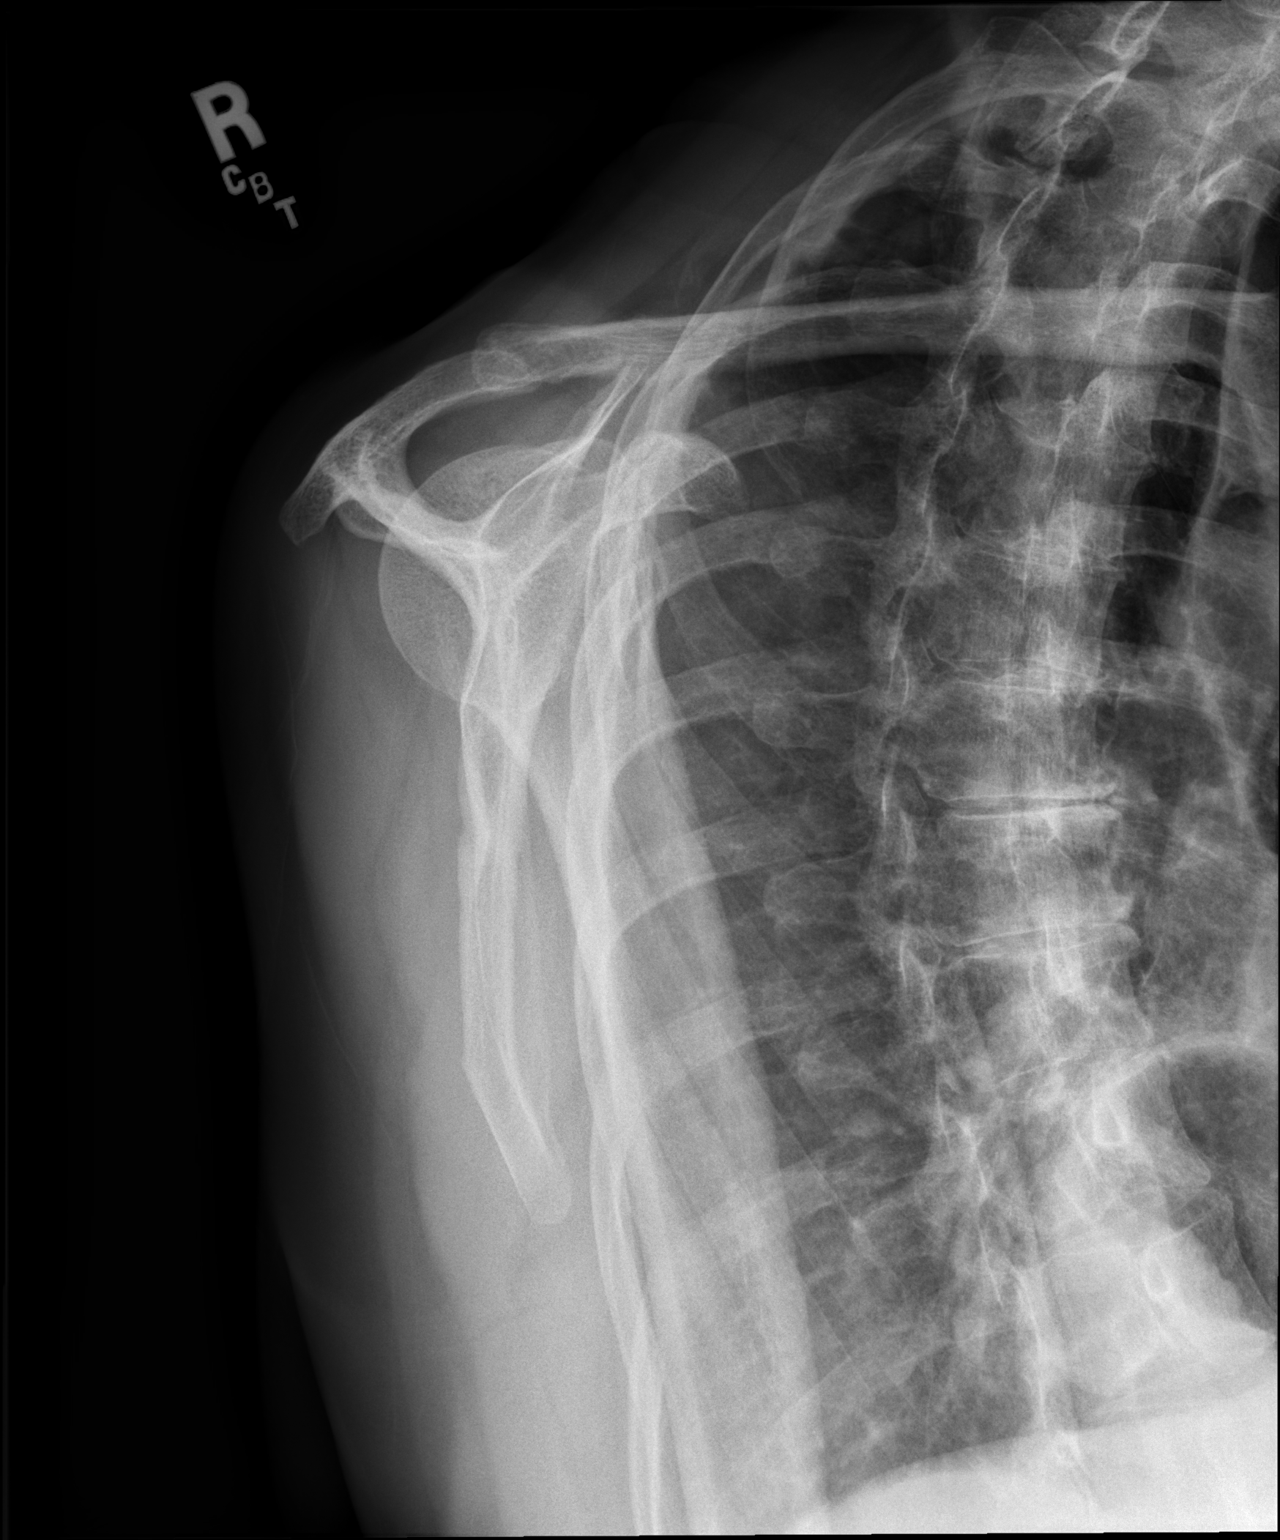

[x shoulder axillary right]
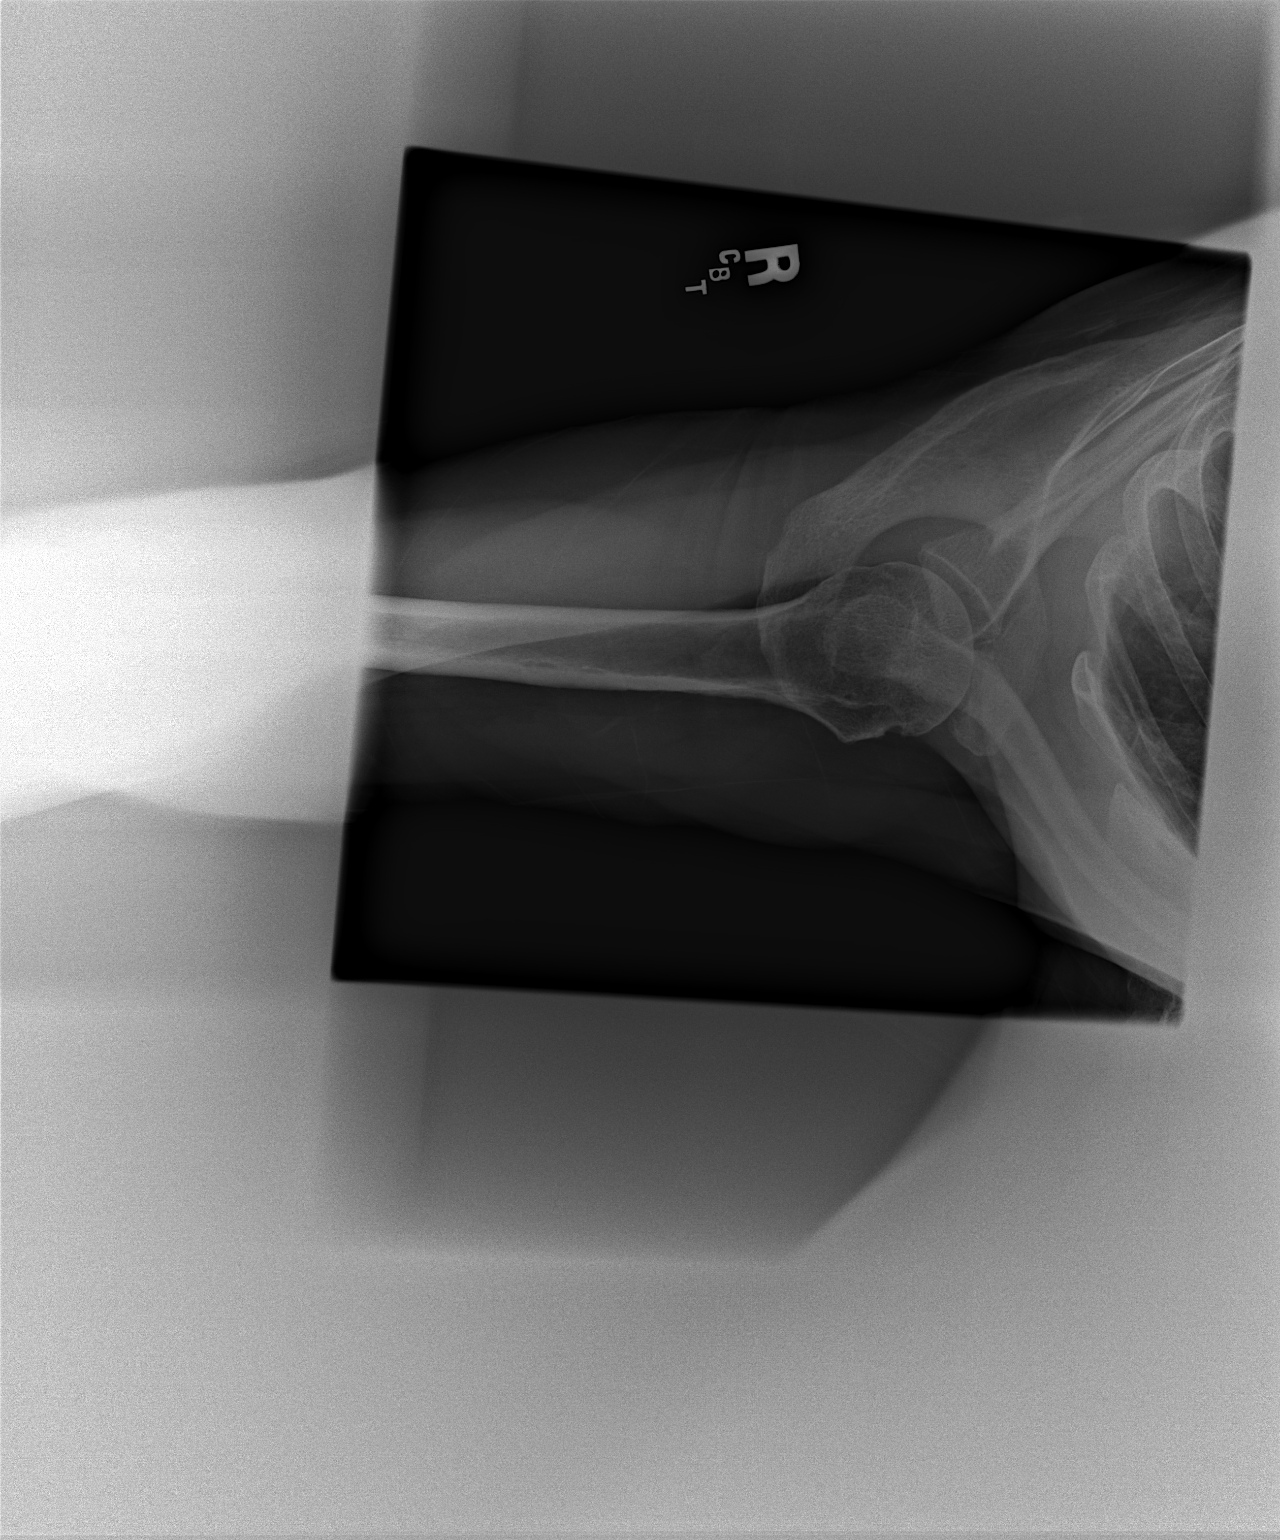

[3 of 3 positions shown; findings below may reference images not displayed]

FINDINGS: There is no evidence of fracture or dislocation. Again seen is
minimal cortical irregularity in the region of the greater
tuberosity, unchanged. Joint spaces are well maintained. Soft
tissues are unremarkable.
IMPRESSION: 1. No acute fracture or dislocation.
2. Minimal cortical irregularity of the greater tuberosities
unchanged, likely degenerative.

## 2023-08-06 ENCOUNTER — Ambulatory Visit (INDEPENDENT_AMBULATORY_CARE_PROVIDER_SITE_OTHER): Admitting: Family Medicine

## 2023-08-06 VITALS — BP 133/75 | HR 62 | Ht 66.0 in | Wt 202.4 lb

## 2023-08-06 DIAGNOSIS — K449 Diaphragmatic hernia without obstruction or gangrene: Secondary | ICD-10-CM | POA: Diagnosis not present

## 2023-08-06 NOTE — Patient Instructions (Addendum)
 It was nice to see you today,  We addressed the following topics today: -Continue with your pantoprazole  taper unless otherwise directed by your gastroenterologist. - Your labs were not concerning - Schedule your physical with me sometime between September and December.  At that time I can evaluate any concerning skin lesions you may notice.  Have a great day,  Rolan Slain, MD

## 2023-08-06 NOTE — Progress Notes (Signed)
   Established Patient Office Visit  Subjective   Patient ID: Stacy Boyd, female    DOB: July 14, 1955  Age: 68 y.o. MRN: [id number]  Chief Complaint  Patient presents with   Medical Management of Chronic Issues    HPI  Subjective - Follow-up on GERD symptoms secondary to hiatal hernia. Reports feeling better with pantoprazole . Able to anticipate symptom onset with eating. Had one episode of symptoms on a plane after overeating. - Reports recent international travel to [place] which was stressful due to long lines at customs. - Discussed long-term effects of PPIs, specifically bone density. Reports a history of bone density issues but the last scan was almost normal. Discontinued yearly infusions for this. Due for another bone scan and has a rheumatology appointment in November for Sjgren's syndrome. - Reports being on the cusp of pre-diabetes but is asymptomatic. - Denies any new concerns.  Medications Pantoprazole  40 mg daily, currently tapering to 20 mg daily for one month, then 20 mg every other day for 30 days. Pepcid  at night. Takes hormone replacement therapy. No medications for Sjgren's syndrome for a long time.  PMH, PSH, FH, Social Hx PMHx: GERD with hiatal hernia, fatigue (associated with GERD attacks), history of low bone density, Sjgren's syndrome, pre-diabetes. History of H. pylori (resolved). PSH: Knee surgery. Social Hx: Licensed conveyancer. Lives in a house with a leaking roof, has arranged for a roofer. Spends time outside but covers up.  ROS Constitutional: Reports fatigue only during GERD attacks, which has improved. GI: Reports improved reflux. Denies other GI symptoms. Skin: Denies any concerning skin lesions or changes in freckles.   The 10-year ASCVD risk score (Arnett DK, et al., 2019) is: 10.1%  Health Maintenance Due  Topic Date Due   COVID-19 Vaccine (11 - 2024-25 season) 03/12/2023      Objective:     BP 133/75   Pulse 62   Ht  5' 6 (1.676 m)   Wt 202 lb 6.4 oz (91.8 kg)   SpO2 98%   BMI 32.67 kg/m    Physical Exam Gen: alert, oriented Pulm: no resp distress Psych: pleasant affect   No results found for any visits on 08/06/23.      Assessment & Plan:   Hiatal hernia Assessment & Plan: Presents for follow-up of GERD secondary to a known hiatal hernia. Symptoms have improved on pantoprazole  40mg  daily with nighttime Pepcid . Reports being able to better predict attacks related to food intake. Fatigue, which was associated with GERD attacks, has also improved. Labs at the last visit were normal, including a negative H. pylori test. Will follow up with a gastroenterologist. - Continue pantoprazole  taper: 20 mg daily for the next month, then 20 mg every other day for 30 days. - Continue Pepcid  at night as needed. - Follow up with Gastroenterology as scheduled: endoscopy on 08/26/2023 and a surgical consult at the end of August. - Discussed long-term risks of PPIs, such as decreased bone density, and the benefits of tapering to the lowest effective dose.      Return in about 3 months (around 11/06/2023) for physical.    Stacy MARLA Slain, MD

## 2023-08-06 NOTE — Assessment & Plan Note (Signed)
 Presents for follow-up of GERD secondary to a known hiatal hernia. Symptoms have improved on pantoprazole  40mg  daily with nighttime Pepcid . Reports being able to better predict attacks related to food intake. Fatigue, which was associated with GERD attacks, has also improved. Labs at the last visit were normal, including a negative H. pylori test. Will follow up with a gastroenterologist. - Continue pantoprazole  taper: 20 mg daily for the next month, then 20 mg every other day for 30 days. - Continue Pepcid  at night as needed. - Follow up with Gastroenterology as scheduled: endoscopy on 08/26/2023 and a surgical consult at the end of August. - Discussed long-term risks of PPIs, such as decreased bone density, and the benefits of tapering to the lowest effective dose.

## 2023-08-16 ENCOUNTER — Telehealth: Payer: Self-pay

## 2023-08-16 NOTE — Telephone Encounter (Signed)
 Copied from CRM [id number]. Topic: Appointments - Scheduling Inquiry for Clinic >> Aug 16, 2023  9:21 AM Mesmerise C wrote: Reason for CRM: Patient has 2 labs scheduled 2 months apart on 9/19 and 11/26 inquiring if both need to be scheduled along with the office visits or is one needs to be cancelled

## 2023-08-16 NOTE — Telephone Encounter (Signed)
 Contacted pt and informed her that I would be cancelling the Sept appt for labs.

## 2023-08-18 ENCOUNTER — Encounter: Payer: Self-pay | Admitting: Gastroenterology

## 2023-08-26 ENCOUNTER — Encounter: Payer: Self-pay | Admitting: Gastroenterology

## 2023-08-26 ENCOUNTER — Ambulatory Visit (AMBULATORY_SURGERY_CENTER): Admitting: Gastroenterology

## 2023-08-26 VITALS — BP 122/54 | HR 62 | Temp 98.1°F | Resp 20 | Ht 66.0 in | Wt 204.0 lb

## 2023-08-26 DIAGNOSIS — E039 Hypothyroidism, unspecified: Secondary | ICD-10-CM | POA: Diagnosis not present

## 2023-08-26 DIAGNOSIS — K3189 Other diseases of stomach and duodenum: Secondary | ICD-10-CM | POA: Diagnosis not present

## 2023-08-26 DIAGNOSIS — K219 Gastro-esophageal reflux disease without esophagitis: Secondary | ICD-10-CM

## 2023-08-26 DIAGNOSIS — F32A Depression, unspecified: Secondary | ICD-10-CM | POA: Diagnosis not present

## 2023-08-26 DIAGNOSIS — K449 Diaphragmatic hernia without obstruction or gangrene: Secondary | ICD-10-CM

## 2023-08-26 DIAGNOSIS — K2100 Gastro-esophageal reflux disease with esophagitis, without bleeding: Secondary | ICD-10-CM

## 2023-08-26 DIAGNOSIS — K319 Disease of stomach and duodenum, unspecified: Secondary | ICD-10-CM | POA: Diagnosis not present

## 2023-08-26 DIAGNOSIS — Q399 Congenital malformation of esophagus, unspecified: Secondary | ICD-10-CM

## 2023-08-26 DIAGNOSIS — R7303 Prediabetes: Secondary | ICD-10-CM | POA: Diagnosis not present

## 2023-08-26 MED ORDER — SODIUM CHLORIDE 0.9 % IV SOLN: 500.0000 mL | INTRAVENOUS | Status: DC

## 2023-08-26 NOTE — Progress Notes (Signed)
 [facility] History and Physical   Primary Care Physician:  Chandra Toribio POUR, MD   Reason for Procedure:   GERD symptoms, reassess hiatal hernia  Plan:    EGD     HPI: Stacy Boyd is a 68 y.o. female undergoing repeat EGD to reassess known hiatal with associated chest discomfort and occasional dysphagia.  She has weaned down her pantoprazole  to 20 mg PO daily with no exacerbation in her symptoms.     Past Medical History:  Diagnosis Date   Allergy 2020   seasonal   Anemia    Arthritis    Cataract 2020   Clotting disorder (HCC)    see chart   Depression    Currently not being treated   Dry eye    GERD (gastroesophageal reflux disease)    H. pylori infection    History of hiatal hernia    Hypercholesteremia 10/08/2022   Hypothyroid    Pneumonia    Pre-diabetes    Ulcer    see chart    Past Surgical History:  Procedure Laterality Date   ABDOMINAL HYSTERECTOMY     CESAREAN SECTION     COLONOSCOPY  2016   at [facility]   EYE SURGERY  1999   Lasix   JOINT REPLACEMENT  Nov 2021 & Feb 2022   Right knee Nov, Left knee Feb   REFRACTIVE SURGERY     TOTAL KNEE ARTHROPLASTY Right 11/20/2019   Procedure: TOTAL KNEE ARTHROPLASTY;  Surgeon: Melodi Lerner, MD;  Location: [facility];  Service: Orthopedics;  Laterality: Right;    TOTAL KNEE ARTHROPLASTY Left 03/04/2020   Procedure: TOTAL KNEE ARTHROPLASTY;  Surgeon: Melodi Lerner, MD;  Location: [facility];  Service: Orthopedics;  Laterality: Left;     Prior to Admission medications   Medication Sig Start Date End Date Taking? Authorizing Provider  Calcium-Phosphorus-Vitamin D  (CITRACAL +D3 PO)  03/21/20  Yes [provider]  Cholecalciferol (VITAMIN D -3) 125 MCG (5000 UT) TABS Take 5,000 Units by mouth daily.   Yes [provider]  Coenzyme Q10 (COQ10) 100 MG CAPS  02/12/21  Yes [provider]  Cyanocobalamin  (B-12) 5000 MCG SUBL  06/13/22  Yes [provider]   cycloSPORINE (RESTASIS) 0.05 % ophthalmic emulsion Place 1 drop into both eyes 2 (two) times daily.   Yes [provider]  famotidine -calcium carbonate-magnesium hydroxide (PEPCID  COMPLETE) 10-800-165 MG chewable tablet Chew 1 tablet by mouth at bedtime. Chewable   Yes [provider]  fexofenadine (ALLEGRA) 180 MG tablet 1 tablet every day by oral route. 06/13/22  Yes [provider]  levothyroxine (SYNTHROID) 75 MCG tablet 1 tablet in the morning on an empty stomach Orally Once a day for 90 days 09/12/21  Yes [provider]  Multiple Vitamins-Minerals (CENTRUM SILVER 50+WOMEN PO) Take 1 tablet daily by mouth.   Yes [provider]  Omega-3 Fatty Acids (FISH OIL ULTRA) 1400 MG CAPS Take 1,400 mg by mouth daily.   Yes [provider]  Polyethyl Glycol-Propyl Glycol 0.4-0.3 % SOLN Place 1 drop into both eyes 4 (four) times daily as needed (Dry eyes).   Yes [provider]  progesterone  (PROMETRIUM ) 100 MG capsule 100 mg. 06/26/21  Yes [provider]  TURMERIC PO Take 2,000 mg by mouth daily.   Yes [provider]  pantoprazole  (PROTONIX ) 20 MG tablet Take 2 tablets (40 mg total) by mouth daily for 60 days, THEN 1 tablet (20 mg total) daily for 30 days, THEN  1 tablet (20 mg total) every other day. Patient not taking: Reported on 08/26/2023 06/10/23 10/08/23  Chandra Toribio POUR, MD    Current Outpatient Medications  Medication Sig Dispense Refill   Calcium-Phosphorus-Vitamin D  (CITRACAL +D3 PO)      Cholecalciferol (VITAMIN D -3) 125 MCG (5000 UT) TABS Take 5,000 Units by mouth daily.     Coenzyme Q10 (COQ10) 100 MG CAPS      Cyanocobalamin  (B-12) 5000 MCG SUBL      cycloSPORINE (RESTASIS) 0.05 % ophthalmic emulsion Place 1 drop into both eyes 2 (two) times daily.     famotidine -calcium carbonate-magnesium hydroxide (PEPCID  COMPLETE) 10-800-165 MG chewable tablet Chew 1 tablet by mouth at bedtime. Chewable     fexofenadine  (ALLEGRA) 180 MG tablet 1 tablet every day by oral route.     levothyroxine (SYNTHROID) 75 MCG tablet 1 tablet in the morning on an empty stomach Orally Once a day for 90 days     Multiple Vitamins-Minerals (CENTRUM SILVER 50+WOMEN PO) Take 1 tablet daily by mouth.     Omega-3 Fatty Acids (FISH OIL ULTRA) 1400 MG CAPS Take 1,400 mg by mouth daily.     Polyethyl Glycol-Propyl Glycol 0.4-0.3 % SOLN Place 1 drop into both eyes 4 (four) times daily as needed (Dry eyes).     progesterone  (PROMETRIUM ) 100 MG capsule 100 mg.     TURMERIC PO Take 2,000 mg by mouth daily.     pantoprazole  (PROTONIX ) 20 MG tablet Take 2 tablets (40 mg total) by mouth daily for 60 days, THEN 1 tablet (20 mg total) daily for 30 days, THEN 1 tablet (20 mg total) every other day. (Patient not taking: Reported on 08/26/2023) 165 tablet 0   Current Facility-Administered Medications  Medication Dose Route Frequency Provider Last Rate Last Admin   0.9 %  sodium chloride  infusion  500 mL Intravenous Continuous Stacia Glendia BRAVO, MD        Allergies as of 08/26/2023 - Review Complete 08/26/2023  Allergen Reaction Noted   Erythromycin Hives, Nausea Only, and Rash 05/17/2010    Family History  Problem Relation Age of Onset   Colon cancer Father 72   Cancer Father    Heart disease Father    Breast cancer Sister    Diabetes Sister    Hyperlipidemia Sister    Prostate cancer Brother    Diabetes Brother    Hearing loss Brother    Hyperlipidemia Brother    Diabetes Mother    Hearing loss Mother    Hypertension Mother    Stroke Mother    Cancer Sister    Early death Sister    Stomach cancer Neg Hx    Rectal cancer Neg Hx    Pancreatic cancer Neg Hx    Esophageal cancer Neg Hx     Social History   Socioeconomic History   Marital status: Single    Spouse name: Not on file   Number of children: Not on file   Years of education: Not on file   Highest education level: Bachelor's degree (e.g., BA, AB, BS)   Occupational History   Occupation: Dance movement psychotherapist. Horticulturist, commercial: Production assistant, radio FOR SELF EMPLOYED  Tobacco Use   Smoking status: Never   Smokeless tobacco: Never  Vaping Use   Vaping status: Never Used  Substance and Sexual Activity   Alcohol use: Not Currently    Alcohol/week: 2.0 standard drinks of alcohol    Types: 2 Standard drinks or equivalent per week  Comment: on occasion   Drug use: Never   Sexual activity: Not Currently    Birth control/protection: Post-menopausal, None  Other Topics Concern   Not on file  Social History Narrative   Not on file   Social Drivers of Health   Financial Resource Strain: Low Risk  (08/06/2023)   Overall Financial Resource Strain (CARDIA)    Difficulty of Paying Living Expenses: Not hard at all  Food Insecurity: No Food Insecurity (08/06/2023)   Hunger Vital Sign    Worried About Running Out of Food in the Last Year: Never true    Ran Out of Food in the Last Year: Never true  Transportation Needs: No Transportation Needs (08/06/2023)   PRAPARE - Administrator, Civil Service (Medical): No    Lack of Transportation (Non-Medical): No  Physical Activity: Sufficiently Active (08/06/2023)   Exercise Vital Sign    Days of Exercise per Week: 5 days    Minutes of Exercise per Session: 150+ min  Stress: No Stress Concern Present (08/06/2023)   [employer] of Occupational Health - Occupational Stress Questionnaire    Feeling of Stress: Only a little  Social Connections: Moderately Integrated (08/06/2023)   Social Connection and Isolation Panel    Frequency of Communication with Friends and Family: Once a week    Frequency of Social Gatherings with Friends and Family: More than three times a week    Attends Religious Services: More than 4 times per year    Active Member of [employer] or Organizations: Yes    Attends Engineer, structural: More than 4 times per year    Marital Status: Divorced  Intimate Partner  Violence: Not At Risk (02/18/2023)   Humiliation, Afraid, Rape, and Kick questionnaire    Fear of Current or Ex-Partner: No    Emotionally Abused: No    Physically Abused: No    Sexually Abused: No    Review of Systems:  All other review of systems negative except as mentioned in the HPI.  Physical Exam: Vital signs BP (!) 154/86   Pulse 65   Temp 98.1 F (36.7 C)   Ht 5' 6 (1.676 m)   Wt 204 lb (92.5 kg)   SpO2 97%   BMI 32.93 kg/m   General:   Alert,  Well-developed, well-nourished, pleasant and cooperative in NAD Airway:  Mallampati 2 Lungs:  Clear throughout to auscultation.   Heart:  Regular rate and rhythm; no murmurs, clicks, rubs,  or gallops. Abdomen:  Soft, nontender and nondistended. Normal bowel sounds.   Neuro/Psych:  Normal mood and affect. A and O x 3   Irene Collings E. Stacia, MD [facility]

## 2023-08-26 NOTE — Patient Instructions (Signed)
 Educational handout provided to patient related to Hemorrhoids, Polyps, and Diverticulosis  Resume previous diet  Continue present medications  Awaiting pathology results  Follow up with Dr. Sheldon to discuss hernia reduction  YOU HAD AN ENDOSCOPIC PROCEDURE TODAY AT THE [facility]:   Refer to the procedure report that was given to you for any specific questions about what was found during the examination.  If the procedure report does not answer your questions, please call your gastroenterologist to clarify.  If you requested that your care partner not be given the details of your procedure findings, then the procedure report has been included in a sealed envelope for you to review at your convenience later.  YOU SHOULD EXPECT: Some feelings of bloating in the abdomen. Passage of more gas than usual.  Walking can help get rid of the air that was put into your GI tract during the procedure and reduce the bloating. If you had a lower endoscopy (such as a colonoscopy or flexible sigmoidoscopy) you may notice spotting of blood in your stool or on the toilet paper. If you underwent a bowel prep for your procedure, you may not have a normal bowel movement for a few days.  Please Note:  You might notice some irritation and congestion in your nose or some drainage.  This is from the oxygen used during your procedure.  There is no need for concern and it should clear up in a day or so.  SYMPTOMS TO REPORT IMMEDIATELY:  Following upper endoscopy (EGD)  Vomiting of blood or coffee ground material  New chest pain or pain under the shoulder blades  Painful or persistently difficult swallowing  New shortness of breath  Fever of 100F or higher  Black, tarry-looking stools  For urgent or emergent issues, a gastroenterologist can be reached at any hour by calling [phone]. Do not use MyChart messaging for urgent concerns.    DIET:  We do recommend a small meal at first, but then you  may proceed to your regular diet.  Drink plenty of fluids but you should avoid alcoholic beverages for 24 hours.  ACTIVITY:  You should plan to take it easy for the rest of today and you should NOT DRIVE or use heavy machinery until tomorrow (because of the sedation medicines used during the test).    FOLLOW UP: Our staff will call the number listed on your records the next business day following your procedure.  We will call around 7:15- 8:00 am to check on you and address any questions or concerns that you may have regarding the information given to you following your procedure. If we do not reach you, we will leave a message.     If any biopsies were taken you will be contacted by phone or by letter within the next 1-3 weeks.  Please call us  at [phone] if you have not heard about the biopsies in 3 weeks.    SIGNATURES/CONFIDENTIALITY: You and/or your care partner have signed paperwork which will be entered into your electronic medical record.  These signatures attest to the fact that that the information above on your After Visit Summary has been reviewed and is understood.  Full responsibility of the confidentiality of this discharge information lies with you and/or your care-partner.

## 2023-08-26 NOTE — Progress Notes (Signed)
 Called to room to assist during endoscopic procedure.  Patient ID and intended procedure confirmed with present staff. Received instructions for my participation in the procedure from the performing physician.

## 2023-08-26 NOTE — Op Note (Signed)
 [facility] Patient Name: Stacy Boyd Procedure Date: 08/26/2023 9:46 AM MRN: [id number] Endoscopist: Glendia E. Boyd , MD, [id number] Age: 68 Referring MD:  Date of Birth: May 16, 1955 Gender: Female Account #: [id number] Procedure:                Upper GI endoscopy Indications:              Follow-up of hiatal hernia, Chest pain (non cardiac) Medicines:                Monitored Anesthesia Care Procedure:                Pre-Anesthesia Assessment:                           - Prior to the procedure, a History and Physical                            was performed, and patient medications and                            allergies were reviewed. The patient's tolerance of                            previous anesthesia was also reviewed. The risks                            and benefits of the procedure and the sedation                            options and risks were discussed with the patient.                            All questions were answered, and informed consent                            was obtained. Prior Anticoagulants: The patient has                            taken no anticoagulant or antiplatelet agents. ASA                            Grade Assessment: II - A patient with mild systemic                            disease. After reviewing the risks and benefits,                            the patient was deemed in satisfactory condition to                            undergo the procedure.                           After obtaining informed consent, the endoscope was  passed under direct vision. Throughout the                            procedure, the patient's blood pressure, pulse, and                            oxygen saturations were monitored continuously. The                            GIF [id number] #[id number] was introduced through the                            mouth, and advanced to the second part of duodenum.                             The upper GI endoscopy was accomplished without                            difficulty. The patient tolerated the procedure                            well. Scope In: Scope Out: Findings:                 The lower third of the esophagus was moderately                            tortuous.                           Localized mucosal changes characterized by                            brownish/yellow discoloration were found at the                            gastroesophageal junction. Biopsies were taken with                            a cold forceps for histology. Estimated blood loss                            was minimal.                           The exam of the esophagus was otherwise normal.                           A large paraesophageal hernia was found. The                            proximal extent of the gastric folds (end of                            tubular esophagus) was 35 cm from the incisors. The  hiatal narrowing was 40 cm from the incisors. The                            Z-line was 35 cm from the incisors.                           Diffuse granular mucosa was found in the entire                            examined stomach. Biopsies were taken with a cold                            forceps for Helicobacter pylori testing. Estimated                            blood loss was minimal.                           The examined duodenum was normal. Complications:            No immediate complications. Estimated Blood Loss:     Estimated blood loss was minimal. Impression:               - Tortuous esophagus.                           - Discolored mucosa in the esophagus. Biopsied.                            Suspect this may be mucosal irritation from food                            stasis. Changes not suggestive of reflux                            esophagitis. No stricture appreciated.                           - Large paraesophageal hernia.                            - Granular gastric mucosa. Biopsied.                           - Normal examined duodenum. Recommendation:           - Patient has a contact number available for                            emergencies. The signs and symptoms of potential                            delayed complications were discussed with the                            patient. Return to normal activities tomorrow.  Written discharge instructions were provided to the                            patient.                           - Resume previous diet.                           - Continue present medications.                           - Await pathology results.                           - Follow up with Dr. Sheldon to discuss hernia                            reduction. Stacy Hattabaugh E. Stacia, MD 08/26/2023 10:15:46 AM This report has been signed electronically.

## 2023-08-27 ENCOUNTER — Telehealth: Payer: Self-pay

## 2023-08-27 NOTE — Telephone Encounter (Signed)
  Follow up Call-     08/26/2023    9:08 AM 10/08/2021    7:18 AM  Call back number  Post procedure Call Back phone  # [phone]  Permission to leave phone message Yes Yes     Patient questions:  Do you have a fever, pain , or abdominal swelling? No. Pain Score  0 *  Have you tolerated food without any problems? Yes.    Have you been able to return to your normal activities? Yes.    Do you have any questions about your discharge instructions: Diet   No. Medications  No. Follow up visit  No.  Do you have questions or concerns about your Care? No.  Actions: * If pain score is 4 or above: No action needed, pain <4.  Pt states her stomach was quite ouchy yesterday but by last evening she was feeling better and today she is back to normal.  Pt has been able to tolerate food and drink w/out difficulty.

## 2023-08-30 ENCOUNTER — Ambulatory Visit: Payer: Self-pay | Admitting: Gastroenterology

## 2023-08-30 LAB — SURGICAL PATHOLOGY

## 2023-08-30 NOTE — Progress Notes (Signed)
 Stacy Boyd,  The biopsies taken from your stomach were notable for mild reactive gastropathy which is a common finding and often related to use of certain medications (usually NSAIDs), but there was no evidence of Helicobacter pylori infection. This common finding is not felt to necessarily be a cause of any particular symptom and there is no specific treatment or further evaluation recommended.  The biopsies of your esophagus showed inflammatory changes consistent with acid reflux.  No precancerous changes were seen and no evidence of infectious esophagitis was seen.  I recommend you discuss surgical correction of your large hiatal hernia, as discussed

## 2023-09-06 ENCOUNTER — Ambulatory Visit: Payer: Self-pay | Admitting: Surgery

## 2023-09-06 DIAGNOSIS — R1319 Other dysphagia: Secondary | ICD-10-CM | POA: Diagnosis not present

## 2023-09-06 DIAGNOSIS — K440 Diaphragmatic hernia with obstruction, without gangrene: Secondary | ICD-10-CM | POA: Diagnosis not present

## 2023-09-15 ENCOUNTER — Telehealth: Payer: Self-pay | Admitting: Gastroenterology

## 2023-09-15 ENCOUNTER — Other Ambulatory Visit: Payer: Self-pay | Admitting: *Deleted

## 2023-09-15 DIAGNOSIS — K449 Diaphragmatic hernia without obstruction or gangrene: Secondary | ICD-10-CM

## 2023-09-15 DIAGNOSIS — R1310 Dysphagia, unspecified: Secondary | ICD-10-CM

## 2023-09-15 NOTE — Telephone Encounter (Signed)
 Esophageal manometry orders placed. Will contact hospital endo scheduling tomorrow after they open up for the day.

## 2023-09-15 NOTE — Telephone Encounter (Signed)
 Inbound call from patients states [facility] will be sending over a referral for her to be seen for manometry with Dr Shila. Requesting to speak with a nurse. Please advise.   Thank you

## 2023-09-15 NOTE — Telephone Encounter (Signed)
 OK to schedule patient for esophageal manometry first available as recommended by Dr Sheldon 09/06/23?

## 2023-09-16 NOTE — Telephone Encounter (Signed)
 Patient has been scheduled for esophageal manometry at [facility] for first available date, 12/15/23 at 8:30 am, 8:00 am arrival. Patient has been advised of this information, is given generalized verbal prep instructions and is advised that written manometry prep instructions have been made available through her mychart account. Patient verbalizes understanding and states Dr Sheldon may want this done sooner. I explained that we will certainly send a note to Dr Sheldon with this information and if she is able to get in sooner at a tertiary care clinic, she should let us  know and we will then cancel the manometry at [facility].

## 2023-09-29 DIAGNOSIS — E039 Hypothyroidism, unspecified: Secondary | ICD-10-CM | POA: Diagnosis not present

## 2023-09-29 DIAGNOSIS — N951 Menopausal and female climacteric states: Secondary | ICD-10-CM | POA: Diagnosis not present

## 2023-10-01 ENCOUNTER — Other Ambulatory Visit: Payer: PPO

## 2023-10-01 DIAGNOSIS — Z79890 Hormone replacement therapy (postmenopausal): Secondary | ICD-10-CM | POA: Diagnosis not present

## 2023-10-01 DIAGNOSIS — G479 Sleep disorder, unspecified: Secondary | ICD-10-CM | POA: Diagnosis not present

## 2023-10-01 DIAGNOSIS — M2550 Pain in unspecified joint: Secondary | ICD-10-CM | POA: Diagnosis not present

## 2023-10-01 DIAGNOSIS — E039 Hypothyroidism, unspecified: Secondary | ICD-10-CM | POA: Diagnosis not present

## 2023-10-01 DIAGNOSIS — R5383 Other fatigue: Secondary | ICD-10-CM | POA: Diagnosis not present

## 2023-10-01 DIAGNOSIS — R6882 Decreased libido: Secondary | ICD-10-CM | POA: Diagnosis not present

## 2023-10-01 DIAGNOSIS — N951 Menopausal and female climacteric states: Secondary | ICD-10-CM | POA: Diagnosis not present

## 2023-10-01 DIAGNOSIS — Z6832 Body mass index (BMI) 32.0-32.9, adult: Secondary | ICD-10-CM | POA: Diagnosis not present

## 2023-10-06 ENCOUNTER — Telehealth: Payer: Self-pay | Admitting: Gastroenterology

## 2023-10-06 NOTE — Telephone Encounter (Signed)
 Appt cancelled as requested.

## 2023-10-06 NOTE — Telephone Encounter (Signed)
 Inbound call from [facility] stating patient is going to [facility] for earlier availability for Manometry surgery. Will like to cancel the procedure scheduled for 12/15/23  Please advise  Thank you

## 2023-10-08 ENCOUNTER — Encounter: Payer: PPO | Admitting: Family Medicine

## 2023-11-05 DIAGNOSIS — K440 Diaphragmatic hernia with obstruction, without gangrene: Secondary | ICD-10-CM | POA: Diagnosis not present

## 2023-11-05 DIAGNOSIS — R1319 Other dysphagia: Secondary | ICD-10-CM | POA: Diagnosis not present

## 2023-11-12 DIAGNOSIS — M810 Age-related osteoporosis without current pathological fracture: Secondary | ICD-10-CM | POA: Diagnosis not present

## 2023-11-12 DIAGNOSIS — Z79899 Other long term (current) drug therapy: Secondary | ICD-10-CM | POA: Diagnosis not present

## 2023-11-12 DIAGNOSIS — E039 Hypothyroidism, unspecified: Secondary | ICD-10-CM | POA: Diagnosis not present

## 2023-11-12 DIAGNOSIS — M3500 Sicca syndrome, unspecified: Secondary | ICD-10-CM | POA: Diagnosis not present

## 2023-11-12 LAB — COMPREHENSIVE METABOLIC PANEL WITH GFR: EGFR: 61

## 2023-11-17 NOTE — Patient Instructions (Signed)
 SURGICAL WAITING ROOM VISITATION  Patients having surgery or a procedure may have no more than 2 support people in the waiting area - these visitors may rotate.    Children under the age of 31 must have an adult with them who is not the patient.  Visitors with respiratory illnesses are discouraged from visiting and should remain at home.  If the patient needs to stay at the hospital during part of their recovery, the visitor guidelines for inpatient rooms apply. Pre-op nurse will coordinate an appropriate time for 1 support person to accompany patient in pre-op.  This support person may not rotate.    Please refer to the [facility] website for the visitor guidelines for Inpatients (after your surgery is over and you are in a regular room).    Your procedure is scheduled on: 11/25/23   Report to [facility] Main Entrance    Report to admitting at 5:15 AM   Call this number if you have problems the morning of surgery [phone]   Do not eat food :After Midnight.   After Midnight you may have the following liquids until ______ AM/ PM DAY OF SURGERY  Water  Non-Citrus Juices (without pulp, NO RED-Apple, White grape, White cranberry) Black Coffee (NO MILK/CREAM OR CREAMERS, sugar ok)  Clear Tea (NO MILK/CREAM OR CREAMERS, sugar ok) regular and decaf                             Plain Jell-O (NO RED)                                           Fruit ices (not with fruit pulp, NO RED)                                     Popsicles (NO RED)                                                               Sports drinks like Gatorade (NO RED)              Drink 2 Ensure/G2 drinks AT 10:00 PM the night before surgery.        The day of surgery:  Drink ONE (1) Pre-Surgery Clear Ensure or G2 at AM the morning of surgery. Drink in one sitting. Do not sip.  This drink was given to you during your hospital  pre-op appointment visit. Nothing else to drink after completing the   Pre-Surgery Clear Ensure or G2.          If you have questions, please contact your surgeon's office.   FOLLOW BOWEL PREP AND ANY ADDITIONAL PRE OP INSTRUCTIONS YOU RECEIVED FROM YOUR SURGEON'S OFFICE!!!     Oral Hygiene is also important to reduce your risk of infection.                                    Remember - BRUSH YOUR TEETH THE MORNING OF SURGERY  WITH YOUR REGULAR TOOTHPASTE  DENTURES WILL BE REMOVED PRIOR TO SURGERY PLEASE DO NOT APPLY Poly grip OR ADHESIVES!!!   Do NOT smoke after Midnight   Stop all vitamins and herbal supplements 7 days before surgery.   Take these medicines the morning of surgery with A SIP OF WATER : Allegra, Levothyroxine   DO NOT TAKE ANY ORAL DIABETIC MEDICATIONS DAY OF YOUR SURGERY  Bring CPAP mask and tubing day of surgery.                              You may not have any metal on your body including hair pins, jewelry, and body piercing             Do not wear make-up, lotions, powders, perfumes/cologne, or deodorant  Do not wear nail polish including gel and S&S, artificial/acrylic nails, or any other type of covering on natural nails including finger and toenails. If you have artificial nails, gel coating, etc. that needs to be removed by a nail salon please have this removed prior to surgery or surgery may need to be canceled/ delayed if the surgeon/ anesthesia feels like they are unable to be safely monitored.   Do not shave  48 hours prior to surgery.    Do not bring valuables to the hospital. [facility] IS NOT             RESPONSIBLE   FOR VALUABLES.   Contacts, glasses, dentures or bridgework may not be worn into surgery.   Bring small overnight bag day of surgery.   DO NOT BRING YOUR HOME MEDICATIONS TO THE HOSPITAL. PHARMACY WILL DISPENSE MEDICATIONS LISTED ON YOUR MEDICATION LIST TO YOU DURING YOUR ADMISSION IN THE HOSPITAL!   Special Instructions: Bring a copy of your healthcare power of attorney and living will documents  the day of surgery if you haven't scanned them before.              Please read over the following fact sheets you were given: IF YOU HAVE QUESTIONS ABOUT YOUR PRE-OP INSTRUCTIONS PLEASE CALL [phone].   If you received a COVID test during your pre-op visit  it is requested that you wear a mask when out in public, stay away from anyone that may not be feeling well and notify your surgeon if you develop symptoms. If you test positive for Covid or have been in contact with anyone that has tested positive in the last 10 days please notify you surgeon.    [facility] - Preparing for Surgery Before surgery, you can play an important role.  Because skin is not sterile, your skin needs to be as free of germs as possible.  You can reduce the number of germs on your skin by washing with CHG (chlorahexidine gluconate) soap before surgery.  CHG is an antiseptic cleaner which kills germs and bonds with the skin to continue killing germs even after washing. Please DO NOT use if you have an allergy to CHG or antibacterial soaps.  If your skin becomes reddened/irritated stop using the CHG and inform your nurse when you arrive at Short Stay. Do not shave (including legs and underarms) for at least 48 hours prior to the first CHG shower.  You may shave your face/neck.  Please follow these instructions carefully:  1.  Shower with CHG Soap the night before surgery ONLY (DO NOT USE THE SOAP THE MORNING OF SURGERY).  2.  If you choose to wash your hair, wash your hair first as usual with your normal  shampoo.  3.  After you shampoo, rinse your hair and body thoroughly to remove the shampoo.                             4.  Use CHG as you would any other liquid soap.  You can apply chg directly to the skin and wash.  Gently with a scrungie or clean washcloth.  5.  Apply the CHG Soap to your body ONLY FROM THE NECK DOWN.   Do   not use on face/ open                           Wound or open sores. Avoid contact  with eyes, ears mouth and   genitals (private parts).                       Wash face,  Genitals (private parts) with your normal soap.             6.  Wash thoroughly, paying special attention to the area where your    surgery  will be performed.  7.  Thoroughly rinse your body with warm water  from the neck down.  8.  DO NOT shower/wash with your normal soap after using and rinsing off the CHG Soap.                9.  Pat yourself dry with a clean towel.            10.  Wear clean pajamas.            11.  Place clean sheets on your bed the night of your first shower and do not  sleep with pets. Day of Surgery : Do not apply any CHG, lotions/deodorants the morning of surgery.  Please wear clean clothes to the hospital/surgery center.  FAILURE TO FOLLOW THESE INSTRUCTIONS MAY RESULT IN THE CANCELLATION OF YOUR SURGERY  PATIENT SIGNATURE_________________________________  NURSE SIGNATURE__________________________________  ________________________________________________________________________

## 2023-11-17 NOTE — Progress Notes (Signed)
 COVID Vaccine Completed: yes  Date of COVID positive in last 90 days:  PCP - Toribio Slain, MD Cardiologist - n/a  Chest x-ray - N/A EKG - N/A Stress Test - more than 20 yrs ago- no issues ECHO - more than 20 yrs ago- no issues Cardiac Cath - n/a Pacemaker/ICD device last checked:N/A Spinal Cord Stimulator:N/A  Bowel Prep - N/A  Sleep Study - N/A CPAP -   Fasting Blood Sugar - preDM, no meds or checks at home Checks Blood Sugar _____ times a day  Last dose of GLP1 agonist-  N/A GLP1 instructions:  Do not take after     Last dose of SGLT-2 inhibitors-  N/A SGLT-2 instructions:  Do not take after     Blood Thinner Instructions: N/A Last dose:   Time: Aspirin  Instructions:N/A Last Dose:  Activity level: Can go up a flight of stairs and perform activities of daily living without stopping and without symptoms of chest pain or shortness of breath.   Anesthesia review: N/A  Patient denies shortness of breath, fever, cough and chest pain at PAT appointment  Patient verbalized understanding of instructions that were given to them at the PAT appointment. Patient was also instructed that they will need to review over the PAT instructions again at home before surgery.

## 2023-11-18 ENCOUNTER — Other Ambulatory Visit: Payer: Self-pay

## 2023-11-18 ENCOUNTER — Encounter (HOSPITAL_COMMUNITY): Payer: Self-pay

## 2023-11-18 ENCOUNTER — Encounter (HOSPITAL_COMMUNITY)
Admission: RE | Admit: 2023-11-18 | Discharge: 2023-11-18 | Disposition: A | Discharge status: Home / Self Care | Source: Ambulatory Visit | Attending: Surgery | Admitting: Surgery

## 2023-11-18 VITALS — BP 144/78 | HR 71 | Temp 97.8°F | Resp 16 | Ht 66.0 in | Wt 203.0 lb

## 2023-11-18 DIAGNOSIS — Z01812 Encounter for preprocedural laboratory examination: Secondary | ICD-10-CM | POA: Insufficient documentation

## 2023-11-18 DIAGNOSIS — Z01818 Encounter for other preprocedural examination: Secondary | ICD-10-CM

## 2023-11-18 LAB — CBC
HCT: 44 % (ref 36.0–46.0)
Hemoglobin: 14.7 g/dL (ref 12.0–15.0)
MCH: 31.5 pg (ref 26.0–34.0)
MCHC: 33.4 g/dL (ref 30.0–36.0)
MCV: 94.2 fL (ref 80.0–100.0)
Platelets: 321 K/uL (ref 150–400)
RBC: 4.67 MIL/uL (ref 3.87–5.11)
RDW: 12.8 % (ref 11.5–15.5)
WBC: 6.7 K/uL (ref 4.0–10.5)
nRBC: 0 % (ref 0.0–0.2)

## 2023-11-19 DIAGNOSIS — M8580 Other specified disorders of bone density and structure, unspecified site: Secondary | ICD-10-CM | POA: Diagnosis not present

## 2023-11-19 DIAGNOSIS — M1990 Unspecified osteoarthritis, unspecified site: Secondary | ICD-10-CM | POA: Diagnosis not present

## 2023-11-19 DIAGNOSIS — M8589 Other specified disorders of bone density and structure, multiple sites: Secondary | ICD-10-CM | POA: Diagnosis not present

## 2023-11-19 DIAGNOSIS — R61 Generalized hyperhidrosis: Secondary | ICD-10-CM | POA: Diagnosis not present

## 2023-11-19 DIAGNOSIS — M3500 Sicca syndrome, unspecified: Secondary | ICD-10-CM | POA: Diagnosis not present

## 2023-11-22 ENCOUNTER — Other Ambulatory Visit: Payer: Self-pay | Admitting: Family Medicine

## 2023-11-22 DIAGNOSIS — R5383 Other fatigue: Secondary | ICD-10-CM

## 2023-11-22 DIAGNOSIS — M069 Rheumatoid arthritis, unspecified: Secondary | ICD-10-CM

## 2023-11-22 DIAGNOSIS — M3500 Sicca syndrome, unspecified: Secondary | ICD-10-CM

## 2023-11-24 NOTE — Anesthesia Preprocedure Evaluation (Addendum)
 Anesthesia Evaluation  Patient identified by MRN, date of birth, ID band Patient awake    Reviewed: Allergy & Precautions, NPO status , Patient's Chart, lab work & pertinent test results  Airway Mallampati: II  TM Distance: >3 FB Neck ROM: Full    Dental no notable dental hx. (+) Teeth Intact, Dental Advisory Given   Pulmonary neg pulmonary ROS   Pulmonary exam normal breath sounds clear to auscultation       Cardiovascular negative cardio ROS Normal cardiovascular exam Rhythm:Regular Rate:Normal     Neuro/Psych negative neurological ROS  negative psych ROS   GI/Hepatic Neg liver ROS, hiatal hernia,GERD  ,,  Endo/Other  Hypothyroidism  Sjogren's  Renal/GU negative Renal ROS  negative genitourinary   Musculoskeletal  (+) Arthritis , Osteoarthritis,    Abdominal   Peds  Hematology negative hematology ROS (+)   Anesthesia Other Findings   Reproductive/Obstetrics                              Anesthesia Physical Anesthesia Plan  ASA: 2  Anesthesia Plan: General   Post-op Pain Management: Tylenol  PO (pre-op)*   Induction: Intravenous  PONV Risk Score and Plan: 3 and Dexamethasone , Ondansetron  and Treatment may vary due to age or medical condition  Airway Management Planned: Oral ETT  Additional Equipment:   Intra-op Plan:   Post-operative Plan: Extubation in OR  Informed Consent: I have reviewed the patients History and Physical, chart, labs and discussed the procedure including the risks, benefits and alternatives for the proposed anesthesia with the patient or authorized representative who has indicated his/her understanding and acceptance.     Dental advisory given  Plan Discussed with: CRNA  Anesthesia Plan Comments: (2 IVs)         Anesthesia Quick Evaluation

## 2023-11-25 ENCOUNTER — Encounter (HOSPITAL_COMMUNITY): Payer: Self-pay | Admitting: Surgery

## 2023-11-25 ENCOUNTER — Inpatient Hospital Stay (HOSPITAL_COMMUNITY)
Admission: RE | Admit: 2023-11-25 | Discharge: 2023-11-27 | DRG: 328 | Disposition: A | Discharge status: Home / Self Care | Attending: Surgery | Admitting: Surgery

## 2023-11-25 ENCOUNTER — Ambulatory Visit (HOSPITAL_COMMUNITY): Payer: Self-pay | Admitting: Physician Assistant

## 2023-11-25 ENCOUNTER — Encounter (HOSPITAL_COMMUNITY)
Admission: RE | Disposition: A | Discharge status: Home / Self Care | Payer: Self-pay | Source: Home / Self Care | Attending: Surgery

## 2023-11-25 ENCOUNTER — Other Ambulatory Visit: Payer: Self-pay

## 2023-11-25 ENCOUNTER — Ambulatory Visit (HOSPITAL_COMMUNITY): Admitting: Certified Registered"

## 2023-11-25 DIAGNOSIS — Z803 Family history of malignant neoplasm of breast: Secondary | ICD-10-CM

## 2023-11-25 DIAGNOSIS — K449 Diaphragmatic hernia without obstruction or gangrene: Secondary | ICD-10-CM

## 2023-11-25 DIAGNOSIS — E669 Obesity, unspecified: Secondary | ICD-10-CM | POA: Diagnosis not present

## 2023-11-25 DIAGNOSIS — F419 Anxiety disorder, unspecified: Secondary | ICD-10-CM | POA: Diagnosis present

## 2023-11-25 DIAGNOSIS — K2970 Gastritis, unspecified, without bleeding: Secondary | ICD-10-CM | POA: Diagnosis not present

## 2023-11-25 DIAGNOSIS — R1314 Dysphagia, pharyngoesophageal phase: Secondary | ICD-10-CM | POA: Diagnosis not present

## 2023-11-25 DIAGNOSIS — Z8042 Family history of malignant neoplasm of prostate: Secondary | ICD-10-CM | POA: Diagnosis not present

## 2023-11-25 DIAGNOSIS — Z823 Family history of stroke: Secondary | ICD-10-CM

## 2023-11-25 DIAGNOSIS — Z791 Long term (current) use of non-steroidal anti-inflammatories (NSAID): Secondary | ICD-10-CM

## 2023-11-25 DIAGNOSIS — Z833 Family history of diabetes mellitus: Secondary | ICD-10-CM

## 2023-11-25 DIAGNOSIS — Z800 Family history of malignant neoplasm of digestive organs: Secondary | ICD-10-CM

## 2023-11-25 DIAGNOSIS — Z83438 Family history of other disorder of lipoprotein metabolism and other lipidemia: Secondary | ICD-10-CM

## 2023-11-25 DIAGNOSIS — Z8249 Family history of ischemic heart disease and other diseases of the circulatory system: Secondary | ICD-10-CM

## 2023-11-25 DIAGNOSIS — H04123 Dry eye syndrome of bilateral lacrimal glands: Secondary | ICD-10-CM | POA: Diagnosis not present

## 2023-11-25 DIAGNOSIS — Z79890 Hormone replacement therapy (postmenopausal): Secondary | ICD-10-CM | POA: Diagnosis not present

## 2023-11-25 DIAGNOSIS — E039 Hypothyroidism, unspecified: Secondary | ICD-10-CM | POA: Diagnosis present

## 2023-11-25 DIAGNOSIS — K319 Disease of stomach and duodenum, unspecified: Secondary | ICD-10-CM | POA: Diagnosis present

## 2023-11-25 DIAGNOSIS — M159 Polyosteoarthritis, unspecified: Secondary | ICD-10-CM | POA: Diagnosis present

## 2023-11-25 DIAGNOSIS — Z822 Family history of deafness and hearing loss: Secondary | ICD-10-CM | POA: Diagnosis not present

## 2023-11-25 DIAGNOSIS — Z6832 Body mass index (BMI) 32.0-32.9, adult: Secondary | ICD-10-CM | POA: Diagnosis not present

## 2023-11-25 DIAGNOSIS — K440 Diaphragmatic hernia with obstruction, without gangrene: Secondary | ICD-10-CM | POA: Diagnosis not present

## 2023-11-25 DIAGNOSIS — Z881 Allergy status to other antibiotic agents status: Secondary | ICD-10-CM | POA: Diagnosis not present

## 2023-11-25 DIAGNOSIS — Z96653 Presence of artificial knee joint, bilateral: Secondary | ICD-10-CM | POA: Diagnosis present

## 2023-11-25 DIAGNOSIS — M3500 Sicca syndrome, unspecified: Secondary | ICD-10-CM | POA: Diagnosis present

## 2023-11-25 DIAGNOSIS — K2100 Gastro-esophageal reflux disease with esophagitis, without bleeding: Secondary | ICD-10-CM | POA: Diagnosis not present

## 2023-11-25 DIAGNOSIS — E7800 Pure hypercholesterolemia, unspecified: Secondary | ICD-10-CM | POA: Diagnosis not present

## 2023-11-25 DIAGNOSIS — M069 Rheumatoid arthritis, unspecified: Secondary | ICD-10-CM | POA: Diagnosis not present

## 2023-11-25 DIAGNOSIS — Z90710 Acquired absence of both cervix and uterus: Secondary | ICD-10-CM

## 2023-11-25 DIAGNOSIS — R7303 Prediabetes: Secondary | ICD-10-CM | POA: Diagnosis present

## 2023-11-25 HISTORY — PX: XI ROBOTIC ASSISTED PARAESOPHAGEAL HERNIA REPAIR: SHX6871

## 2023-11-25 SURGERY — REPAIR, HERNIA, PARAESOPHAGEAL, ROBOT-ASSISTED
Anesthesia: General

## 2023-11-25 MED ORDER — MAGNESIUM HYDROXIDE 400 MG/5ML PO SUSP: 30.0000 mL | Freq: Every day | ORAL | Status: DC | PRN

## 2023-11-25 MED ORDER — CHLORHEXIDINE GLUCONATE CLOTH 2 % EX PADS: 6.0000 | MEDICATED_PAD | Freq: Once | CUTANEOUS | Status: DC

## 2023-11-25 MED ORDER — SUGAMMADEX SODIUM 200 MG/2ML IV SOLN
INTRAVENOUS | Status: AC
  Filled 2023-11-25: qty 2

## 2023-11-25 MED ORDER — FAMOTIDINE-CA CARB-MAG HYDROX 10-800-165 MG PO CHEW: 1.0000 | CHEWABLE_TABLET | Freq: Every day | ORAL | Status: DC

## 2023-11-25 MED ORDER — FENTANYL CITRATE (PF) 50 MCG/ML IJ SOSY
PREFILLED_SYRINGE | INTRAMUSCULAR | Status: AC
  Filled 2023-11-25: qty 1

## 2023-11-25 MED ORDER — ONDANSETRON HCL 4 MG PO TABS: 4.0000 mg | ORAL_TABLET | Freq: Three times a day (TID) | ORAL | 5 refills | Status: AC | PRN

## 2023-11-25 MED ORDER — MENTHOL 3 MG MT LOZG: 1.0000 | LOZENGE | OROMUCOSAL | Status: DC | PRN

## 2023-11-25 MED ORDER — ALUM & MAG HYDROXIDE-SIMETH 200-200-20 MG/5ML PO SUSP
30.0000 mL | Freq: Every day | ORAL | Status: DC
  Administered 2023-11-25 – 2023-11-26 (×2): 30 mL via ORAL
  Filled 2023-11-25 (×2): qty 30

## 2023-11-25 MED ORDER — SODIUM CHLORIDE 0.9 % IV SOLN: 250.0000 mL | INTRAVENOUS | Status: DC | PRN

## 2023-11-25 MED ORDER — MIDAZOLAM HCL 2 MG/2ML IJ SOLN
INTRAMUSCULAR | Status: AC
  Filled 2023-11-25: qty 2

## 2023-11-25 MED ORDER — TRAMADOL HCL 50 MG PO TABS
50.0000 mg | ORAL_TABLET | Freq: Four times a day (QID) | ORAL | Status: DC | PRN
  Administered 2023-11-25: 100 mg via ORAL
  Administered 2023-11-26 – 2023-11-27 (×3): 50 mg via ORAL
  Filled 2023-11-25 (×3): qty 1
  Filled 2023-11-25: qty 2

## 2023-11-25 MED ORDER — LIOTHYRONINE SODIUM 5 MCG PO TABS
10.0000 ug | ORAL_TABLET | Freq: Every day | ORAL | Status: DC
  Administered 2023-11-26 – 2023-11-27 (×2): 10 ug via ORAL
  Filled 2023-11-25 (×2): qty 2

## 2023-11-25 MED ORDER — METOPROLOL TARTRATE 5 MG/5ML IV SOLN: 5.0000 mg | Freq: Four times a day (QID) | INTRAVENOUS | Status: DC | PRN

## 2023-11-25 MED ORDER — FENTANYL CITRATE (PF) 100 MCG/2ML IJ SOLN
INTRAMUSCULAR | Status: AC
  Filled 2023-11-25: qty 2

## 2023-11-25 MED ORDER — FENTANYL CITRATE (PF) 50 MCG/ML IJ SOSY
25.0000 ug | PREFILLED_SYRINGE | INTRAMUSCULAR | Status: DC | PRN
  Administered 2023-11-25: 50 ug via INTRAVENOUS
  Administered 2023-11-25: 25 ug via INTRAVENOUS
  Administered 2023-11-25: 50 ug via INTRAVENOUS
  Administered 2023-11-25: 25 ug via INTRAVENOUS

## 2023-11-25 MED ORDER — BUPIVACAINE-EPINEPHRINE (PF) 0.25% -1:200000 IJ SOLN
INTRAMUSCULAR | Status: AC
  Filled 2023-11-25: qty 60

## 2023-11-25 MED ORDER — PANTOPRAZOLE SODIUM 40 MG PO TBEC
40.0000 mg | DELAYED_RELEASE_TABLET | Freq: Every day | ORAL | Status: DC
  Administered 2023-11-26: 40 mg via ORAL
  Filled 2023-11-25: qty 1

## 2023-11-25 MED ORDER — ROCURONIUM BROMIDE 10 MG/ML (PF) SYRINGE
PREFILLED_SYRINGE | INTRAVENOUS | Status: AC
  Filled 2023-11-25: qty 10

## 2023-11-25 MED ORDER — MIDAZOLAM HCL (PF) 2 MG/2ML IJ SOLN
INTRAMUSCULAR | Status: DC | PRN
  Administered 2023-11-25: 2 mg via INTRAVENOUS

## 2023-11-25 MED ORDER — KETAMINE HCL 50 MG/5ML IJ SOSY
PREFILLED_SYRINGE | INTRAMUSCULAR | Status: AC
  Filled 2023-11-25: qty 5

## 2023-11-25 MED ORDER — LIDOCAINE HCL (PF) 2 % IJ SOLN
INTRAMUSCULAR | Status: AC
  Filled 2023-11-25: qty 5

## 2023-11-25 MED ORDER — ENSURE PRE-SURGERY PO LIQD: 296.0000 mL | Freq: Once | ORAL | Status: DC

## 2023-11-25 MED ORDER — PHENYLEPHRINE 80 MCG/ML (10ML) SYRINGE FOR IV PUSH (FOR BLOOD PRESSURE SUPPORT)
PREFILLED_SYRINGE | INTRAVENOUS | Status: DC | PRN
  Administered 2023-11-25: 80 ug via INTRAVENOUS

## 2023-11-25 MED ORDER — SODIUM CHLORIDE 0.9% FLUSH: 3.0000 mL | INTRAVENOUS | Status: DC | PRN

## 2023-11-25 MED ORDER — KCL IN DEXTROSE-NACL 20-5-0.45 MEQ/L-%-% IV SOLN
INTRAVENOUS | Status: DC
  Filled 2023-11-25: qty 1000

## 2023-11-25 MED ORDER — LACTATED RINGERS IR SOLN
Status: DC | PRN
  Administered 2023-11-25: 1000 mL

## 2023-11-25 MED ORDER — ONDANSETRON HCL 4 MG/2ML IJ SOLN
INTRAMUSCULAR | Status: AC
  Filled 2023-11-25: qty 2

## 2023-11-25 MED ORDER — PHENYLEPHRINE HCL-NACL 20-0.9 MG/250ML-% IV SOLN
INTRAVENOUS | Status: DC | PRN
  Administered 2023-11-25: 20 ug/min via INTRAVENOUS

## 2023-11-25 MED ORDER — ACETAMINOPHEN 500 MG PO TABS
1000.0000 mg | ORAL_TABLET | Freq: Once | ORAL | Status: DC
  Administered 2023-11-25: 1000 mg via ORAL

## 2023-11-25 MED ORDER — ROCURONIUM BROMIDE 10 MG/ML (PF) SYRINGE
PREFILLED_SYRINGE | INTRAVENOUS | Status: DC | PRN
  Administered 2023-11-25 (×3): 20 mg via INTRAVENOUS
  Administered 2023-11-25: 50 mg via INTRAVENOUS

## 2023-11-25 MED ORDER — PROPOFOL 10 MG/ML IV BOLUS
INTRAVENOUS | Status: AC
  Filled 2023-11-25: qty 20

## 2023-11-25 MED ORDER — SODIUM CHLORIDE 0.9 % IV SOLN
2.0000 g | INTRAVENOUS | Status: AC
  Administered 2023-11-25: 2 g via INTRAVENOUS
  Filled 2023-11-25: qty 20

## 2023-11-25 MED ORDER — TRAMADOL HCL 50 MG PO TABS: 50.0000 mg | ORAL_TABLET | Freq: Four times a day (QID) | ORAL | 0 refills | Status: DC | PRN

## 2023-11-25 MED ORDER — ACETAMINOPHEN 500 MG PO TABS
1000.0000 mg | ORAL_TABLET | Freq: Four times a day (QID) | ORAL | Status: DC
  Administered 2023-11-25 – 2023-11-27 (×6): 1000 mg via ORAL
  Filled 2023-11-25 (×6): qty 2

## 2023-11-25 MED ORDER — SODIUM CHLORIDE 0.9% FLUSH
3.0000 mL | Freq: Two times a day (BID) | INTRAVENOUS | Status: DC
  Administered 2023-11-25 – 2023-11-27 (×4): 3 mL via INTRAVENOUS

## 2023-11-25 MED ORDER — HYDROMORPHONE HCL 1 MG/ML IJ SOLN: 0.5000 mg | INTRAMUSCULAR | Status: DC | PRN

## 2023-11-25 MED ORDER — BISACODYL 10 MG RE SUPP: 10.0000 mg | Freq: Every day | RECTAL | Status: DC | PRN

## 2023-11-25 MED ORDER — LACTATED RINGERS IV SOLN: Freq: Three times a day (TID) | INTRAVENOUS | Status: DC | PRN

## 2023-11-25 MED ORDER — PHENOL 1.4 % MT LIQD: 2.0000 | OROMUCOSAL | Status: DC | PRN

## 2023-11-25 MED ORDER — AMISULPRIDE (ANTIEMETIC) 5 MG/2ML IV SOLN: 10.0000 mg | Freq: Once | INTRAVENOUS | Status: DC | PRN

## 2023-11-25 MED ORDER — LACTATED RINGERS IV BOLUS
1000.0000 mL | Freq: Three times a day (TID) | INTRAVENOUS | Status: DC | PRN
  Administered 2023-11-25: 1000 mL via INTRAVENOUS

## 2023-11-25 MED ORDER — ENSURE PRE-SURGERY PO LIQD: 592.0000 mL | Freq: Once | ORAL | Status: DC

## 2023-11-25 MED ORDER — SIMETHICONE 80 MG PO CHEW: 40.0000 mg | CHEWABLE_TABLET | Freq: Four times a day (QID) | ORAL | Status: DC | PRN

## 2023-11-25 MED ORDER — POLYVINYL ALCOHOL 1.4 % OP SOLN: 1.0000 [drp] | Freq: Four times a day (QID) | OPHTHALMIC | Status: DC | PRN

## 2023-11-25 MED ORDER — FAMOTIDINE 20 MG PO TABS
10.0000 mg | ORAL_TABLET | Freq: Every day | ORAL | Status: DC
  Administered 2023-11-25 – 2023-11-26 (×2): 10 mg via ORAL
  Filled 2023-11-25 (×2): qty 1

## 2023-11-25 MED ORDER — PROPOFOL 10 MG/ML IV BOLUS
INTRAVENOUS | Status: DC | PRN
  Administered 2023-11-25: 120 mg via INTRAVENOUS

## 2023-11-25 MED ORDER — ORAL CARE MOUTH RINSE: 15.0000 mL | Freq: Once | OROMUCOSAL | Status: AC

## 2023-11-25 MED ORDER — BUPIVACAINE-EPINEPHRINE 0.25% -1:200000 IJ SOLN
INTRAMUSCULAR | Status: DC | PRN
  Administered 2023-11-25: 60 mL

## 2023-11-25 MED ORDER — PROCHLORPERAZINE MALEATE 10 MG PO TABS: 10.0000 mg | ORAL_TABLET | Freq: Four times a day (QID) | ORAL | Status: DC | PRN

## 2023-11-25 MED ORDER — LORATADINE 10 MG PO TABS
10.0000 mg | ORAL_TABLET | Freq: Every day | ORAL | Status: DC
  Administered 2023-11-26 – 2023-11-27 (×2): 10 mg via ORAL
  Filled 2023-11-25 (×2): qty 1

## 2023-11-25 MED ORDER — OXYCODONE HCL 5 MG PO TABS
ORAL_TABLET | ORAL | Status: AC
  Filled 2023-11-25: qty 1

## 2023-11-25 MED ORDER — DEXAMETHASONE SOD PHOSPHATE PF 10 MG/ML IJ SOLN
4.0000 mg | INTRAMUSCULAR | Status: AC
  Administered 2023-11-25: 8 mg via INTRAVENOUS

## 2023-11-25 MED ORDER — ACETAMINOPHEN 500 MG PO TABS
1000.0000 mg | ORAL_TABLET | ORAL | Status: DC
  Filled 2023-11-25: qty 2

## 2023-11-25 MED ORDER — METHOCARBAMOL 500 MG PO TABS
500.0000 mg | ORAL_TABLET | Freq: Four times a day (QID) | ORAL | Status: DC | PRN
  Administered 2023-11-25 – 2023-11-26 (×3): 500 mg via ORAL
  Filled 2023-11-25 (×3): qty 1

## 2023-11-25 MED ORDER — PROGESTERONE MICRONIZED 100 MG PO CAPS
200.0000 mg | ORAL_CAPSULE | Freq: Every day | ORAL | Status: DC
  Administered 2023-11-25 – 2023-11-26 (×2): 200 mg via ORAL
  Filled 2023-11-25 (×2): qty 2

## 2023-11-25 MED ORDER — CYCLOSPORINE 0.05 % OP EMUL
1.0000 [drp] | Freq: Two times a day (BID) | OPHTHALMIC | Status: DC
  Administered 2023-11-25 – 2023-11-27 (×4): 1 [drp] via OPHTHALMIC
  Filled 2023-11-25 (×4): qty 30

## 2023-11-25 MED ORDER — DIPHENHYDRAMINE HCL 12.5 MG/5ML PO ELIX: 12.5000 mg | ORAL_SOLUTION | Freq: Four times a day (QID) | ORAL | Status: DC | PRN

## 2023-11-25 MED ORDER — EPHEDRINE 5 MG/ML INJ
INTRAVENOUS | Status: AC
  Filled 2023-11-25: qty 5

## 2023-11-25 MED ORDER — MAGIC MOUTHWASH: 15.0000 mL | Freq: Four times a day (QID) | ORAL | Status: DC | PRN

## 2023-11-25 MED ORDER — PHENYLEPHRINE 80 MCG/ML (10ML) SYRINGE FOR IV PUSH (FOR BLOOD PRESSURE SUPPORT)
PREFILLED_SYRINGE | INTRAVENOUS | Status: AC
  Filled 2023-11-25: qty 10

## 2023-11-25 MED ORDER — GABAPENTIN 100 MG PO CAPS
300.0000 mg | ORAL_CAPSULE | Freq: Two times a day (BID) | ORAL | Status: DC
  Administered 2023-11-25 – 2023-11-27 (×4): 300 mg via ORAL
  Filled 2023-11-25 (×4): qty 3

## 2023-11-25 MED ORDER — ONDANSETRON HCL 4 MG/2ML IJ SOLN
4.0000 mg | Freq: Four times a day (QID) | INTRAMUSCULAR | Status: DC | PRN
Start: 2023-11-25 — End: 2023-11-27
  Administered 2023-11-25: 4 mg via INTRAVENOUS
  Filled 2023-11-25: qty 2

## 2023-11-25 MED ORDER — LACTATED RINGERS IV SOLN: INTRAVENOUS | Status: DC

## 2023-11-25 MED ORDER — CHLORHEXIDINE GLUCONATE 0.12 % MT SOLN
15.0000 mL | Freq: Once | OROMUCOSAL | Status: AC
  Administered 2023-11-25: 15 mL via OROMUCOSAL

## 2023-11-25 MED ORDER — ONDANSETRON 4 MG PO TBDP: 4.0000 mg | ORAL_TABLET | Freq: Four times a day (QID) | ORAL | Status: DC | PRN

## 2023-11-25 MED ORDER — OXYCODONE HCL 5 MG PO TABS
5.0000 mg | ORAL_TABLET | Freq: Once | ORAL | Status: AC | PRN
  Administered 2023-11-25: 5 mg via ORAL

## 2023-11-25 MED ORDER — ONDANSETRON HCL 4 MG/2ML IJ SOLN
INTRAMUSCULAR | Status: DC | PRN
  Administered 2023-11-25: 4 mg via INTRAVENOUS

## 2023-11-25 MED ORDER — PROCHLORPERAZINE EDISYLATE 10 MG/2ML IJ SOLN: 5.0000 mg | Freq: Four times a day (QID) | INTRAMUSCULAR | Status: DC | PRN

## 2023-11-25 MED ORDER — KETAMINE HCL 50 MG/5ML IJ SOSY
PREFILLED_SYRINGE | INTRAMUSCULAR | Status: DC | PRN
  Administered 2023-11-25: 10 mg via INTRAVENOUS
  Administered 2023-11-25: 20 mg via INTRAVENOUS

## 2023-11-25 MED ORDER — DIPHENHYDRAMINE HCL 50 MG/ML IJ SOLN: 12.5000 mg | Freq: Four times a day (QID) | INTRAMUSCULAR | Status: DC | PRN

## 2023-11-25 MED ORDER — LIDOCAINE HCL (CARDIAC) PF 100 MG/5ML IV SOSY
PREFILLED_SYRINGE | INTRAVENOUS | Status: DC | PRN
  Administered 2023-11-25: 80 mg via INTRAVENOUS

## 2023-11-25 MED ORDER — OXYCODONE HCL 5 MG/5ML PO SOLN: 5.0000 mg | Freq: Once | ORAL | Status: AC | PRN

## 2023-11-25 MED ORDER — HYDRALAZINE HCL 20 MG/ML IJ SOLN
INTRAMUSCULAR | Status: DC | PRN
  Administered 2023-11-25 (×2): 2.5 mg via INTRAVENOUS

## 2023-11-25 MED ORDER — ENOXAPARIN SODIUM 40 MG/0.4ML IJ SOSY
40.0000 mg | PREFILLED_SYRINGE | INTRAMUSCULAR | Status: DC
  Administered 2023-11-26: 40 mg via SUBCUTANEOUS
  Filled 2023-11-25 (×2): qty 0.4

## 2023-11-25 MED ORDER — 0.9 % SODIUM CHLORIDE (POUR BTL) OPTIME
TOPICAL | Status: DC | PRN
  Administered 2023-11-25: 1000 mL

## 2023-11-25 MED ORDER — LEVOTHYROXINE SODIUM 75 MCG PO TABS
75.0000 ug | ORAL_TABLET | Freq: Every day | ORAL | Status: DC
  Administered 2023-11-26 – 2023-11-27 (×2): 75 ug via ORAL
  Filled 2023-11-25 (×2): qty 1

## 2023-11-25 MED ORDER — SUGAMMADEX SODIUM 200 MG/2ML IV SOLN
INTRAVENOUS | Status: DC | PRN
Start: 2023-11-25 — End: 2023-11-25
  Administered 2023-11-25: 370 mg via INTRAVENOUS

## 2023-11-25 MED ORDER — FENTANYL CITRATE (PF) 100 MCG/2ML IJ SOLN
INTRAMUSCULAR | Status: DC | PRN
  Administered 2023-11-25 (×6): 50 ug via INTRAVENOUS

## 2023-11-25 MED ORDER — EPHEDRINE SULFATE-NACL 50-0.9 MG/10ML-% IV SOSY
PREFILLED_SYRINGE | INTRAVENOUS | Status: DC | PRN
  Administered 2023-11-25: 5 mg via INTRAVENOUS

## 2023-11-25 MED ORDER — GABAPENTIN 300 MG PO CAPS
300.0000 mg | ORAL_CAPSULE | ORAL | Status: AC
  Administered 2023-11-25: 300 mg via ORAL
  Filled 2023-11-25: qty 1

## 2023-11-25 MED ORDER — OXYCODONE HCL 5 MG PO TABS
5.0000 mg | ORAL_TABLET | ORAL | Status: DC | PRN
  Administered 2023-11-25 (×2): 10 mg via ORAL
  Filled 2023-11-25 (×2): qty 2

## 2023-11-25 SURGICAL SUPPLY — 73 items
APPLICATOR COTTON TIP 6 STRL (MISCELLANEOUS) ×1 IMPLANT
BAG COUNTER SPONGE SURGICOUNT (BAG) ×1 IMPLANT
BLADE SURG SZ11 CARB STEEL (BLADE) ×1 IMPLANT
CHLORAPREP W/TINT 26 (MISCELLANEOUS) ×1 IMPLANT
CLIP APPLIE 5 13 M/L LIGAMAX5 (MISCELLANEOUS) IMPLANT
COVER SURGICAL LIGHT HANDLE (MISCELLANEOUS) ×1 IMPLANT
COVER TIP SHEARS 8 DVNC (MISCELLANEOUS) IMPLANT
DEFOGGER SCOPE WARM SEASHARP (MISCELLANEOUS) ×1 IMPLANT
DRAIN CHANNEL 19F RND (DRAIN) IMPLANT
DRAIN PENROSE 0.5X18 (DRAIN) IMPLANT
DRAPE ARM DVNC X/XI (DISPOSABLE) ×4 IMPLANT
DRAPE COLUMN DVNC XI (DISPOSABLE) ×1 IMPLANT
DRAPE WARM FLUID 44X44 (DRAPES) ×1 IMPLANT
DRIVER NDL LRG 8 DVNC XI (INSTRUMENTS) ×2 IMPLANT
DRIVER NDL MEGA SUTCUT DVNCXI (INSTRUMENTS) ×1 IMPLANT
DRIVER NDLE LRG 8 DVNC XI (INSTRUMENTS) IMPLANT
DRIVER NDLE MEGA SUTCUT DVNCXI (INSTRUMENTS) IMPLANT
DRSG TEGADERM 2-3/8X2-3/4 SM (GAUZE/BANDAGES/DRESSINGS) ×5 IMPLANT
DRSG TEGADERM 4X4.75 (GAUZE/BANDAGES/DRESSINGS) IMPLANT
DRSG TEGADERM 8X12 (GAUZE/BANDAGES/DRESSINGS) IMPLANT
ELECT REM PT RETURN 15FT ADLT (MISCELLANEOUS) ×1 IMPLANT
EVACUATOR DRAINAGE 10X20 100CC (DRAIN) IMPLANT
EVACUATOR SILICONE 100CC (DRAIN) IMPLANT
FORCEPS BPLR FENES DVNC XI (FORCEP) IMPLANT
FORCEPS PROGRASP DVNC XI (FORCEP) ×1 IMPLANT
GAUZE SPONGE 2X2 8PLY STRL LF (GAUZE/BANDAGES/DRESSINGS) ×1 IMPLANT
GLOVE BIO SURGEON STRL SZ 6.5 (GLOVE) IMPLANT
GLOVE BIOGEL PI IND STRL 7.0 (GLOVE) IMPLANT
GLOVE ECLIPSE 8.0 STRL XLNG CF (GLOVE) ×2 IMPLANT
GLOVE INDICATOR 8.0 STRL GRN (GLOVE) ×2 IMPLANT
GOWN STRL REUS W/ TWL XL LVL3 (GOWN DISPOSABLE) ×2 IMPLANT
GRASPER SUT TROCAR 14GX15 (MISCELLANEOUS) IMPLANT
GRASPER TIP-UP FEN DVNC XI (INSTRUMENTS) ×1 IMPLANT
IRRIGATION SUCT STRKRFLW 2 WTP (MISCELLANEOUS) ×1 IMPLANT
KIT BASIN OR (CUSTOM PROCEDURE TRAY) ×1 IMPLANT
KIT PROCEDURE OLYMPUS (MISCELLANEOUS) IMPLANT
KIT TURNOVER KIT A (KITS) ×1 IMPLANT
MARKER SKIN DUAL TIP RULER LAB (MISCELLANEOUS) ×1 IMPLANT
MESH BIO-A 7X10 SYN MAT (Mesh General) IMPLANT
NDL HYPO 22X1.5 SAFETY MO (MISCELLANEOUS) ×1 IMPLANT
NDL INSUFFLATION 14GA 120MM (NEEDLE) ×1 IMPLANT
NEEDLE HYPO 22X1.5 SAFETY MO (MISCELLANEOUS) ×1 IMPLANT
NEEDLE INSUFFLATION 14GA 120MM (NEEDLE) ×1 IMPLANT
PACK CARDIOVASCULAR III (CUSTOM PROCEDURE TRAY) ×1 IMPLANT
PAD POSITIONING PINK XL (MISCELLANEOUS) ×1 IMPLANT
PENCIL SMOKE EVACUATOR (MISCELLANEOUS) IMPLANT
SCISSORS LAP 5X45 EPIX DISP (ENDOMECHANICALS) IMPLANT
SCISSORS MNPLR CVD DVNC XI (INSTRUMENTS) ×1 IMPLANT
SEAL UNIV 5-12 XI (MISCELLANEOUS) ×3 IMPLANT
SEALER VESSEL EXT DVNC XI (MISCELLANEOUS) ×1 IMPLANT
SOLUTION ELECTROSURG ANTI STCK (MISCELLANEOUS) ×1 IMPLANT
SPIKE FLUID TRANSFER (MISCELLANEOUS) ×1 IMPLANT
STOPCOCK 4 WAY LG BORE MALE ST (IV SETS) ×2 IMPLANT
SUT ETHIBOND 0 36 GRN (SUTURE) ×2 IMPLANT
SUT ETHIBOND NAB CT1 #1 30IN (SUTURE) ×3 IMPLANT
SUT MNCRL AB 4-0 PS2 18 (SUTURE) ×1 IMPLANT
SUT PROLENE 2 0 SH DA (SUTURE) IMPLANT
SUT STRATA PDS 2-0 23 CT-1 (SUTURE) IMPLANT
SUT VIC AB 3-0 SH 27XBRD (SUTURE) IMPLANT
SUT VICRYL 0 UR6 27IN ABS (SUTURE) IMPLANT
SUT VLOC 180 0 6IN GS21 (SUTURE) IMPLANT
SUT VLOC 180 2-0 6IN GS21 (SUTURE) IMPLANT
SUT VLOC 180 2-0 9IN GS21 (SUTURE) IMPLANT
SUT VLOC 180 3-0 9IN GS21 (SUTURE) IMPLANT
SUTURE VLOC BRB 180 ABS3/0GR12 (SUTURE) IMPLANT
SYR 10ML LL (SYRINGE) ×1 IMPLANT
SYR 20ML LL LF (SYRINGE) ×1 IMPLANT
TOWEL OR 17X26 10 PK STRL BLUE (TOWEL DISPOSABLE) ×1 IMPLANT
TRAY FOLEY MTR SLVR 14FR STAT (SET/KITS/TRAYS/PACK) IMPLANT
TRAY FOLEY MTR SLVR 16FR STAT (SET/KITS/TRAYS/PACK) IMPLANT
TROCAR ADV FIXATION 5X100MM (TROCAR) ×1 IMPLANT
TUBING ENDO SMARTCAP (MISCELLANEOUS) IMPLANT
TUBING INSUFFLATION 10FT LAP (TUBING) ×1 IMPLANT

## 2023-11-25 NOTE — Op Note (Addendum)
 11/25/2023  11:00 AM  PATIENT:  Stacy Boyd  68 y.o. female  Patient Care Team: Chandra Toribio POUR, MD as PCP - General (Family Medicine) Leni Marjory MATSU, MD (Rheumatology) Mat Browning, MD as Consulting Physician (Obstetrics and Gynecology) Dianna Specking, MD as Consulting Physician (Gastroenterology) Sheldon Standing, MD as Consulting Physician (General Surgery) Stacia Glendia BRAVO, MD as Consulting Physician (Gastroenterology)  PRE-OPERATIVE DIAGNOSIS:  PARAESOPHAGEAL HIATAL HERNIA REFRACTORY TO MEDICAL MANAGEMENT  POST-OPERATIVE DIAGNOSIS:  PARAESOPHAGEAL HIATAL HERNIA REFRACTORY TO MEDICAL MANAGEMENT  PROCEDURE:  Procedure(s): REPAIR, HERNIA, PARAESOPHAGEAL, ROBOT-ASSISTED  1. ROBOTIC reduction of paraesophageal hiatal hernia 2. Type II mediastinal dissection. 2.5 Left crural release 3. Primary repair of hiatal hernia over pledgets.  4. Anterior & posterior gastropexy. 5. Toupet (270 degree partial posterior x 4cm)  fundoplication 6. Mesh reinforcement with absorbable mesh  SURGEON:  Standing KYM Sheldon, MD  ASSISTANT:  Puja Gosai Maczis, PA-C  An experienced assistant was required given the standard of surgical care given the complexity of the case.  This assistant was needed for exposure, dissection, suction, tissue approximation, retraction, perception, etc  ANESTHESIA:  General endotracheal intubation anesthesia (GETA) and Local & regional field block at incision(s) for perioperative & postoperative pain control provided with 60mL of bupivicaine 0.25% with epinephrine  Estimated Blood Loss (EBL):   Total I/O In: 100 [IV Piggyback:100] Out: 50 [Blood:50].   (See anesthesia record)  Delay start of Pharmacological VTE agent (>24hrs) due to concerns of significant anemia, surgical blood loss, or risk of bleeding?:  no  DRAINS: (None) and 19 Fr Blake drain with tip resting in the mediastinum  SPECIMEN:  Hernia sac (not sent)  DISPOSITION OF SPECIMEN:  (not  applicable)  COUNTS:  Sponge, needle, & instrument counts CORRECT at the conclusion of the case.      PLAN OF CARE: Admit to inpatient   PATIENT DISPOSITION:  PACU - hemodynamically stable.  INDICATION:   Patient with symptomatic paraesophageal hiatal hernia.  The patient has had extensive work-up & we feel the patient will benefit from repair:  The anatomy & physiology of the foregut and anti-reflux mechanism was discussed.  The pathophysiology of hiatal herniation and GERD was discussed.  Natural history risks without surgery was discussed.   The patient's symptoms are not adequately controlled by medicines and other non-operative treatments.  I feel the risks of no intervention will lead to serious problems that outweigh the operative risks; therefore, I recommended surgery to reduce the hiatal hernia out of the chest and fundoplication to rebuild the anti-reflux valve and control reflux better.  Need for a thorough workup to rule out the differential diagnosis and plan treatment was explained.  I explained laparoscopic techniques with possible need for an open approach.  Risks such as bleeding, infection, abscess, leak, need for further treatment, heart attack, death, and other risks were discussed.   I noted a good likelihood this will help address the problem.  Goals of post-operative recovery were discussed as well.  Possibility that this will not correct all symptoms was explained.  Post-operative dysphagia, need for short-term liquid & pureed diet, inability to vomit, possibility of reherniation, possible need for medicines to help control symptoms in addition to surgery were discussed.  We will work to minimize complications.   Educational handouts further explaining the pathology, treatment options, and dysphagia diet was given as well.  Questions were answered.  The patient expresses understanding & wishes to proceed with surgery.  OR FINDINGS:   Moderate-sized paraesophageal hiatal  hernia with 70% of the stomach in the mediastinum.  There was a 9 x 7 cm hiatal defect.  Required L crural release.  It is a primary repair over pledgets.   Mesh reinforcement was used with GORE BIO-A mesh, a biosynthetic web scaffold made of 67% polyglycolic acid (PGA): 33% trimethylene carbonate (TMC).  The patient has a Toupet (270 degree partial posterior x 4cm)  fundoplication.  The patient has had anterior and posterior gastropexy.  DESCRIPTION:   Informed consent was confirmed.  The patient received IV antibiotics prior to incision.  The underwent general anesthesia without difficulty.  A Foley catheter sterilely placed.  The patient was positioned in split leg with arms tucked. The abdomen was prepped and draped in the sterile fashion.  Surgical time-out confirmed our plan.  Entry was gained using Varess entry technique at the left subcostal ridge with the patient in steep reverse Trendelenburg and left side up.  Port was carefully placed.  Entry was clean.  We induced carbon dioxide insufflation.  Camera inspection revealed no injury.  Under direct visualization, I placed extra ports.  I also placed a 5 mm port in the left subxiphoid region under direct visualization.  I removed that and placed an Omega-shaped rigid Nathanson liver retractor to lift the left lateral sector of the liver anteriorly to expose the esophageal hiatus.  This was secured to the bed using the iron man system.  The Xi robot was carefully docked and instruments placed and advanced under direct visualization.  We focused on dissection.  Findings as noted above with omentum and much of the stomach flipped and chronically twisted/volvulized up in the stomach.  No definite ischemia.  I grasped the anterior mediastinal sac at the apex of the crus.  I scored through that and got into the anterior mediastinum.  I was able to free the mediastinal sac from its attachments to the pericardium and bilateral pleura using primarily  focused gentle blunt dissection as well as focused vessel sealer dissection.  I transected phrenoesophageal attachments to the inner right crus, preserving a two centimeter cuff of mediastinal sac until I found the base of the crura.  I then came around anteriorly on the left side and freed up the phrenoesophageal attachments of the mediastinal sac on the medial part of the left crus on the superior half.  I did careful mediastinal dissection to free the mediastinal sac.  With that, we could relieve the suction cup affect of the hernia sac and help reduce the stomach back down into the abdomen, flipped back approriately.    We ligated the short gastrics along the lesser curvature of the stomach about a third the way down and then came up proximally over the fundus.  We released the attachments of the stomach to the retroperitoneum until we were able to connect with the prior dissection on the left crus.  We completed the release of phrenoesophageal attachments to the medial part of the left crus down to its base.  With this, we had circumferential mobilization.    We placed the stomach and esophagus on axial tension.  I then did a Type II mediastinal dissection where I freed the esophagus from its attachments to the aorta, spine, pleura, and pericardium using primarily gentle blunt as well as focused ultrasonic dissection.  We saw the anterior & posterior vagus nerves intact.  We preserved it at all times.  I procedded to dissect about 20 cm proximally into the mediastinum.  Got up to  the azygous vein and carefully freed esophagus off that.  With that I could straighten out the esophagus and get 5 cm of intra-abdominal length of the esophagus at a best estimation.  I freed the anterior mediastinal sac off the esophagus & stomach.  We saw the anterior vagus nerve and freed the sac off of the vagus.  I dissected out & removed the fatty epiphrenic pads at the esophagogastric junction.  Freed off the fatty hernia  sacs as well.  This took some time.  Ended up freeing off venting allowing some of the lesser sac fat and proximal hernia sac to come down naturally.  Try to avoid injuring the vagus nerves.  With that, I could better define the esophagogastric junction.  I confirmed the the patient had 5 cm of intra-abdominal esophageal length off tension.  The crura had some tension more on the left side.  I mobilized the spleen and omentum off the left diaphragm and crura.  I ended up doing a left crural release coming through the abdominal side of the diaphragm in the sagittal plane 5 cm from the inner left crus.  With that got relaxation on the left crus and it could medialize 2-3 cm.  Also freed off some of the liver attachments and fibrotic and diaphragmatic attachments on the right anterior crus near the apex.  Held off on any more releasing on the right crural side since things seem to come together better.  I brought the fundus of the stomach posterior to the esophagus over to the right side.  The wrap was mobile with the classic shoe shine maneuver.  Wrap became together gently.  We reflected the stomach left laterally and closed the esophageal hiatus using #1 Ethibond stitch using horizontal mattress stitches with pledgets on both sides.  I did that x2 stitches.  I then did a third #1 Ethibond horizontal mattress suture to close the hiatal hernia at the most superior junction without pledgets.  The crura had good substance and they came together well without any tension.  Because of the larger defect, I reinforced the repair using a Bio-A 10x7 cm biosynthetic precut mesh. We brought the mesh in and laid it over the crural repair, tails anterior over the crura.  I tucked the broader U tail of the mesh between the left diaphragm and the spleen, the narrower U tail over the right crus.  I secured to the left lateral and left superior sides of the broader U tail to the left diaphragm band with 2-0 V lock running  suture.  Secured the narrow towel to the right crus using a running 2-0 V-lock suture as well  I brought the fundus of the stomach behind the esophagus and cardia to set up a fundoplication wrap.  I did a posterior gastropexy x3  by taking of #1 Ethibond interrupted stitches to the posterior part of the right side of the wrap and thru the mesh and crural closure.  I placed a stitch on the inner right crus, superior part of the wrap, superior lateral esophagus and tied that down for a right anterior gastropexy.  I did a similar stitch on the left anterior side as well.  That way the stomach covered the mesh and protected it from any esophageal exposure.  With the anterior and posterior gastropexies, stomach laid well for a fundoplication wrap.  I then did a classic 4cm Toupet fundoplication on the true esophagus above the cardia using 0 Ethibond stitch in the left  superior side of the wrap, apex of the left inner crus then left anterior esophagus and tied that down to do a left anterior gastropexy.  Did a mirror-image stitch on the right side to do a right anterior gastropexy.  I then did 2 more distal pairs of suture between the inner part of the wrap and the anterolateral esophagus.  That way there were 3 total pairs of fundoplication sutures offering a posterior 270 degree wrap.  Measured at 4 cm.  Classic Toupet fundoplication.    The wrap was soft and floppy.  I placed a drain as noted above.  I did irrigation and ensured hemostasis.  I saw no evidence of any leak or perforation or other abnormality.  Sutures and hernia sac and fat pads were carefully removed.  We confirmed all needles were removed and counts were correct.  Placed a 43 French Blake drain up into the mediastinum and exited on the right subcostal 12 mm port site that had been tunneled obliquely through the falciform ligament.  No significant fascial defect.  Robot was undocked.  I removed the [facility] liver retractor under direct  visualization.  I evacuated carbon dioxide and removed the ports.  The skin was closed with Monocryl and sterile dressings applied.  The patient is being extubated and brought back to the recovery room.  I discussed postop care in detail with the patient and family in in the office.  Discussed again with the patient in the holding area.  I discussed operative findings, updated the patient's status, discussed probable steps to recovery, and gave postoperative recommendations to the patient's close friend, Joe J[place]lin.  Recommendations were made.  Questions were answered.  He expressed understanding & appreciation.   Elspeth KYM Schultze, M.D., F.A.C.S. Gastrointestinal and Minimally [facility]vasive [facility] [address], [address] [place], [place] [address] [phone] Main / Paging  11/25/2023

## 2023-11-25 NOTE — H&P (Signed)
 11/25/2023   PATIENT NAME: Shandee Jergens MRN: [id number] DOB: [birth year] PHYSICIANS:  REFERRING PHYSICIAN: Chandra Toribio POUR, MD  CARE TEAM:  Patient Care Team: Chandra Toribio POUR, MD as PCP - General (Family Medicine) Stacia Glendia Lever, MD as Referring Physician (Gastroenterology) Sheldon, Elspeth Bitter, MD as Consulting Provider (General Surgery)  CONSULTING PROVIDER: ELSPETH BITTER SHELDON, MD  SUBJECTIVE   Chief Complaint: New Consultation (HIATAL HERNIA)   Athelene Hursey is a 68 y.o. female  who is seen today as an office consultation  at the request of DrRONITA Stacia, [facility] gastroenterology for evaluation of hiatal hernia  History of Present Illness:  68 year old woman. Followed by [facility] gastroenterology. Has a known hiatal hernia. She has had some heartburn and reflux issues placed on Protonix  in the past. I think they tried to wean down. However she had worsening chest pressure sensation. Worsened with eating. Sometimes with activity and breathing. If she can burp or belch that helps. She tries to avoid eating too quickly. She has had episode of dysphagia where it feels like the food sticks when she eats. She was noted to have some Cameron ulcerations and H. pylori gastritis in 2019 and was treated. Follow-up stool study showed improvement. Had repeat endoscopy by Dr. Stacia which noted a larger hiatal hernia. Some esophagitis. Stomach was biopsy consistent with some gastropathy but no recurrent H. pylori. She is to sing opera. She feels like her voice has gotten weaker. Wonders if it is related to reflux. No severe sore throats or bad hoarseness. She did have a calcium score done by CAT scan 2023 that showed no calcifications which is reassuring. A few small pulmonary nodules that appeared stable on follow-up CAT scan the following year.   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 She has a couple  episodes of sharp chest pain after eating or when she is active or taking deep breaths that concerned her. She had C-section, diagnostic laparoscopy, hysterectomy. No other abdominal surgeries. Has gotten through knee surgeries by Dr. Melodi in 2021 and 2022 without incident.She claims she walks several mile without difficulty. She does not smoke. No diabetes.  Ready for surgery  Medical History:  Past Medical History:  Diagnosis Date  Anxiety  Thyroid  disease   Patient Active Problem List  Diagnosis  Incarcerated hiatal hernia  Esophageal dysphagia   Past Surgical History:  Procedure Laterality Date  Lasix Eye Surgery 1999  COLONOSCOPY W/INJECTION 2016  ARTHROPLASTY TOTAL KNEE Right 11/20/2019  ARTHROPLASTY TOTAL KNEE Left 03/04/2020  ABDOMINAL HYSTERECTOMY  CESAREAN SECTION    Allergies  Allergen Reactions  Influenza Vac Typ A,B Surf Ant Hives  Erythromycin Hives, Nausea and Rash   Current Outpatient Medications on File Prior to Visit  Medication Sig Dispense Refill  CALCIUM CARBONATE-VITAMIN D3 ORAL Take 1 tablet by mouth once daily  cholecalciferol, vitamin D3, (VITAMIN D3) 125 mcg (5,000 unit) tablet Take 5,000 Units by mouth once daily  cycloSPORINE (RESTASIS) 0.05 % ophthalmic emulsion Place 1 drop into both eyes 2 (two) times daily  famotidine -calcium carbonate-magnesium hydroxide (PEPCID  COMPLETE) 10-800-165 mg Chew chewable tablet Take 1 tablet by mouth at bedtime  fexofenadine (ALLEGRA) 180 MG tablet Take 180 mg by mouth once daily  levothyroxine (SYNTHROID) 75 MCG tablet Take 75 mcg by mouth every morning before breakfast  omega-3-dha-epa-fish oil 300-1,000 mg capsule Take by mouth  pantoprazole  (PROTONIX ) 20 MG DR tablet Take by mouth  peg 400-propylene glycol (SYSTANE ULTRA)  0.4-0.3 % drops Apply 1 drop to eye 4 (four) times daily as needed  progesterone  (PROMETRIUM ) 100 MG capsule 100 mg   No current facility-administered medications on file prior to visit.    Family History  Problem Relation Age of Onset  Hearing loss Mother  Diabetes Mother  High blood pressure (Hypertension) Mother  Stroke Mother  Colon cancer Father  Heart disease Father  Cancer Father  Breast cancer Sister  Diabetes Sister  Hyperlipidemia (Elevated cholesterol) Sister  Early death Sister  Cancer Sister  Prostate cancer Brother  Hearing loss Brother  Diabetes Brother  Hyperlipidemia (Elevated cholesterol) Brother  Stomach cancer Neg Hx  Rectal cancer Neg Hx  Pancreatic cancer Neg Hx  Esophageal cancer Neg Hx    Social History   Tobacco Use  Smoking Status Never  Smokeless Tobacco Never    Social History   Socioeconomic History  Marital status: Married  Tobacco Use  Smoking status: Never  Smokeless tobacco: Never  Vaping Use  Vaping status: Never Used  Substance and Sexual Activity  Alcohol use: Yes  Alcohol/week: 0.0 - 1.0 standard drinks of alcohol  Drug use: Never   Social Drivers of Corporate Investment Banker Strain: Low Risk (08/06/2023)  Received from [facility]  Overall Financial Resource Strain (CARDIA)  How hard is it for you to pay for the very basics like food, housing, medical care, and heating?: Not hard at all  Food Insecurity: No Food Insecurity (08/06/2023)  Received from [facility]  Hunger Vital Sign  Within the past 12 months, you worried that your food would run out before you got the money to buy more.: Never true  Within the past 12 months, the food you bought just didn't last and you didn't have money to get more.: Never true  Transportation Needs: No Transportation Needs (08/06/2023)  Received from [facility] - Transportation  In the past 12 months, has lack of transportation kept you from medical appointments or from getting medications?: No  In the past 12 months, has lack of transportation kept you from meetings, work, or from getting things needed for daily living?: No  Physical Activity: Sufficiently  Active (08/06/2023)  Received from [facility]  Exercise Vital Sign  On average, how many days per week do you engage in moderate to strenuous exercise (like a brisk walk)?: 5 days  On average, how many minutes do you engage in exercise at this level?: 150+ min  Stress: No Stress Concern Present (08/06/2023)  Received from [facility] - Occupational Stress Questionnaire  Do you feel stress - tense, restless, nervous, or anxious, or unable to sleep at night because your mind is troubled all the time - these days?: Only a little  Social Connections: Moderately Integrated (08/06/2023)  Received from [facility]  Social Connection and Isolation Panel  In a typical week, how many times do you talk on the phone with family, friends, or neighbors?: Once a week  How often do you get together with friends or relatives?: More than three times a week  How often do you attend church or religious services?: More than 4 times per year  Do you belong to any clubs or organizations such as church groups, unions, fraternal or athletic groups, or school groups?: Yes  How often do you attend meetings of the clubs or organizations you belong to?: More than 4 times per year  Are you married, widowed, divorced, separated, never married,  or living with a partner?: Divorced  Housing Stability: Unknown (09/06/2023)  Housing Stability Vital Sign  Homeless in the Last Year: No   ############################################################  Review of Systems: A complete review of systems (ROS) was obtained from the patient.  We have reviewed this information and discussed as appropriate with the patient.  See HPI as well for other pertinent ROS.  Constitutional: No fevers, chills, sweats. Weight stable Eyes: No vision changes, No discharge HENT: No sore throats, nasal drainage Lymph: No neck swelling, No bruising easily Pulmonary: No cough, productive sputum CV: No  orthopnea, PND . No exertional chest/neck/shoulder/arm pain. Patient can walk 2-3 miles without difficulty.   GI: No personal nor family history of GI/colon cancer, inflammatory bowel disease, irritable bowel syndrome, allergy such as Celiac Sprue, dietary/dairy problems, colitis, ulcers nor gastritis. No recent sick contacts/gastroenteritis. No travel outside the country. No changes in diet.  Renal: No UTIs, No hematuria Genital: No drainage, bleeding, masses Musculoskeletal: No severe joint pain. Good ROM major joints Skin: No sores or lesions Heme/Lymph: No easy bleeding. No swollen lymph nodes Neuro: No active seizures. No facial droop Psych: No hallucinations. No agitation  OBJECTIVE   Vitals:  09/06/23 0951  BP: 135/69  Pulse: 69  Temp: 36.8 C (98.2 F)  TempSrc: Temporal  SpO2: 97%  Weight: 92 kg (202 lb 12.8 oz)  Height: 168.9 cm (5' 6.5)  PainSc: 0-No pain   Body mass index is 32.24 kg/m.  PHYSICAL EXAM:  Constitutional: Not cachectic. Hygeine adequate. Vitals signs as above.  Eyes: Wears glasses - vision corrected,Pupils reactive, normal extraocular movements. Sclera nonicteric Neuro: CN II-XII intact. No major focal sensory defects. No major motor deficits. Lymph: No head/neck/groin lymphadenopathy Psych: No severe agitation. No severe anxiety. Judgment & insight Adequate, Oriented x4, HENT: Normocephalic, Mucus membranes moist. No thrush. Hearing: adequate Neck: Supple, No tracheal deviation. No obvious thyromegaly Chest: No pain to chest wall compression. Good respiratory excursion. No audible wheezing CV: Pulses intact. regular. No major extremity edema Ext: No obvious deformity or contracture. Edema: Not present. No cyanosis Skin: No major subcutaneous nodules. Warm and dry Musculoskeletal: Severe joint rigidity not present. No obvious clubbing. No digital petechiae. Mobility: no assist device moving easily without restrictions  Abdomen: Obese Soft.  Nondistended. Nontender. Hernia: Not present. Diastasis recti: Not present. No hepatomegaly. No splenomegaly.  Genital/Pelvic: Inguinal hernia: Not present. Inguinal lymph nodes: without lymphadenopathy nor hidradenitis.   Rectal: (Deferred)  PE Chaperone note: Roseline Argyle, CMA, was included in the room as chaperone for sensitive portions of the exam   ###################################################################  Labs, Imaging and Diagnostic Testing:  Located in 'Care Everywhere' section of Epic EMR chart  PRIOR [facility] CLINIC NOTES:  Not applicable  SURGERY NOTES:  Not applicable  PATHOLOGY:  Located in 'Care Everywhere' section of Epic EMR chart  Assessment and Plan:  DIAGNOSES:  Diagnoses and all orders for this visit:  Incarcerated hiatal hernia  Esophageal dysphagia    ASSESSMENT/PLAN  Pleasant active woman with a very large hiatal hernia that I think is chronically volvulized causing pain bloating and borborygmi. I think she would benefit from surgery to get her stomach out of her chest and untwisted. Most likely primary closure with absorbable mesh. Partial versus complete fundoplication.  The anatomy & physiology of the foregut and anti-reflux mechanism was discussed. The pathophysiology of hiatal herniation and GERD was discussed. Natural history risks without surgery was discussed. The patient's symptoms are not adequately controlled by medicines and other non-operative treatments. I  feel the risks of no intervention will lead to serious problems that outweigh the operative risks; therefore, I recommended surgery to reduce the hiatal hernia out of the chest and fundoplication to rebuild the anti-reflux valve and control reflux better. Need for a thorough workup to rule out the differential diagnosis and plan treatment was explained. I explained minimally invasive techniques with possible need for an open approach.  Risks such as bleeding, infection, abscess,  leak,injury to other organs, need for repair of tissues / organs, need for further treatment, stroke, heart attack, death, and other risks were discussed. I noted a good likelihood this will help address the problem. Goals of post-operative recovery were discussed as well. Possibility that this will not correct all symptoms was explained. Post-operative dysphagia, need for short-term liquid & pureed diet, inability to vomit, possibility of reherniation, possible need for medicines to help control symptoms in addition to surgery were discussed. We will work to minimize complications. Educational handouts further explaining the pathology, treatment options, and dysphagia diet was given as well. Questions were answered. The patient expresses understanding & wishes to proceed with surgery. 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 I caution she will need time to recover and ask for help especially the first few weeks. She will work on that.  I think it would be wise for her to get esophageal manometry to make sure that her esophagus functions well and there is no evidence of any mechanical dysmotility issues that would contribute. Could not be scheduled until Spring 2026 & she did not want to wait.  Plan partial fundoplication just in case  Elspeth KYM Schultze, MD, FACS, MASCRS Esophageal, Gastrointestinal & Colorectal Surgery Robotic and Minimally Invasive Surgery  [facility] [address] [place], [place] [address] [phone] Fax [phone] Main  CONTACT INFORMATION: Weekday (9AM-5PM): Call [facility] main office at [phone] Weeknight (5PM-9AM) or Weekend/Holiday: Check EPIC Web Links tab & use AMION (password  [email or web address]) for General Surgery [facility] coverage  Please, DO NOT use SecureChat  (it is not reliable communication to reach operating surgeons & will lead to a delay in  care).   Epic staff messaging available for outpatient concerns needing 1-2 business day response.

## 2023-11-25 NOTE — Transfer of Care (Signed)
 Immediate Anesthesia Transfer of Care Note  Patient: Stacy Boyd  Procedure(s) Performed: REPAIR, HERNIA, PARAESOPHAGEAL, ROBOT-ASSISTED  Patient Location: PACU  Anesthesia Type:General  Level of Consciousness: awake, drowsy, and patient cooperative  Airway & Oxygen Therapy: Patient Spontanous Breathing and Patient connected to face mask oxygen  Post-op Assessment: Report given to RN and Post -op Vital signs reviewed and stable  Post vital signs: Reviewed and stable  Last Vitals:  Vitals Value Taken Time  BP 145/100 11/25/23 11:15  Temp    Pulse 80 11/25/23 11:18  Resp 11 11/25/23 11:18  SpO2 100 % 11/25/23 11:18  Vitals shown include unfiled device data.  Last Pain:  Vitals:   11/25/23 0656  TempSrc:   PainSc: 0-No pain         Complications: No notable events documented.

## 2023-11-25 NOTE — Interval H&P Note (Signed)
 History and Physical Interval Note:  11/25/2023 7:21 AM  Stacy Boyd  has presented today for surgery, with the diagnosis of PARAESOPHAGEAL HIATAL HERNIA REFRACTORY TO MEDICAL MANAGEMENT.  The various methods of treatment have been discussed with the patient and family. After consideration of risks, benefits and other options for treatment, the patient has consented to  Procedure(s) with comments: REPAIR, HERNIA, PARAESOPHAGEAL, ROBOT-ASSISTED (N/A) - ROBOTIC REPAIR PARAESOPHAGEAL HIATAL HERNIA WITH FUNDOPLICATION as a surgical intervention.  The patient's history has been reviewed, patient examined, no change in status, stable for surgery.  I have reviewed the patient's chart and labs.  Questions were answered to the patient's satisfaction.    I have re-reviewed the the patient's records, history, medications, and allergies.  I have re-examined the patient.  I again discussed intraoperative plans and goals of post-operative recovery.  The patient agrees to proceed.  Stacy Boyd  12-21-1955 [id number]  Patient Care Team: Chandra Toribio POUR, MD as PCP - General (Family Medicine) Leni Marjory MATSU, MD (Rheumatology) Mat Browning, MD as Consulting Physician (Obstetrics and Gynecology) Dianna Specking, MD as Consulting Physician (Gastroenterology) Sheldon Standing, MD as Consulting Physician (General Surgery) Stacia Glendia BRAVO, MD as Consulting Physician (Gastroenterology)  Patient Active Problem List   Diagnosis Date Noted   Other fatigue 05/31/2023   Hypercholesteremia 10/08/2022   Prediabetes 10/08/2022   Hiatal hernia 10/08/2022   Obesity 04/03/2019   NSAID long-term use 02/16/2017   Rheumatoid arthritis (HCC) 11/24/2016   Low testosterone  level in female 11/24/2016   Family history of colon cancer 08/31/2014   Hypothyroidism 05/07/2010   Osteopenia 05/07/2010   Rosacea 05/07/2010   Sjogren's disease 05/07/2010    Past Medical History:  Diagnosis Date    Allergy 2020   seasonal   Anemia    no longer issue   Arthritis    Cataract 2020   Clotting disorder    see chart   Depression    Currently not being treated   Dry eye    GERD (gastroesophageal reflux disease)    H. pylori infection    History of hiatal hernia    Hypercholesteremia 10/08/2022   Hypothyroid    Pneumonia    Pre-diabetes    Ulcer    see chart    Past Surgical History:  Procedure Laterality Date   ABDOMINAL HYSTERECTOMY     CESAREAN SECTION     COLONOSCOPY  2016   at [facility]   COLONOSCOPY  2023   EYE SURGERY  1999   Lasix   JOINT REPLACEMENT  Nov 2021 & Feb 2022   Right knee Nov, Left knee Feb   REFRACTIVE SURGERY     TOTAL KNEE ARTHROPLASTY Right 11/20/2019   Procedure: TOTAL KNEE ARTHROPLASTY;  Surgeon: Melodi Lerner, MD;  Location: [facility];  Service: Orthopedics;  Laterality: Right;    TOTAL KNEE ARTHROPLASTY Left 03/04/2020   Procedure: TOTAL KNEE ARTHROPLASTY;  Surgeon: Melodi Lerner, MD;  Location: [facility];  Service: Orthopedics;  Laterality: Left;     Social History   Socioeconomic History   Marital status: Single    Spouse name: Not on file   Number of children: Not on file   Years of education: Not on file   Highest education level: Bachelor's degree (e.g., BA, AB, BS)  Occupational History   Occupation: Dance Movement Psychotherapist. Horticulturist, Commercial: PRODUCTION ASSISTANT, [employer] EMPLOYED  Tobacco Use   Smoking status: Never   Smokeless tobacco: Never  Vaping Use  Vaping status: Never Used  Substance and Sexual Activity   Alcohol use: Not Currently    Comment: on occasion   Drug use: Never   Sexual activity: Not Currently    Birth control/protection: Post-menopausal, None  Other Topics Concern   Not on file  Social History Narrative   Not on file   Social Drivers of Health   Financial Resource Strain: Low Risk  (08/06/2023)   Overall Financial Resource Strain (CARDIA)    Difficulty of Paying Living Expenses: Not hard at all  Food  Insecurity: No Food Insecurity (08/06/2023)   Hunger Vital Sign    Worried About Running Out of Food in the Last Year: Never true    Ran Out of Food in the Last Year: Never true  Transportation Needs: No Transportation Needs (08/06/2023)   PRAPARE - Administrator, Civil Service (Medical): No    Lack of Transportation (Non-Medical): No  Physical Activity: Sufficiently Active (08/06/2023)   Exercise Vital Sign    Days of Exercise per Week: 5 days    Minutes of Exercise per Session: 150+ min  Stress: No Stress Concern Present (08/06/2023)   [employer] of Occupational Health - Occupational Stress Questionnaire    Feeling of Stress: Only a little  Social Connections: Moderately Integrated (08/06/2023)   Social Connection and Isolation Panel    Frequency of Communication with Friends and Family: Once a week    Frequency of Social Gatherings with Friends and Family: More than three times a week    Attends Religious Services: More than 4 times per year    Active Member of [employer] or Organizations: Yes    Attends Engineer, Structural: More than 4 times per year    Marital Status: Divorced  Intimate Partner Violence: Not At Risk (02/18/2023)   Humiliation, Afraid, Rape, and Kick questionnaire    Fear of Current or Ex-Partner: No    Emotionally Abused: No    Physically Abused: No    Sexually Abused: No    Family History  Problem Relation Age of Onset   Colon cancer Father 82   Cancer Father    Heart disease Father    Breast cancer Sister    Diabetes Sister    Hyperlipidemia Sister    Prostate cancer Brother    Diabetes Brother    Hearing loss Brother    Hyperlipidemia Brother    Diabetes Mother    Hearing loss Mother    Hypertension Mother    Stroke Mother    Cancer Sister    Early death Sister    Stomach cancer Neg Hx    Rectal cancer Neg Hx    Pancreatic cancer Neg Hx    Esophageal cancer Neg Hx     Medications Prior to Admission  Medication Sig  Dispense Refill Last Dose/Taking   Calcium-Phosphorus-Vitamin D  (CITRACAL +D3 PO) Take 1 tablet by mouth daily.   Past Week   Cholecalciferol (VITAMIN D -3) 125 MCG (5000 UT) TABS Take 5,000 Units by mouth daily.   Past Week   Coenzyme Q10 (COQ10) 100 MG CAPS Take 100 mg by mouth daily.   Past Week   Cyanocobalamin  (B-12) 5000 MCG SUBL Take 5,000 mcg by mouth daily.   Past Week   cycloSPORINE (RESTASIS) 0.05 % ophthalmic emulsion Place 1 drop into both eyes 2 (two) times daily.   11/25/2023 at  4:00 AM   famotidine -calcium carbonate-magnesium hydroxide (PEPCID  COMPLETE) 10-800-165 MG chewable tablet Chew 1 tablet by mouth  at bedtime. Chewable   Past Week   fexofenadine (ALLEGRA) 180 MG tablet Take 180 mg by mouth daily.   11/25/2023 Morning   levothyroxine (SYNTHROID) 75 MCG tablet Take 75 mcg by mouth daily before breakfast.   11/25/2023 Morning   liothyronine (CYTOMEL) 5 MCG tablet Take 10 mcg by mouth daily.   11/25/2023 Morning   Multiple Vitamins-Minerals (CENTRUM SILVER 50+WOMEN PO) Take 1 tablet daily by mouth.   Past Week   Omega-3 Fatty Acids (FISH OIL ULTRA) 1400 MG CAPS Take 1,400 mg by mouth daily.   Past Week   Polyethyl Glycol-Propyl Glycol 0.4-0.3 % SOLN Place 1 drop into both eyes 4 (four) times daily as needed (Dry eyes).   11/24/2023   progesterone  (PROMETRIUM ) 100 MG capsule Take 200 mg by mouth at bedtime.   11/24/2023   TURMERIC PO Take 2,000 mg by mouth daily.   Past Week   pantoprazole  (PROTONIX ) 20 MG tablet Take 2 tablets (40 mg total) by mouth daily for 60 days, THEN 1 tablet (20 mg total) daily for 30 days, THEN 1 tablet (20 mg total) every other day. (Patient not taking: Reported on 08/26/2023) 165 tablet 0     Current Facility-Administered Medications  Medication Dose Route Frequency Provider Last Rate Last Admin   acetaminophen  (TYLENOL ) tablet 1,000 mg  1,000 mg Oral On Call to OR Sheldon Standing, MD       cefTRIAXone (ROCEPHIN) 2 g in sodium chloride  0.9 % 100 mL IVPB   2 g Intravenous On Call to OR Sheldon Standing, MD       Chlorhexidine  Gluconate Cloth 2 % PADS 6 each  6 each Topical Once Sheldon Standing, MD       dexamethasone  (DECADRON ) injection 4 mg  4 mg Intravenous On Call to OR Sheldon Standing, MD       feeding supplement (ENSURE PRE-SURGERY) liquid 592 mL  592 mL Oral Once Sheldon Standing, MD       lactated ringers  infusion   Intravenous Continuous Stoltzfus, Cordella SQUIBB, DO 10 mL/hr at 11/25/23 0701 New Bag at 11/25/23 0701     Allergies  Allergen Reactions   Erythromycin Hives, Nausea Only and Rash    BP (!) 163/78   Pulse 65   Temp 98 F (36.7 C) (Oral)   Resp 16   Ht 5' 6 (1.676 m)   Wt 92.1 kg   SpO2 99%   BMI 32.76 kg/m   Labs: No results found for this or any previous visit (from the past 48 hours).  Imaging / Studies: No results found.   Briant KYM Sheldon, M.D., F.A.C.S. Gastrointestinal and Minimally Invasive Surgery [facility] [address] [place], [place] [address] [phone] Main / Paging  11/25/2023 7:21 AM     Standing JAYSON Sheldon

## 2023-11-25 NOTE — Anesthesia Postprocedure Evaluation (Signed)
 Anesthesia Post Note  Patient: Deniss Wormley  Procedure(s) Performed: REPAIR, HERNIA, PARAESOPHAGEAL, ROBOT-ASSISTED     Patient location during evaluation: PACU Anesthesia Type: General Level of consciousness: awake and alert Pain management: pain level controlled Vital Signs Assessment: post-procedure vital signs reviewed and stable Respiratory status: spontaneous breathing, nonlabored ventilation, respiratory function stable and patient connected to nasal cannula oxygen Cardiovascular status: blood pressure returned to baseline and stable Postop Assessment: no apparent nausea or vomiting Anesthetic complications: no   No notable events documented.  Last Vitals:  Vitals:   11/25/23 1846 11/25/23 1949  BP: 100/61 101/68  Pulse: 84 93  Resp: 16 16  Temp:  (!) 36.4 C  SpO2: 95% 95%    Last Pain:  Vitals:   11/25/23 1949  TempSrc: Oral  PainSc:                  Ayan Yankey L Yashira Offenberger

## 2023-11-25 NOTE — Discharge Instructions (Signed)
 EATING AFTER YOUR ESOPHAGEAL SURGERY (Stomach Fundoplication, Hiatal Hernia repair, Achalasia surgery, etc)  ######################################################################  EAT Start with a pureed / full liquid diet (see below) Gradually transition to a high fiber diet with a fiber supplement over the next month after discharge.    WALK Walk an hour a day.  Control your pain to do that.    CONTROL PAIN Control pain so that you can walk, sleep, tolerate sneezing/coughing, go up/down stairs.  HAVE A BOWEL MOVEMENT DAILY Keep your bowels regular to avoid problems.  OK to try a laxative to override constipation.  OK to use an antidairrheal to slow down diarrhea.  Call if not better after 2 tries  CALL IF YOU HAVE PROBLEMS/CONCERNS Call if you are still struggling despite following these instructions. Call if you have concerns not answered by these instructions  ######################################################################   After your esophageal surgery, expect some sticking with swallowing over the next 1-2 months.    If food sticks when you eat, it is called dysphagia.  This is due to swelling around your esophagus at the wrap & hiatal diaphragm repair.  It will gradually ease off over the next few months.  To help you through this temporary phase, we start you out on a pureed (blenderized) diet.  Your first meal in the hospital was thin liquids.  You should have been given a pureed diet by the time you left the hospital.  We ask patients to stay on a pureed diet for the first 2-3 weeks to avoid anything getting stuck near your recent surgery.  Don't be alarmed if your ability to swallow doesn't progress according to this plan.  Everyone is different and some diets can advance more or less quickly.    It is often helpful to crush your medications or split them as they can sometimes stick, especially the first week or so.   Some BASIC RULES to follow are: Maintain  an upright position whenever eating or drinking. Take small bites - just a teaspoon size bite at a time. Eat slowly.  It may also help to eat only one food at a time. Consider nibbling through smaller, more frequent meals & avoid the urge to eat BIG meals Do not push through feelings of fullness, nausea, or bloatedness Do not mix solid foods and liquids in the same mouthful Try not to wash foods down with large gulps of liquids. Avoid carbonated (bubbly/fizzy) drinks.   Avoid foods that make you feel gassy or bloated.  Start with bland foods first.  Wait on trying greasy, fried, or spicy meals until you are tolerating more bland solids well. Understand that it will be hard to burp and belch at first.  This gradually improves with time.  Expect to be more gassy/flatulent/bloated initially.  Walking will help your body manage it better. Consider using medications for bloating that contain simethicone such as  Maalox or Gas-X  Consider crushing her medications, especially smaller pills.  The ability to swallow pills should get easier after a few weeks Eat in a relaxed atmosphere & minimize distractions. Avoid talking while eating.   Do not use straws. Following each meal, sit in an upright position (90 degree angle) for 60 to 90 minutes.  Going for a short walk can help as well If food does stick, don't panic.  Try to relax and let the food pass on its own.  Sipping WARM LIQUID such as strong hot black tea can also help slide it down.  Be gradual in changes & use common sense:  -If you easily tolerating a certain level of foods, advance to the next level gradually -If you are having trouble swallowing a particular food, then avoid it.   -If food is sticking when you advance your diet, go back to thinner previous diet (the lower LEVEL) for 1-2 days.  LEVEL 1 = PUREED DIET  Do for the first 2 WEEKS AFTER SURGERY  -Foods in this group are pureed or blenderized to a smooth, mashed  potato-like consistency.  -If necessary, the pureed foods can keep their shape with the addition of a thickening agent.   -Meat should be pureed to a smooth, pasty consistency.  Hot broth or gravy may be added to the pureed meat, approximately 1 oz. of liquid per 3 oz. serving of meat. -CAUTION:  If any foods do not puree into a smooth consistency, swallowing will be more difficult.  (For example, nuts or seeds sometimes do not blend well.)  Hot Foods Cold Foods  Pureed scrambled eggs and cheese Pureed cottage cheese  Baby cereals Thickened juices and nectars  Thinned cooked cereals (no lumps) Thickened milk or eggnog  Pureed French toast or pancakes Ensure  Mashed potatoes Ice cream  Pureed parsley, au gratin, scalloped potatoes, candied sweet potatoes Fruit or Italian ice, sherbet  Pureed buttered or alfredo noodles Plain yogurt  Pureed vegetables (no corn or peas) Instant breakfast  Pureed soups and creamed soups Smooth pudding, mousse, custard  Pureed scalloped apples Whipped gelatin  Gravies Sugar, syrup, honey, jelly  Sauces, cheese, tomato, barbecue, white, creamed Cream  Any baby food Creamer  Alcohol in moderation (not beer or champagne) Margarine  Coffee or tea Mayonnaise   Ketchup, mustard   Apple sauce   SAMPLE MENU:  PUREED DIET Breakfast Lunch Dinner  Orange juice, 1/2 cup Cream of wheat, 1/2 cup Pineapple juice, 1/2 cup Pureed turkey, barley soup, 3/4 cup Pureed Hawaiian chicken, 3 oz  Scrambled eggs, mashed or blended with cheese, 1/2 cup Tea or coffee, 1 cup  Whole milk, 1 cup  Non-dairy creamer, 2 Tbsp. Mashed potatoes, 1/2 cup Pureed cooled broccoli, 1/2 cup Apple sauce, 1/2 cup Coffee or tea Mashed potatoes, 1/2 cup Pureed spinach, 1/2 cup Frozen yogurt, 1/2 cup Tea or coffee      LEVEL 2 = SOFT DIET  After your first 2 weeks, you can advance to a soft diet.   Keep on this diet until everything goes down easily.  Hot Foods Cold Foods  White fish  Cottage cheese  Stuffed fish Junior baby fruit  Baby food meals Semi thickened juices  Minced soft cooked, scrambled, poached eggs nectars  Souffle & omelets Ripe mashed bananas  Cooked cereals Canned fruit, pineapple sauce, milk  potatoes Milkshake  Buttered or Alfredo noodles Custard  Cooked cooled vegetable Puddings, including tapioca  Sherbet Yogurt  Vegetable soup or alphabet soup Fruit ice, Italian ice  Gravies Whipped gelatin  Sugar, syrup, honey, jelly Junior baby desserts  Sauces:  Cheese, creamed, barbecue, tomato, white Cream  Coffee or tea Margarine   SAMPLE MENU:  LEVEL 2 Breakfast Lunch Dinner  Orange juice, 1/2 cup Oatmeal, 1/2 cup Scrambled eggs with cheese, 1/2 cup Decaffeinated tea, 1 cup Whole milk, 1 cup Non-dairy creamer, 2 Tbsp Pineapple juice, 1/2 cup Minced beef, 3 oz Gravy, 2 Tbsp Mashed potatoes, 1/2 cup Minced fresh broccoli, 1/2 cup Applesauce, 1/2 cup Coffee, 1 cup Turkey, barley soup, 3/4 cup Minced Hawaiian chicken, 3 oz  Mashed potatoes, 1/2 cup Cooked spinach, 1/2 cup Frozen yogurt, 1/2 cup Non-dairy creamer, 2 Tbsp      LEVEL 3 = CHOPPED DIET  -After all the foods in level 2 (soft diet) are passing through well you should advance up to more chopped foods.  -It is still important to cut these foods into small pieces and eat slowly.  Hot Foods Cold Foods  Poultry Cottage cheese  Chopped Swedish meatballs Yogurt  Meat salads (ground or flaked meat) Milk  Flaked fish (tuna) Milkshakes  Poached or scrambled eggs Soft, cold, dry cereal  Souffles and omelets Fruit juices or nectars  Cooked cereals Chopped canned fruit  Chopped French toast or pancakes Canned fruit cocktail  Noodles or pasta (no rice) Pudding, mousse, custard  Cooked vegetables (no frozen peas, corn, or mixed vegetables) Green salad  Canned small sweet peas Ice cream  Creamed soup or vegetable soup Fruit ice, Italian ice  Pureed vegetable soup or alphabet soup  Non-dairy creamer  Ground scalloped apples Margarine  Gravies Mayonnaise  Sauces:  Cheese, creamed, barbecue, tomato, white Ketchup  Coffee or tea Mustard   SAMPLE MENU:  LEVEL 3 Breakfast Lunch Dinner  Orange juice, 1/2 cup Oatmeal, 1/2 cup Scrambled eggs with cheese, 1/2 cup Decaffeinated tea, 1 cup Whole milk, 1 cup Non-dairy creamer, 2 Tbsp Ketchup, 1 Tbsp Margarine, 1 tsp Salt, 1/4 tsp Sugar, 2 tsp Pineapple juice, 1/2 cup Ground beef, 3 oz Gravy, 2 Tbsp Mashed potatoes, 1/2 cup Cooked spinach, 1/2 cup Applesauce, 1/2 cup Decaffeinated coffee Whole milk Non-dairy creamer, 2 Tbsp Margarine, 1 tsp Salt, 1/4 tsp Pureed turkey, barley soup, 3/4 cup Barbecue chicken, 3 oz Mashed potatoes, 1/2 cup Ground fresh broccoli, 1/2 cup Frozen yogurt, 1/2 cup Decaffeinated tea, 1 cup Non-dairy creamer, 2 Tbsp Margarine, 1 tsp Salt, 1/4 tsp Sugar, 1 tsp    LEVEL 4:  REGULAR FOODS  -Foods in this group are soft, moist, regularly textured foods.   -This level includes meat and breads, which tend to be the hardest things to swallow.   -Eat very slowly, chew well and continue to avoid carbonated drinks. -most people are at this level in 4-6 weeks  Hot Foods Cold Foods  Baked fish or skinned Soft cheeses - cottage cheese  Souffles and omelets Cream cheese  Eggs Yogurt  Stuffed shells Milk  Spaghetti with meat sauce Milkshakes  Cooked cereal Cold dry cereals (no nuts, dried fruit, coconut)  French toast or pancakes Crackers  Buttered toast Fruit juices or nectars  Noodles or pasta (no rice) Canned fruit  Potatoes (all types) Ripe bananas  Soft, cooked vegetables (no corn, lima, or baked beans) Peeled, ripe, fresh fruit  Creamed soups or vegetable soup Cakes (no nuts, dried fruit, coconut)  Canned chicken noodle soup Plain doughnuts  Gravies Ice cream  Bacon dressing Pudding, mousse, custard  Sauces:  Cheese, creamed, barbecue, tomato, white Fruit ice, Italian ice, sherbet   Decaffeinated tea or coffee Whipped gelatin  Pork chops Regular gelatin   Canned fruited gelatin molds   Sugar, syrup, honey, jam, jelly   Cream   Non-dairy   Margarine   Oil   Mayonnaise   Ketchup   Mustard   TROUBLESHOOTING IRREGULAR BOWELS  1) Avoid extremes of bowel movements (no bad constipation/diarrhea)  2) Miralax  17gm mixed in 8oz. water  or juice-daily. May use BID as needed.  3) Gas-x,Phazyme, etc. as needed for gas & bloating.  4) Soft,bland diet. No spicy,greasy,fried foods.  5) Prilosec over-the-counter  as needed  6) May hold gluten/wheat products from diet to see if symptoms improve.  7) May try probiotics (Align, Activa, etc) to help calm the bowels down  7) If symptoms become worse call back immediately.    If you have any questions please call our office at [facility]: [phone].    ################################################################  LAPAROSCOPIC SURGERY: POST OP INSTRUCTIONS  ######################################################################  EAT Gradually transition to a high fiber diet with a fiber supplement over the next few weeks after discharge.   Start with a pureed / full liquid diet (see ESOPHAGEAL SURGERY info above)  WALK Walk an hour a day.  Control your pain to do that.    CONTROL PAIN Control pain so that you can walk, sleep, tolerate sneezing/coughing, go up/down stairs.  HAVE A BOWEL MOVEMENT DAILY Keep your bowels regular to avoid problems.  OK to try a laxative to override constipation.  OK to use an antidairrheal to slow down diarrhea.  Call if not better after 2 tries  CALL IF YOU HAVE PROBLEMS/CONCERNS Call if you are still struggling despite following these instructions. Call if you have concerns not answered by these instructions  ######################################################################    DIET: Follow a light bland diet & liquids the first 24 hours after arrival home,  such as soup, liquids, starches, etc.  Be sure to drink plenty of fluids.  Quickly advance to a usual solid diet within a few days.  Avoid fast food or heavy meals as your are more likely to get nauseated or have irregular bowels.  A low-fat, high-fiber diet for the rest of your life is ideal.  Take your usually prescribed home medications unless otherwise directed. Blood thinners:  You can restart any strong blood thinners after the second postoperative day  for example: COUMADIN (warfarin), XERELTO (rivaroxaban), ELIQUIS (apixaban), PLAVIX (clopidigrel), BRILINTA (ticagrelor), EFFIENT (prasugrel), PRADAXA (dabigatran), etc  Continue aspirin  before & after surgery..     Some oozing/bleeding the first 1-2 weeks is common but should taper down & be small volume.    If you are passing many large clots or having uncontrolling bleeding, call your surgeon  PAIN CONTROL: Pain is best controlled by a usual combination of three different methods TOGETHER: Ice/Heat Over the counter pain medication Prescription pain medication Most patients will experience some swelling and bruising around the incisions.  Ice packs or heating pads (30-60 minutes up to 6 times a day) will help. Use ice for the first few days to help decrease swelling and bruising, then switch to heat to help relax tight/sore spots and speed recovery.  Some people prefer to use ice alone, heat alone, alternating between ice & heat.  Experiment to what works for you.  Swelling and bruising can take several weeks to resolve.   It is helpful to take an over-the-counter pain medication regularly for the first few weeks.  Choose one of the following that works best for you: Naproxen (Aleve, etc)  Two 220mg  tabs twice a day Ibuprofen (Advil, etc) Three 200mg  tabs four times a day (every meal & bedtime) Acetaminophen  (Tylenol , etc) 500-650mg  four times a day (every meal & bedtime) A  prescription for pain medication (such as oxycodone ,  hydrocodone, tramadol , gabapentin , methocarbamol , etc) should be given to you upon discharge.  Take your pain medication as prescribed.  If you are having problems/concerns with the prescription medicine (does not control pain, nausea, vomiting, rash, itching, etc), please call us  [phone] to see if we need to switch you  to a different pain medicine that will work better for you and/or control your side effect better. If you need a refill on your pain medication, please give us  48 hour notice.  contact your pharmacy.  They will contact our office to request authorization. Prescriptions will not be filled after 5 pm or on week-ends  AVOID GETTING CONSTIPATED.   a.  Between the surgery and the pain medications, it is common to experience some constipation.  b.  Drink plenty of liquids c   ake a fiber supplement 2 times day (such as Metamucil, Citrucel, FiberCon, MiraLax , etc) to have a bowel movement every day. d.  If you have not had a BM by 2 days after surgery: -drink liquids only until you have a bowel movement - take MiraLAX  2 doses every 2 hours until you have a bowel movement   Watch out for diarrhea.   If you have many loose bowel movements, simplify your diet to bland foods & liquids for a few days.   Stop any stool softeners and decrease your fiber supplement.   Switching to mild anti-diarrheal medications (Kayopectate, Pepto Bismol) can help.   If this worsens or does not improve, please call us .  Wash / shower every day.  You may shower over the dressings as they are waterproof.  Continue to shower over incision(s) after the dressing is off.  It is good for closed incisions and even open wounds to be washed every day.  Shower every day.  Short baths are fine.  Wash the incisions and wounds clean with soap & water .    You may leave closed incisions open to air if it is dry.   You may cover the incision with clean gauze & replace it after your daily shower for comfort.  TEGADERM:   You have clear gauze band-aid dressings over your closed incision(s).  Remove the dressings 2 days after surgery = 11/15.    ACTIVITIES as tolerated:   You may resume regular (light) daily activities beginning the next day--such as daily self-care, walking, climbing stairs--gradually increasing activities as tolerated.  If you can walk 30 minutes without difficulty, it is safe to try more intense activity such as jogging, treadmill, bicycling, low-impact aerobics, swimming, etc. Save the most intensive and strenuous activity for last such as sit-ups, heavy lifting, contact sports, etc  Refrain from any heavy lifting or straining until you are off narcotics for pain control.   DO NOT PUSH THROUGH PAIN.  Let pain be your guide: If it hurts to do something, don't do it.  Pain is your body warning you to avoid that activity for another week until the pain goes down. You may drive when you are no longer taking prescription pain medication, you can comfortably wear a seatbelt, and you can safely maneuver your car and apply brakes. You may have sexual intercourse when it is comfortable.  FOLLOW UP in our office Please call [facility] at [phone] to set up an appointment to see your surgeon in the office for a follow-up appointment approximately 2-3 weeks after your surgery. Make sure that you call for this appointment the day you arrive home to insure a convenient appointment time.  10. IF YOU HAVE DISABILITY OR FAMILY LEAVE FORMS, BRING THEM TO THE OFFICE FOR PROCESSING.  DO NOT GIVE THEM TO YOUR DOCTOR.   WHEN TO CALL US  ([phone]) [phone]: Poor pain control Reactions / problems with new medications (rash/itching, nausea, etc)  Fever over 101.5 F (38.5  C) Inability to urinate Nausea and/or vomiting Worsening swelling or bruising Continued bleeding from incision. Increased pain, redness, or drainage from the incision   The clinic staff is available to answer your questions during regular business  hours (8:30am-5pm).  Please don't hesitate to call and ask to speak to one of our nurses for clinical concerns.   If you have a medical emergency, go to the nearest emergency room or call 911.  A surgeon from [facility] is always on call at the [facility], [place] [address], [place], [place]  [address] ? MAIN: [phone] ? TOLL FREE: [phone] ?  FAX [phone] [email or web address]  ##############################################################

## 2023-11-25 NOTE — Anesthesia Procedure Notes (Signed)
 Procedure Name: Intubation Date/Time: 11/25/2023 7:55 AM  Performed by: Metta Andrea NOVAK, CRNAPre-anesthesia Checklist: Patient identified, Emergency Drugs available, Suction available, Patient being monitored and Timeout performed Patient Re-evaluated:Patient Re-evaluated prior to induction Oxygen Delivery Method: Circle system utilized Preoxygenation: Pre-oxygenation with 100% oxygen Induction Type: IV induction Ventilation: Mask ventilation without difficulty and Oral airway inserted - appropriate to patient size Laryngoscope Size: Glidescope and 3 Grade View: Grade I Tube type: Oral Tube size: 7.0 mm Number of attempts: 1 Airway Equipment and Method: Stylet Placement Confirmation: ETT inserted through vocal cords under direct vision, positive ETCO2 and breath sounds checked- equal and bilateral Secured at: 21 cm Tube secured with: Tape Dental Injury: Teeth and Oropharynx as per pre-operative assessment and Injury to lip  Difficulty Due To: Difficulty was anticipated Comments: DVL x 1 by Fairy Aly, RN ([facility]) with supervision by Dr Niels. Difficulty anticipated due to anterior larynx. Easy mask with oral airway. Small upper right lip lac.

## 2023-11-26 ENCOUNTER — Inpatient Hospital Stay (HOSPITAL_COMMUNITY)

## 2023-11-26 ENCOUNTER — Encounter (HOSPITAL_COMMUNITY): Payer: Self-pay | Admitting: Surgery

## 2023-11-26 MED ORDER — IOHEXOL 300 MG/ML  SOLN
100.0000 mL | Freq: Once | INTRAMUSCULAR | Status: AC | PRN
  Administered 2023-11-26: 100 mL via ORAL

## 2023-11-26 MED ORDER — DEXAMETHASONE SOD PHOSPHATE PF 10 MG/ML IJ SOLN
8.0000 mg | Freq: Two times a day (BID) | INTRAMUSCULAR | Status: DC
  Administered 2023-11-26 – 2023-11-27 (×3): 8 mg via INTRAVENOUS

## 2023-11-26 NOTE — Progress Notes (Signed)
 11/26/2023  Stacy Boyd [id number] 1955/03/01  CARE TEAM: PCP: Chandra Toribio POUR, MD  Outpatient Care Team: Patient Care Team: Chandra Toribio POUR, MD as PCP - General (Family Medicine) Leni Marjory MATSU, MD (Rheumatology) Mat Browning, MD as Consulting Physician (Obstetrics and Gynecology) Sheldon Standing, MD as Consulting Physician (General Surgery) Stacia Glendia BRAVO, MD as Consulting Physician (Gastroenterology)  Inpatient Treatment Team: Treatment Team:  Sheldon Standing, MD Leron Riis, [place] Cesario Mylinda KIDD, RN   Problem List:   Principal Problem:   Incarcerated hiatal hernia   11/25/2023  PROCEDURE:  Procedure(s): REPAIR, HERNIA, PARAESOPHAGEAL, ROBOT-ASSISTED   1. ROBOTIC reduction of paraesophageal hiatal hernia 2. Type II mediastinal dissection. 2.5 Left crural release 3. Primary repair of hiatal hernia over pledgets.  4. Anterior & posterior gastropexy. 5. Toupet (270 degree partial posterior x 4cm)  fundoplication 6. Mesh reinforcement with absorbable mesh   SURGEON:  Standing KYM Sheldon, MD  OR FINDINGS:    Moderate-sized paraesophageal hiatal hernia with 70% of the stomach in the mediastinum.  There was a 9 x 7 cm hiatal defect.  Required L crural release.  It is a primary repair over pledgets.    Mesh reinforcement was used with GORE BIO-A mesh, a biosynthetic web scaffold made of 67% polyglycolic acid (PGA): 33% trimethylene carbonate (TMC).   The patient has a Toupet (270 degree partial posterior x 4cm)  fundoplication.  The patient has had anterior and posterior gastropexy.    Assessment [facility] Stay = 1 days) 1 Day Post-Op    Relatively stable    Plan:  Some mild orthostasis but seems asymptomatic now.  Normally would keep on the dry side from a cardiopulmonary standpoint.  Medlock fluids with PRN (as needed) boluses.  I&O cath x 1 for some urgent urinary tension.  If recurs, placed Foley for 72 hours and voiding trial  later.  Upper GI esophagram this morning.  If no leak/obstruction advance to dysphagia 1 pured diet.  Surgical drain in mediastinum.  Most likely can remove at time of discharge as long as there is no surprises.  Possibly home tomorrow if she is no longer orthostatic and tolerating p.o. -monitor electrolytes & replace as needed  Keep K>4, Mg>2, Phos>3  -VTE prophylaxis- SCDs.  Anticoagulation prophyllaxis SQ as appropriate  -mobilize as tolerated to help recovery.  Enlist therapies in moderate/high risk patients as appropriate  I updated the patient's status to the patient and nurse  Recommendations were made.  Questions were answered.  They expressed understanding & appreciation.  -Disposition:  Disposition:  The patient is from: Home Anticipate discharge to:  Home Anticipated Date of Discharge is:  November 15,2025   Barriers to discharge:  Pending Clinical improvement (more likely than not) and Need for inpatient procedure/study  Patient currently is NOT MEDICALLY STABLE for discharge from the hospital from a surgery standpoint.      I reviewed nursing notes, last 24 h vitals and pain scores, last 48 h intake and output, last 24 h labs and trends, and last 24 h imaging results.  I have reviewed this patient's available data, including medical history, events of note, test results, etc as part of my evaluation.   A significant portion of that time was spent in counseling. Care during the described time interval was provided by me.  This care required moderate level of medical decision making.  11/26/2023    Subjective: (Chief complaint)  Felt lightheaded getting up.  IV fluids bolus given.  Not consistently urinating  well since surgery.  I&O cath done.  Patient notes minimal chest pressure with deep breaths.  No severe sharp pain.  Tolerating liquids without nausea or vomiting.  Objective:  Vital signs:  Vitals:   11/25/23 2237 11/25/23 2304 11/25/23 2340  11/26/23 0328  BP: (!) 73/48 112/62 136/72 119/66  Pulse: 88 80 78 86  Resp:  17 16 16   Temp:   98 F (36.7 C) 98.5 F (36.9 C)  TempSrc:   Oral Oral  SpO2:  92% 95% 93%  Weight:      Height:        Last BM Date : 11/24/23  Intake/Output   Yesterday:  11/13 0701 - 11/14 0700 In: 3338.7 [P.O.:300; I.V.:1938.7; IV Piggyback:1100] Out: 215 [Urine:100; Drains:65; Blood:50] This shift:  Total I/O In: 2021.7 [P.O.:300; I.V.:721.7; IV Piggyback:1000] Out: 165 [Urine:100; Drains:65]  Bowel function:  Flatus: No  BM:  No  Drain: Serosanguinous   Physical Exam:  General: Pt awake/alert in no acute distress Eyes: PERRL, normal EOM.  Sclera clear.  No icterus Neuro: CN II-XII intact w/o focal sensory/motor deficits. Lymph: No head/neck/groin lymphadenopathy Psych:  No delerium/psychosis/paranoia.  Oriented x 4 HENT: Normocephalic, Mucus membranes moist.  No thrush Neck: Supple, No tracheal deviation.  No obvious thyromegaly Chest: No pain to chest wall compression.  Good respiratory excursion.  No audible wheezing CV:  Pulses intact.  Regular rhythm.  No major extremity edema MS: Normal AROM mjr joints.  No obvious deformity  Abdomen: Soft.  Nondistended.  Mildly tender at incisions only.  No evidence of peritonitis.  No incarcerated hernias.  Ext:  No deformity.  No mjr edema.  No cyanosis Skin: No petechiae / purpurea.  No major sores.  Warm and dry    Results:   Cultures: No results found for this or any previous visit (from the past 720 hours).  Labs: No results found for this or any previous visit (from the past 48 hours).  Imaging / Studies: No results found.  Medications / Allergies: per chart  Antibiotics: Anti-infectives (From admission, onward)    Start     Dose/Rate Route Frequency Ordered Stop   11/25/23 0615  cefTRIAXone (ROCEPHIN) 2 g in sodium chloride  0.9 % 100 mL IVPB        2 g 200 mL/hr over 30 Minutes Intravenous On call to O.R. 11/25/23  [id number] 11/25/23 0820         Note: Portions of this report may have been transcribed using voice recognition software. Every effort was made to ensure accuracy; however, inadvertent computerized transcription errors may be present.   Any transcriptional errors that result from this process are unintentional.    Elspeth KYM Schultze, MD, FACS, MASCRS Esophageal, Gastrointestinal & Colorectal Surgery Robotic and Minimally Invasive Surgery  [facility] [address], [address] [place], [place] [address] [phone] Fax [phone] Main  CONTACT INFORMATION: Weekday (9AM-5PM): Call [facility] main office at [phone] Weeknight (5PM-9AM) or Weekend/Holiday: Check EPIC Web Links tab & use AMION (password  [email or web address]) for General Surgery [facility] coverage  Please, DO NOT use SecureChat  (it is not reliable communication to reach operating surgeons & will lead to a delay in care).   Epic staff messaging available for outpatient concerns needing 1-2 business day response.      11/26/2023  6:36 AM

## 2023-11-26 NOTE — Plan of Care (Signed)

## 2023-11-26 NOTE — Evaluation (Signed)
 Physical Therapy Evaluation Patient Details Name: Stacy Boyd MRN: [id number] DOB: 1955-06-13 Today's Date: 11/26/2023  History of Present Illness  Pt is 68 yo female admitted on 11/25/23 for paraesophageal hernia repair.  Pt with hx including but not limited to hiatoal hernia, RA, osteopenia, bil TKA  Clinical Impression  Pt admitted with above diagnosis. At baseline, pt is independent.  She has support at home, 1-level home, and RW if needed.  Today, pt able to transfer and ambulate 300' at supervision level with RW.  She was did need O2 in order to maintain O2 sats with activity and at rest.  Encouraged her to continue to use incentive spirometer every hour and be OOB ambulating with staff.  Educated on transfer techniques, precautions, safety, importance of mobility, and positions of comfort after abdominal sx - pt verbalized understanding.  No further PT needs , pt expected to progress without skilled therapy services.  Recommend ongoing mobility with medical staff.            If plan is discharge home, recommend the following: Assistance with cooking/housework;Help with stairs or ramp for entrance   Can travel by private vehicle        Equipment Recommendations None recommended by PT  Recommendations for Other Services       Functional Status Assessment Patient has not had a recent decline in their functional status     Precautions / Restrictions Precautions Precautions: Other (comment) Precaution/Restrictions Comments: JP drain      Mobility  Bed Mobility Overal bed mobility: Needs Assistance Bed Mobility: Supine to Sit     Supine to sit: HOB elevated, Supervision          Transfers Overall transfer level: Needs assistance Equipment used: Rolling walker (2 wheels) Transfers: Sit to/from Stand Sit to Stand: Supervision                Ambulation/Gait Ambulation/Gait assistance: Supervision Gait Distance (Feet): 300 Feet Assistive device:  Rolling walker (2 wheels) Gait Pattern/deviations: Step-through pattern       General Gait Details: decreased but functional speed, steady gait, cues for breathing  Stairs            Wheelchair Mobility     Tilt Bed    Modified Rankin (Stroke Patients Only)       Balance Overall balance assessment: Needs assistance Sitting-balance support: No upper extremity supported Sitting balance-Leahy Scale: Good     Standing balance support: No upper extremity supported, Bilateral upper extremity supported Standing balance-Leahy Scale: Good Standing balance comment: RW to ambulate for comfort but could take steps wtihout UE support                             Pertinent Vitals/Pain Pain Assessment Pain Assessment: 0-10 Pain Score: 3  Pain Location: abdomen Pain Descriptors / Indicators: Discomfort Pain Intervention(s): Limited activity within patient's tolerance, Monitored during session, Premedicated before session, Repositioned    Home Living Family/patient expects to be discharged to:: Private residence Living Arrangements: Alone Available Help at Discharge: Family;Friend(s);Available PRN/intermittently Type of Home: House Home Access: Stairs to enter Entrance Stairs-Rails: None Entrance Stairs-Number of Steps: 2   Home Layout: Laundry or work area in basement;Able to live on main level with bedroom/bathroom Home Equipment: Agricultural [employer] (2 wheels);Other (comment) (hiking poles)      Prior Function Prior Level of Function : Independent/Modified Independent;Driving  Mobility Comments: Could ambulate in community without AD; likes to walk and hike       Extremity/Trunk Assessment   Upper Extremity Assessment Upper Extremity Assessment: Overall WFL for tasks assessed    Lower Extremity Assessment Lower Extremity Assessment: Overall WFL for tasks assessed    Cervical / Trunk Assessment Cervical / Trunk Assessment: Normal   Communication        Cognition Arousal: Alert Behavior During Therapy: WFL for tasks assessed/performed   PT - Cognitive impairments: No apparent impairments                                 Cueing       General Comments General comments (skin integrity, edema, etc.): Pt on 1.5 L O2 at arrival with sats 90%.  Tried 1 L to ambulate but down to 84%.  Increased to 2 L with sats 94% with ambulation.  Left on 2 L notified RN    Exercises  Pt did have some c/o back/shoulder soreness under R scapula - had pt perform scapular retraction x 10 and reports feeling some better.    Assessment/Plan    PT Assessment Patient does not need any further PT services  PT Problem List         PT Treatment Interventions      PT Goals (Current goals can be found in the Care Plan section)  Acute Rehab PT Goals Patient Stated Goal: return home PT Goal Formulation: All assessment and education complete, DC therapy    Frequency       Co-evaluation               AM-PAC PT 6 Clicks Mobility  Outcome Measure Help needed turning from your back to your side while in a flat bed without using bedrails?: None Help needed moving from lying on your back to sitting on the side of a flat bed without using bedrails?: None Help needed moving to and from a bed to a chair (including a wheelchair)?: None Help needed standing up from a chair using your arms (e.g., wheelchair or bedside chair)?: None Help needed to walk in hospital room?: A Little Help needed climbing 3-5 steps with a railing? : A Little 6 Click Score: 22    End of Session Equipment Utilized During Treatment: Gait belt;Oxygen Activity Tolerance: Patient tolerated treatment well Patient left: in chair;with call bell/phone within reach Nurse Communication: Mobility status PT Visit Diagnosis: Other abnormalities of gait and mobility (R26.89)    Time: 8460-8396 PT Time Calculation (min) (ACUTE ONLY): 24 min   Charges:    PT Evaluation $PT Eval Low Complexity: 1 Low PT Treatments $Gait Training: 8-22 mins PT General Charges $$ ACUTE PT VISIT: 1 Visit         Benjiman, PT Acute Rehab Services [facility] [phone]   Benjiman VEAR Mulberry 11/26/2023, 4:19 PM

## 2023-11-26 NOTE — Progress Notes (Signed)
 Pt was assisted to [facility] to void. Pt stated she felt dizzy. NT checked BP, as 1st shift nurse reported that the pt had been dizzy with getting OOB as well and BP had dropped. Current BP is 73/48. UOP was . Pt was assisted back to bed without any complications. She was told that we were not going to be getting her back out of bed due to her BP lowering when she gets up and OOB. She stated understanding. LR bolus started as there is a standing order for this based on BP and UOP. Purewick placed, for now, to assist with urination. Placed call to rapid response nurse- Mandy,to make them aware of events. Will continue to monitor BP and UOP.

## 2023-11-27 NOTE — Progress Notes (Signed)
 Reviewed written d/c instructions w pt and all questions answered. She verbalized understanding. D/C via w/c w all belongings in stable condition.

## 2023-11-27 NOTE — Plan of Care (Signed)

## 2023-11-27 NOTE — Discharge Summary (Signed)
 Physician Discharge Summary  Patient ID: Stacy Boyd MRN: [id number] DOB/AGE: 01/12/1956 68 y.o.  Admit date: 11/25/2023 Discharge date: 11/27/2023  Admission Diagnoses: Principal Problem:   Incarcerated hiatal hernia Discharge Diagnoses:  Principal Problem:   Incarcerated hiatal hernia   Discharged Condition: good  Hospital Course: Pt admitted after Robotic PEH repeair  Consults: None  Significant Diagnostic Studies: labs: cbc, bmet, UGI: no leak   Treatments: surgery: see above  Discharge Exam: Blood pressure (!) 121/58, pulse 75, temperature 98.5 F (36.9 C), resp. rate 17, height 5' 6 (1.676 m), weight 92.1 kg, SpO2 92%. General appearance: alert and cooperative GI: soft Incision/Wound: clean, dry, intact  Disposition: home  Discharge Instructions     Call MD for:   Complete by: As directed    Temperature > 101.5F   Call MD for:  extreme fatigue   Complete by: As directed    Call MD for:  hives   Complete by: As directed    Call MD for:  persistant nausea and vomiting   Complete by: As directed    Call MD for:  redness, tenderness, or signs of infection (pain, swelling, redness, odor or green/yellow discharge around incision site)   Complete by: As directed    Call MD for:  severe uncontrolled pain   Complete by: As directed    Diet general   Complete by: As directed    SEE ESOPHAGEAL SURGERY DIET INSTRUCTIONS  We using usually start you out on a pureed (blenderized) diet. Expect some sticking with swallowing over the next 1-2 months.   This is due to swelling around your esophagus at the wrap & hiatal diaphragm repair.  It will gradually ease off over the next few months.   Discharge instructions   Complete by: As directed    Please see discharge instruction sheets.   Also refer to any handouts/printouts that may have been given from the [facility] surgery office (if you visited us  there before surgery) Please call our office if you have any  questions or concerns [phone]   Driving Restrictions   Complete by: As directed    No driving until off narcotics and can safely swerve away without pain during an emergency   Increase activity slowly   Complete by: As directed    Lifting restrictions   Complete by: As directed    Avoid heavy lifting initially, <20 pounds at first.   Do not push through pain.   You have no specific weight limit: If it hurts to do, DON'T DO IT.    If you feel no pain, you are not injuring anything.  Pain will protect you from injury.   Coughing and sneezing are far more stressful to your incision than any lifting.   Avoid resuming heavy lifting (>50 pounds) or other intense activity until off all narcotic pain medications.   When want to exercise more, give yourself 2 weeks to gradually get back to full intense exercise/activity.   May shower / Bathe   Complete by: As directed    SHOWER EVERY DAY.  It is fine for dressings or wounds to be washed/rinsed.  Use gentle soap & water .  This will help the incisions and/or wounds get clean & minimize infection.   May walk up steps   Complete by: As directed    Remove dressing in 48 hours   Complete by: As directed    Sexual Activity Restrictions   Complete by: As directed    Sexual activity  as tolerated.  Do not push through pain.  Pain will protect you from injury.   Walk with assistance   Complete by: As directed    Walk over an hour a day.  May use a walker/cane/companion to help with balance and stamina.      Allergies as of 11/27/2023       Reactions   Erythromycin Hives, Nausea Only, Rash        Medication List     TAKE these medications    B-12 5000 MCG Subl Take 5,000 mcg by mouth daily.   CENTRUM SILVER 50+WOMEN PO Take 1 tablet daily by mouth.   CITRACAL +D3 PO Take 1 tablet by mouth daily.   CoQ10 100 MG Caps Take 100 mg by mouth daily.   famotidine -calcium carbonate-magnesium hydroxide 10-800-165 MG chewable  tablet Commonly known as: PEPCID  COMPLETE Chew 1 tablet by mouth at bedtime. Chewable   fexofenadine 180 MG tablet Commonly known as: ALLEGRA Take 180 mg by mouth daily.   Fish Oil Ultra 1400 MG Caps Take 1,400 mg by mouth daily.   levothyroxine 75 MCG tablet Commonly known as: SYNTHROID Take 75 mcg by mouth daily before breakfast.   liothyronine 5 MCG tablet Commonly known as: CYTOMEL Take 10 mcg by mouth daily.   ondansetron  4 MG tablet Commonly known as: ZOFRAN  Take 1 tablet (4 mg total) by mouth every 8 (eight) hours as needed for nausea.   pantoprazole  20 MG tablet Commonly known as: PROTONIX  Take 2 tablets (40 mg total) by mouth daily for 60 days, THEN 1 tablet (20 mg total) daily for 30 days, THEN 1 tablet (20 mg total) every other day. Start taking on: Jun 10, 2023   Polyethyl Glycol-Propyl Glycol 0.4-0.3 % Soln Place 1 drop into both eyes 4 (four) times daily as needed (Dry eyes).   progesterone  100 MG capsule Commonly known as: PROMETRIUM  Take 200 mg by mouth at bedtime.   Restasis 0.05 % ophthalmic emulsion Generic drug: cycloSPORINE Place 1 drop into both eyes 2 (two) times daily.   traMADol  50 MG tablet Commonly known as: ULTRAM  Take 1-2 tablets (50-100 mg total) by mouth every 6 (six) hours as needed for moderate pain (pain score 4-6) or severe pain (pain score 7-10).   TURMERIC PO Take 2,000 mg by mouth daily.   Vitamin D -3 125 MCG (5000 UT) Tabs Take 5,000 Units by mouth daily.        Follow-up Information     Sheldon Standing, MD Follow up on 12/16/2023.   Specialties: General Surgery, Colon and Rectal Surgery Contact information: [address] [place] [address] [phone]                 Signed: Bernarda JAYSON Ned 11/27/2023, 8:30 AM

## 2023-11-29 ENCOUNTER — Emergency Department (HOSPITAL_COMMUNITY)
Admission: EM | Admit: 2023-11-29 | Discharge: 2023-11-29 | Discharge status: Against Medical Advice | Source: Ambulatory Visit | Attending: Emergency Medicine | Admitting: Emergency Medicine

## 2023-11-29 ENCOUNTER — Emergency Department (HOSPITAL_COMMUNITY)

## 2023-11-29 ENCOUNTER — Other Ambulatory Visit: Payer: Self-pay

## 2023-11-29 DIAGNOSIS — Z5321 Procedure and treatment not carried out due to patient leaving prior to being seen by health care provider: Secondary | ICD-10-CM | POA: Insufficient documentation

## 2023-11-29 DIAGNOSIS — R0602 Shortness of breath: Secondary | ICD-10-CM | POA: Diagnosis not present

## 2023-11-29 DIAGNOSIS — I700 Atherosclerosis of aorta: Secondary | ICD-10-CM | POA: Diagnosis not present

## 2023-11-29 DIAGNOSIS — R918 Other nonspecific abnormal finding of lung field: Secondary | ICD-10-CM | POA: Diagnosis not present

## 2023-11-29 LAB — CBC
HCT: 31.9 % — ABNORMAL LOW (ref 36.0–46.0)
Hemoglobin: 10.9 g/dL — ABNORMAL LOW (ref 12.0–15.0)
MCH: 32.9 pg (ref 26.0–34.0)
MCHC: 34.2 g/dL (ref 30.0–36.0)
MCV: 96.4 fL (ref 80.0–100.0)
Platelets: 347 K/uL (ref 150–400)
RBC: 3.31 MIL/uL — ABNORMAL LOW (ref 3.87–5.11)
RDW: 14 % (ref 11.5–15.5)
WBC: 11.9 K/uL — ABNORMAL HIGH (ref 4.0–10.5)
nRBC: 0.7 % — ABNORMAL HIGH (ref 0.0–0.2)

## 2023-11-29 LAB — BASIC METABOLIC PANEL WITH GFR
Anion gap: 12 (ref 5–15)
BUN: 15 mg/dL (ref 8–23)
CO2: 24 mmol/L (ref 22–32)
Calcium: 9.2 mg/dL (ref 8.9–10.3)
Chloride: 104 mmol/L (ref 98–111)
Creatinine, Ser: 0.81 mg/dL (ref 0.44–1.00)
GFR, Estimated: >60 mL/min (ref >60)
Glucose, Bld: 104 mg/dL — ABNORMAL HIGH (ref 70–99)
Potassium: 4 mmol/L (ref 3.5–5.1)
Sodium: 140 mmol/L (ref 135–145)

## 2023-11-29 NOTE — ED Triage Notes (Signed)
 Patient c/o SOB x 4 days. Patient report unable to breath properly d/t surgery last Thursday. Patient denies fever and Chest pain. Hx Post op Hiatal hernia repair 11/13

## 2023-11-29 NOTE — ED Notes (Signed)
 Pt returned her labels to registration and stated she had seen her results on my chart, so pt has called her ride and she is leaving

## 2023-12-03 ENCOUNTER — Ambulatory Visit: Admitting: Family Medicine

## 2023-12-06 ENCOUNTER — Other Ambulatory Visit: Payer: Self-pay | Admitting: Family Medicine

## 2023-12-06 DIAGNOSIS — R7303 Prediabetes: Secondary | ICD-10-CM

## 2023-12-06 DIAGNOSIS — E669 Obesity, unspecified: Secondary | ICD-10-CM

## 2023-12-06 DIAGNOSIS — E7800 Pure hypercholesterolemia, unspecified: Secondary | ICD-10-CM

## 2023-12-06 DIAGNOSIS — M8580 Other specified disorders of bone density and structure, unspecified site: Secondary | ICD-10-CM

## 2023-12-06 DIAGNOSIS — E039 Hypothyroidism, unspecified: Secondary | ICD-10-CM

## 2023-12-08 ENCOUNTER — Other Ambulatory Visit

## 2023-12-08 DIAGNOSIS — R7303 Prediabetes: Secondary | ICD-10-CM

## 2023-12-08 DIAGNOSIS — M3500 Sicca syndrome, unspecified: Secondary | ICD-10-CM

## 2023-12-08 DIAGNOSIS — E039 Hypothyroidism, unspecified: Secondary | ICD-10-CM

## 2023-12-08 DIAGNOSIS — E7800 Pure hypercholesterolemia, unspecified: Secondary | ICD-10-CM

## 2023-12-08 DIAGNOSIS — R5383 Other fatigue: Secondary | ICD-10-CM

## 2023-12-08 DIAGNOSIS — M069 Rheumatoid arthritis, unspecified: Secondary | ICD-10-CM

## 2023-12-08 DIAGNOSIS — E669 Obesity, unspecified: Secondary | ICD-10-CM

## 2023-12-10 LAB — CBC WITH DIFFERENTIAL/PLATELET
Basophils Absolute: 0.1 x10E3/uL (ref 0.0–0.2)
Basos: 1 %
EOS (ABSOLUTE): 0.3 x10E3/uL (ref 0.0–0.4)
Eos: 3 %
Hematocrit: 38.2 % (ref 34.0–46.6)
Hemoglobin: 12.4 g/dL (ref 11.1–15.9)
Immature Grans (Abs): 0 x10E3/uL (ref 0.0–0.1)
Immature Granulocytes: 0 %
Lymphocytes Absolute: 1.6 x10E3/uL (ref 0.7–3.1)
Lymphs: 18 %
MCH: 32 pg (ref 26.6–33.0)
MCHC: 32.5 g/dL (ref 31.5–35.7)
MCV: 99 fL — ABNORMAL HIGH (ref 79–97)
Monocytes Absolute: 0.5 x10E3/uL (ref 0.1–0.9)
Monocytes: 5 %
Neutrophils Absolute: 6.8 x10E3/uL (ref 1.4–7.0)
Neutrophils: 73 %
Platelets: 549 x10E3/uL — ABNORMAL HIGH (ref 150–450)
RBC: 3.88 x10E6/uL (ref 3.77–5.28)
RDW: 13.9 % (ref 11.7–15.4)
WBC: 9.3 x10E3/uL (ref 3.4–10.8)

## 2023-12-10 LAB — HEMOGLOBIN A1C
Est. average glucose Bld gHb Est-mCnc: 114 mg/dL
Hgb A1c MFr Bld: 5.6 % (ref 4.8–5.6)

## 2023-12-10 LAB — PROTEIN ELECTROPHORESIS, SERUM, WITH REFLEX
A/G Ratio: 1.1 (ref 0.7–1.7)
Albumin ELP: 3.4 g/dL (ref 2.9–4.4)
Alpha 1: 0.4 g/dL (ref 0.0–0.4)
Alpha 2: 0.8 g/dL (ref 0.4–1.0)
Beta: 1.1 g/dL (ref 0.7–1.3)
Gamma Globulin: 0.9 g/dL (ref 0.4–1.8)
Globulin, Total: 3.2 g/dL (ref 2.2–3.9)
Total Protein: 6.6 g/dL (ref 6.0–8.5)

## 2023-12-10 LAB — LIPID PANEL
Chol/HDL Ratio: 4.2 ratio (ref 0.0–4.4)
Cholesterol, Total: 211 mg/dL — ABNORMAL HIGH (ref 100–199)
HDL: 50 mg/dL (ref >39)
LDL Chol Calc (NIH): 131 mg/dL — ABNORMAL HIGH (ref 0–99)
Triglycerides: 170 mg/dL — ABNORMAL HIGH (ref 0–149)
VLDL Cholesterol Cal: 30 mg/dL (ref 5–40)

## 2023-12-10 LAB — TSH: TSH: 3.92 u[IU]/mL (ref 0.450–4.500)

## 2023-12-13 ENCOUNTER — Ambulatory Visit: Payer: Self-pay | Admitting: Family Medicine

## 2023-12-15 ENCOUNTER — Encounter (HOSPITAL_COMMUNITY): Payer: Self-pay

## 2023-12-15 ENCOUNTER — Ambulatory Visit: Admitting: Family Medicine

## 2023-12-15 ENCOUNTER — Ambulatory Visit (HOSPITAL_COMMUNITY): Admit: 2023-12-15 | Admitting: Gastroenterology

## 2023-12-15 ENCOUNTER — Encounter: Payer: Self-pay | Admitting: Family Medicine

## 2023-12-15 VITALS — BP 125/78 | HR 63 | Ht 66.0 in | Wt 199.4 lb

## 2023-12-15 DIAGNOSIS — K449 Diaphragmatic hernia without obstruction or gangrene: Secondary | ICD-10-CM | POA: Diagnosis not present

## 2023-12-15 DIAGNOSIS — E039 Hypothyroidism, unspecified: Secondary | ICD-10-CM

## 2023-12-15 DIAGNOSIS — E7800 Pure hypercholesterolemia, unspecified: Secondary | ICD-10-CM

## 2023-12-15 DIAGNOSIS — M3500 Sicca syndrome, unspecified: Secondary | ICD-10-CM

## 2023-12-15 DIAGNOSIS — E669 Obesity, unspecified: Secondary | ICD-10-CM

## 2023-12-15 DIAGNOSIS — Z0000 Encounter for general adult medical examination without abnormal findings: Secondary | ICD-10-CM

## 2023-12-15 SURGERY — MANOMETRY, ESOPHAGUS
Anesthesia: Choice

## 2023-12-15 NOTE — Progress Notes (Signed)
 Annual physical  Subjective    Patient ID: Stacy Boyd, female    DOB: Apr 23, 1955  Age: 68 y.o. MRN: [id number]  Chief Complaint  Patient presents with   Annual Exam   HPI Stacy Boyd is a 68 y.o. old female here  for annual exam.   Subjective - Follow-up post hiatal hernia repair 3 weeks ago. Reports feeling better now than prior to surgery. Progressing well with diet. Had significant difficulty breathing post-operatively, which improved with coughing and clearing lungs. Initially thought coughing would damage the surgical repair. Reports sleeping well now in a bed after nearly two weeks in a recliner. No other acute concerns.  - Follow-up on recent lab results. Labs were largely unremarkable. Cholesterol slightly improved. Thyroid  and blood sugar tests normal. SPEP, ordered by rheumatologist Dr. Leeroy High, was normal.  - Discussion regarding elevated cholesterol. ASCVD risk is 9.4%. Previous cardiac calcium score 3 years ago was zero. Patient not interested in starting a statin at this time. Discussed non-pharmacologic options including CoQ10, alpha-lipoic acid, plant sterols, and soluble fiber.  Medications Currently taking levothyroxine , liothyronine , progesterone , and testosterone  pellets (managed by [facility] for hormone replacement). Recently discontinued Protonix  and is no longer taking it. Taking Pepcid  at night but questions its necessity. Has stopped most pill-form supplements due to dysphagia post-operatively, but is able to swallow pills one at a time. Taking AG1 powder and turmeric. Was prescribed Zofran  and tramadol  post-operatively, but is no longer taking either.  PMH, PSH, FH, Social Hx PMHx: Hypothyroidism, elevated cholesterol, history of hiatal hernia. Hormone replacement therapy. PSHx: Hiatal hernia repair (3 weeks prior), cholecystectomy, appendectomy. No uterus or ovaries. FHx: Father had a heart attack at age 12. Two of six siblings have diabetes. No  significant family history of early heart disease. Social Hx: Non-smoker.  ROS Constitutional: Denies falls. Reports improved sleep and feeling well. Respiratory: Reports initial post-op dyspnea, now resolved with deep breathing and effective coughing. GI: Denies heartburn since discontinuing Protonix . Reports some dysphagia post-op but is managing. Using Miralax  daily for bowels, as fiber is restricted in current diet phase.    The 10-year ASCVD risk score (Arnett DK, et al., 2019) is: 9.4%  Health Maintenance Due  Topic Date Due   COVID-19 Vaccine (11 - 2025-26 season) 09/13/2023      Objective:     BP 125/78   Pulse 63   Ht 5' 6 (1.676 m)   Wt 199 lb 6.4 oz (90.4 kg)   SpO2 99%   BMI 32.18 kg/m    Physical Exam Gen: alert, oriented HEENT: perrla, eomi, mmm CV: rrr, no murmur Pulm: lctab. No wheeze or crackles.  GI: soft, nbs.  Nontender to palpation MSK: strength equal b/l. Normal gait Ext: no pedal edema Skin: warm and dry, no rashes Psych: pleasant affect.  Spontaneous speech   No results found for any visits on 12/15/23.      Assessment & Plan:   Physical exam, annual  Hiatal hernia Assessment & Plan: Post-operative hiatal hernia repair - Patient is 3 weeks post-op and recovering well. Reports resolution of initial dyspnea and is tolerating diet progression. Has a post-surgical follow-up with surgeon tomorrow. - Continue to advance diet as tolerated and as directed by surgeon. - Follow up with surgeon as planned.   Hypercholesteremia Assessment & Plan: - Mildly elevated cholesterol with a 10-year ASCVD risk of 9.4%. Cardiac calcium score from 3 years ago was 0. Does not wish to start a statin. - Continue diet  and lifestyle modifications. - Discussed supplements including CoQ10, alpha-lipoic acid, plant sterols, and soluble fiber (e.g., Metamucil) when diet allows.   Hypothyroidism, unspecified type Assessment & Plan: - Stable on current  regimen. Managed by [facility]. - Continue current management with [facility].   Sjogren's syndrome, with unspecified organ involvement Assessment & Plan: Currently sees [facility], Dr. Mikel. Not currently on medication.       Return in about 1 year (around 12/14/2024) for physical.    Toribio MARLA Slain, MD

## 2023-12-15 NOTE — Patient Instructions (Signed)
 It was nice to see you today,  We addressed the following topics today: - I will forward your recent lab results to your rheumatologist, Dr. Mikel. - You can ask your surgeon tomorrow if you can discontinue the nightly Pepcid . - I recommend you consider adding supplements like CoQ10, alpha-lipoic acid, plant sterols, and soluble fiber like Metamucil to help manage your cholesterol, once your diet allows for it. - Please schedule a follow-up appointment for one year from now, but do not hesitate to make an appointment sooner if any issues arise.  Have a great day,  Rolan Slain, MD

## 2023-12-19 NOTE — Assessment & Plan Note (Signed)
 Currently sees [facility], Dr. Mikel. Not currently on medication.

## 2023-12-19 NOTE — Assessment & Plan Note (Signed)
-   Stable on current regimen. Managed by [facility]. - Continue current management with [facility].

## 2023-12-19 NOTE — Assessment & Plan Note (Signed)
 Post-operative hiatal hernia repair - Patient is 3 weeks post-op and recovering well. Reports resolution of initial dyspnea and is tolerating diet progression. Has a post-surgical follow-up with surgeon tomorrow. - Continue to advance diet as tolerated and as directed by surgeon. - Follow up with surgeon as planned.

## 2023-12-19 NOTE — Assessment & Plan Note (Signed)
-   Mildly elevated cholesterol with a 10-year ASCVD risk of 9.4%. Cardiac calcium score from 3 years ago was 0. Does not wish to start a statin. - Continue diet and lifestyle modifications. - Discussed supplements including CoQ10, alpha-lipoic acid, plant sterols, and soluble fiber (e.g., Metamucil) when diet allows.

## 2024-03-16 ENCOUNTER — Ambulatory Visit: Payer: PPO

## 2024-12-11 ENCOUNTER — Other Ambulatory Visit

## 2024-12-18 ENCOUNTER — Encounter: Admitting: Family Medicine
